# Patient Record
Sex: Female | Born: 1935
Health system: Southern US, Community
[De-identification: ages and names within clinical notes are randomized; demographics above are authoritative.]

## PROBLEM LIST (undated history)

## (undated) DIAGNOSIS — G43909 Migraine, unspecified, not intractable, without status migrainosus: Secondary | ICD-10-CM

## (undated) DIAGNOSIS — N281 Cyst of kidney, acquired: Secondary | ICD-10-CM

## (undated) DIAGNOSIS — K562 Volvulus: Secondary | ICD-10-CM

## (undated) DIAGNOSIS — F3289 Other specified depressive episodes: Secondary | ICD-10-CM

## (undated) DIAGNOSIS — F329 Major depressive disorder, single episode, unspecified: Secondary | ICD-10-CM

## (undated) DIAGNOSIS — I10 Essential (primary) hypertension: Secondary | ICD-10-CM

## (undated) DIAGNOSIS — I739 Peripheral vascular disease, unspecified: Secondary | ICD-10-CM

## (undated) DIAGNOSIS — J984 Other disorders of lung: Secondary | ICD-10-CM

## (undated) DIAGNOSIS — E785 Hyperlipidemia, unspecified: Secondary | ICD-10-CM

## (undated) DIAGNOSIS — K589 Irritable bowel syndrome without diarrhea: Secondary | ICD-10-CM

## (undated) DIAGNOSIS — I2510 Atherosclerotic heart disease of native coronary artery without angina pectoris: Secondary | ICD-10-CM

## (undated) DIAGNOSIS — Z86010 Personal history of colonic polyps: Secondary | ICD-10-CM

## (undated) DIAGNOSIS — N959 Unspecified menopausal and perimenopausal disorder: Secondary | ICD-10-CM

## (undated) DIAGNOSIS — R001 Bradycardia, unspecified: Secondary | ICD-10-CM

## (undated) DIAGNOSIS — K2960 Other gastritis without bleeding: Secondary | ICD-10-CM

## (undated) HISTORY — PX: PTCA: SHX146

## (undated) HISTORY — PX: VAGINAL HYSTERECTOMY: SHX2639

## (undated) HISTORY — DX: Essential (primary) hypertension: I10

## (undated) HISTORY — DX: Other gastritis without bleeding: K29.60

## (undated) HISTORY — PX: ABDOMINAL HYSTERECTOMY: SHX81

## (undated) HISTORY — DX: Personal history of colonic polyps: Z86.010

## (undated) HISTORY — PX: ESOPHAGOGASTRODUODENOSCOPY: SHX1529

## (undated) HISTORY — DX: Peripheral vascular disease, unspecified: I73.9

## (undated) HISTORY — DX: Volvulus: K56.2

## (undated) HISTORY — DX: Migraine, unspecified, not intractable, without status migrainosus: G43.909

## (undated) HISTORY — DX: Major depressive disorder, single episode, unspecified: F32.9

## (undated) HISTORY — DX: Hyperlipidemia, unspecified: E78.5

## (undated) HISTORY — DX: Other disorders of lung: J98.4

## (undated) HISTORY — PX: TONSILLECTOMY: SHX5217

## (undated) HISTORY — PX: TONSILLECTOMY: SUR1361

## (undated) HISTORY — DX: Cyst of kidney, acquired: N28.1

## (undated) HISTORY — DX: Irritable bowel syndrome, unspecified: K58.9

## (undated) HISTORY — DX: Other specified depressive episodes: F32.89

## (undated) HISTORY — DX: Unspecified menopausal and perimenopausal disorder: N95.9

## (undated) HISTORY — PX: COLONOSCOPY: SHX174

---

## 1969-10-12 HISTORY — PX: EXCISION MORTON'S NEUROMA: SHX5013

## 2004-10-12 DIAGNOSIS — Z860100 Personal history of colon polyps, unspecified: Secondary | ICD-10-CM

## 2004-10-12 DIAGNOSIS — K2960 Other gastritis without bleeding: Secondary | ICD-10-CM

## 2004-10-12 HISTORY — DX: Personal history of colon polyps, unspecified: Z86.0100

## 2004-10-12 HISTORY — DX: Other gastritis without bleeding: K29.60

## 2004-10-12 HISTORY — DX: Personal history of colonic polyps: Z86.010

## 2005-08-12 ENCOUNTER — Encounter: Payer: Self-pay | Admitting: Internal Medicine

## 2005-09-11 ENCOUNTER — Encounter: Payer: Self-pay | Admitting: Internal Medicine

## 2006-09-15 ENCOUNTER — Encounter: Payer: Self-pay | Admitting: Internal Medicine

## 2006-10-12 ENCOUNTER — Encounter: Payer: Self-pay | Admitting: Internal Medicine

## 2007-03-23 ENCOUNTER — Encounter: Payer: Self-pay | Admitting: Internal Medicine

## 2007-08-16 ENCOUNTER — Encounter: Payer: Self-pay | Admitting: Internal Medicine

## 2007-12-28 ENCOUNTER — Encounter: Payer: Self-pay | Admitting: Internal Medicine

## 2007-12-28 LAB — CONVERTED CEMR LAB
Eosinophils Relative: 2.3 %
Lymphocytes, automated: 22.2 %
Neutrophils Relative %: 66.6 %
Platelets: 175 10*3/uL
RBC: 4.14 M/uL
RDW: 13.2 %

## 2008-02-01 ENCOUNTER — Encounter: Payer: Self-pay | Admitting: Internal Medicine

## 2008-03-06 ENCOUNTER — Encounter: Payer: Self-pay | Admitting: Cardiovascular Disease

## 2008-03-14 ENCOUNTER — Encounter: Payer: Self-pay | Admitting: Internal Medicine

## 2008-03-30 ENCOUNTER — Encounter: Payer: Self-pay | Admitting: Internal Medicine

## 2008-04-30 ENCOUNTER — Encounter: Payer: Self-pay | Admitting: Internal Medicine

## 2008-04-30 LAB — CONVERTED CEMR LAB
AST: 38 units/L
Alkaline Phosphatase: 80 units/L
BUN: 21 mg/dL
Basophils Relative: 0.5 %
Calcium: 9.2 mg/dL
Creatinine, Ser: 1 mg/dL
Glucose, Bld: 90 mg/dL
Hemoglobin: 13.1 g/dL
Lymphocytes, automated: 26.1 %
Monocytes Relative: 8.4 %
Neutrophils Relative %: 61.9 %
Platelets: 217 10*3/uL
Potassium: 5.3 meq/L
Sodium: 141 meq/L
Total Protein: 7.3 g/dL
WBC: 4.8 10*3/uL

## 2008-05-01 ENCOUNTER — Encounter: Payer: Self-pay | Admitting: Internal Medicine

## 2008-05-15 ENCOUNTER — Encounter: Payer: Self-pay | Admitting: Internal Medicine

## 2008-06-19 ENCOUNTER — Encounter: Payer: Self-pay | Admitting: Internal Medicine

## 2008-08-15 ENCOUNTER — Encounter: Payer: Self-pay | Admitting: Internal Medicine

## 2008-08-15 LAB — CONVERTED CEMR LAB
ALT: 36 units/L
AST: 34 units/L
Alkaline Phosphatase: 60 units/L
BUN: 14 mg/dL
Chloride: 100 meq/L
Eosinophils Relative: 0.1 %
Glucose, Bld: 87 mg/dL
HDL: 62 mg/dL
Hemoglobin: 13.6 g/dL
Lymphocytes, automated: 19.9 %
Monocytes Relative: 8.4 %
Potassium: 4.4 meq/L
RDW: 12.5 %
Total Bilirubin: 0.6 mg/dL
Triglyceride fasting, serum: 181 mg/dL
WBC: 6.2 10*3/uL

## 2008-08-22 ENCOUNTER — Encounter: Payer: Self-pay | Admitting: Internal Medicine

## 2008-09-25 ENCOUNTER — Encounter: Payer: Self-pay | Admitting: Internal Medicine

## 2008-10-17 ENCOUNTER — Encounter: Payer: Self-pay | Admitting: Internal Medicine

## 2008-11-06 ENCOUNTER — Encounter: Payer: Self-pay | Admitting: Internal Medicine

## 2009-03-27 ENCOUNTER — Encounter: Payer: Self-pay | Admitting: Internal Medicine

## 2009-04-01 ENCOUNTER — Encounter: Payer: Self-pay | Admitting: Internal Medicine

## 2009-04-17 ENCOUNTER — Encounter: Payer: Self-pay | Admitting: Internal Medicine

## 2009-04-17 LAB — CONVERTED CEMR LAB
ALT: 37 units/L
AST: 35 units/L
Chloride: 100 meq/L
Cholesterol: 189 mg/dL
HDL: 92 mg/dL
LDL Cholesterol: 81 mg/dL
Lymphocytes, automated: 15.2 %
MCV: 86.5 fL
Monocytes Relative: 6.5 %
Neutrophils Relative %: 76.2 %
Platelets: 147 10*3/uL
Potassium: 4.3 meq/L
RDW: 12.4 %
Sodium: 140 meq/L

## 2009-07-24 ENCOUNTER — Encounter: Payer: Self-pay | Admitting: Cardiovascular Disease

## 2009-09-20 DIAGNOSIS — R9431 Abnormal electrocardiogram [ECG] [EKG]: Secondary | ICD-10-CM | POA: Insufficient documentation

## 2009-09-20 DIAGNOSIS — E785 Hyperlipidemia, unspecified: Secondary | ICD-10-CM | POA: Insufficient documentation

## 2009-09-20 DIAGNOSIS — I10 Essential (primary) hypertension: Secondary | ICD-10-CM | POA: Insufficient documentation

## 2009-09-23 ENCOUNTER — Ambulatory Visit: Payer: Self-pay | Admitting: Cardiovascular Disease

## 2009-09-23 DIAGNOSIS — R011 Cardiac murmur, unspecified: Secondary | ICD-10-CM | POA: Insufficient documentation

## 2009-09-23 DIAGNOSIS — M79609 Pain in unspecified limb: Secondary | ICD-10-CM | POA: Insufficient documentation

## 2009-10-09 ENCOUNTER — Encounter: Payer: Self-pay | Admitting: Cardiovascular Disease

## 2009-10-09 ENCOUNTER — Ambulatory Visit: Payer: Self-pay

## 2009-10-09 ENCOUNTER — Ambulatory Visit: Payer: Self-pay | Admitting: Cardiovascular Disease

## 2009-10-09 ENCOUNTER — Ambulatory Visit (HOSPITAL_COMMUNITY): Admission: RE | Admit: 2009-10-09 | Discharge: 2009-10-09 | Payer: Self-pay | Admitting: Cardiovascular Disease

## 2009-10-21 ENCOUNTER — Telehealth: Payer: Self-pay | Admitting: Cardiovascular Disease

## 2009-10-23 LAB — CONVERTED CEMR LAB
ALT: 48 units/L — ABNORMAL HIGH (ref 0–35)
AST: 39 units/L — ABNORMAL HIGH (ref 0–37)
Albumin: 3.9 g/dL (ref 3.5–5.2)
Alkaline Phosphatase: 44 units/L (ref 39–117)
CO2: 31 meq/L (ref 19–32)
Calcium: 9.2 mg/dL (ref 8.4–10.5)
Chloride: 102 meq/L (ref 96–112)
Glucose, Bld: 80 mg/dL (ref 70–99)
HDL: 84.6 mg/dL (ref >39.00)
Sodium: 140 meq/L (ref 135–145)
Total Protein: 6.9 g/dL (ref 6.0–8.3)

## 2009-11-12 ENCOUNTER — Ambulatory Visit: Payer: Self-pay | Admitting: Internal Medicine

## 2009-11-12 DIAGNOSIS — K5900 Constipation, unspecified: Secondary | ICD-10-CM | POA: Insufficient documentation

## 2009-11-12 DIAGNOSIS — L02619 Cutaneous abscess of unspecified foot: Secondary | ICD-10-CM | POA: Insufficient documentation

## 2009-11-12 DIAGNOSIS — G43909 Migraine, unspecified, not intractable, without status migrainosus: Secondary | ICD-10-CM | POA: Insufficient documentation

## 2009-11-12 DIAGNOSIS — Z9189 Other specified personal risk factors, not elsewhere classified: Secondary | ICD-10-CM | POA: Insufficient documentation

## 2009-11-12 DIAGNOSIS — N281 Cyst of kidney, acquired: Secondary | ICD-10-CM | POA: Insufficient documentation

## 2009-11-12 DIAGNOSIS — L03039 Cellulitis of unspecified toe: Secondary | ICD-10-CM

## 2009-11-12 LAB — CONVERTED CEMR LAB
Basophils Relative: 0.7 % (ref 0.0–3.0)
CRP, High Sensitivity: 0.3 (ref 0.00–5.00)
Eosinophils Absolute: 0.2 10*3/uL (ref 0.0–0.7)
Eosinophils Relative: 2.5 % (ref 0.0–5.0)
Hemoglobin: 12.7 g/dL (ref 12.0–15.0)
Lymphocytes Relative: 15.7 % (ref 12.0–46.0)
MCHC: 33.4 g/dL (ref 30.0–36.0)
Monocytes Relative: 7 % (ref 3.0–12.0)
Neutro Abs: 5.4 10*3/uL (ref 1.4–7.7)
Neutrophils Relative %: 74.1 % (ref 43.0–77.0)
RBC: 4.03 M/uL (ref 3.87–5.11)
WBC: 7.4 10*3/uL (ref 4.5–10.5)

## 2009-11-21 ENCOUNTER — Telehealth: Payer: Self-pay | Admitting: Internal Medicine

## 2009-11-27 ENCOUNTER — Encounter: Payer: Self-pay | Admitting: Internal Medicine

## 2009-11-27 ENCOUNTER — Encounter: Payer: Self-pay | Admitting: Cardiovascular Disease

## 2009-12-24 ENCOUNTER — Ambulatory Visit: Payer: Self-pay | Admitting: Cardiovascular Disease

## 2009-12-27 DIAGNOSIS — I739 Peripheral vascular disease, unspecified: Secondary | ICD-10-CM | POA: Insufficient documentation

## 2010-02-11 ENCOUNTER — Ambulatory Visit: Payer: Self-pay | Admitting: Internal Medicine

## 2010-02-11 DIAGNOSIS — N959 Unspecified menopausal and perimenopausal disorder: Secondary | ICD-10-CM | POA: Insufficient documentation

## 2010-03-26 ENCOUNTER — Ambulatory Visit: Payer: Self-pay | Admitting: Cardiovascular Disease

## 2010-03-26 DIAGNOSIS — I10 Essential (primary) hypertension: Secondary | ICD-10-CM | POA: Insufficient documentation

## 2010-03-26 DIAGNOSIS — I214 Non-ST elevation (NSTEMI) myocardial infarction: Secondary | ICD-10-CM | POA: Insufficient documentation

## 2010-03-31 ENCOUNTER — Encounter: Payer: Self-pay | Admitting: Cardiovascular Disease

## 2010-03-31 LAB — CONVERTED CEMR LAB
AST: 32 units/L (ref 0–37)
Alkaline Phosphatase: 47 units/L (ref 39–117)
Total Bilirubin: 0.7 mg/dL (ref 0.3–1.2)
Total CHOL/HDL Ratio: 2

## 2010-05-09 ENCOUNTER — Telehealth: Payer: Self-pay | Admitting: Internal Medicine

## 2010-05-22 ENCOUNTER — Telehealth: Payer: Self-pay | Admitting: Internal Medicine

## 2010-08-14 ENCOUNTER — Ambulatory Visit: Payer: Self-pay | Admitting: Internal Medicine

## 2010-08-14 DIAGNOSIS — J984 Other disorders of lung: Secondary | ICD-10-CM | POA: Insufficient documentation

## 2010-08-14 DIAGNOSIS — R490 Dysphonia: Secondary | ICD-10-CM | POA: Insufficient documentation

## 2010-08-14 DIAGNOSIS — R1084 Generalized abdominal pain: Secondary | ICD-10-CM | POA: Insufficient documentation

## 2010-08-15 ENCOUNTER — Encounter: Payer: Self-pay | Admitting: Internal Medicine

## 2010-08-15 ENCOUNTER — Ambulatory Visit: Payer: Self-pay | Admitting: Internal Medicine

## 2010-08-15 ENCOUNTER — Telehealth: Payer: Self-pay | Admitting: Internal Medicine

## 2010-08-15 LAB — CONVERTED CEMR LAB
CO2: 30 meq/L (ref 19–32)
Calcium: 9.3 mg/dL (ref 8.4–10.5)
Chloride: 101 meq/L (ref 96–112)
Glucose, Bld: 87 mg/dL (ref 70–99)
Sodium: 139 meq/L (ref 135–145)

## 2010-08-21 ENCOUNTER — Ambulatory Visit: Payer: Self-pay | Admitting: Cardiology

## 2010-09-11 ENCOUNTER — Encounter: Payer: Self-pay | Admitting: Internal Medicine

## 2010-09-29 ENCOUNTER — Encounter: Payer: Self-pay | Admitting: Internal Medicine

## 2010-10-08 ENCOUNTER — Encounter (INDEPENDENT_AMBULATORY_CARE_PROVIDER_SITE_OTHER): Payer: Self-pay | Admitting: *Deleted

## 2010-10-08 ENCOUNTER — Ambulatory Visit: Payer: Self-pay | Admitting: Internal Medicine

## 2010-10-08 DIAGNOSIS — R159 Full incontinence of feces: Secondary | ICD-10-CM | POA: Insufficient documentation

## 2010-10-08 DIAGNOSIS — Z860100 Personal history of colon polyps, unspecified: Secondary | ICD-10-CM | POA: Insufficient documentation

## 2010-10-08 DIAGNOSIS — Z8711 Personal history of peptic ulcer disease: Secondary | ICD-10-CM | POA: Insufficient documentation

## 2010-10-08 DIAGNOSIS — K649 Unspecified hemorrhoids: Secondary | ICD-10-CM | POA: Insufficient documentation

## 2010-10-08 DIAGNOSIS — K589 Irritable bowel syndrome without diarrhea: Secondary | ICD-10-CM | POA: Insufficient documentation

## 2010-10-23 ENCOUNTER — Telehealth: Payer: Self-pay | Admitting: Internal Medicine

## 2010-10-24 ENCOUNTER — Telehealth: Payer: Self-pay | Admitting: Internal Medicine

## 2010-11-05 ENCOUNTER — Encounter: Payer: Self-pay | Admitting: Internal Medicine

## 2010-11-11 NOTE — Procedures (Signed)
Summary: Colonoscopy/[facility]  Colonoscopy/[facility]   Imported By: Sherian Rein 11/14/2009 12:13:11  _____________________________________________________________________  External Attachment:    Type:   Image     Comment:   External Document

## 2010-11-11 NOTE — Assessment & Plan Note (Signed)
Summary: PER PT NOV APPT--[facility]   Vital Signs:  Patient profile:   75 year old female Height:      67 inches (170.18 cm) Weight:      148.12 pounds (67.33 kg) O2 Sat:      98 % on Room air Temp:     97.9 degrees F (36.61 degrees C) oral Pulse rate:   50 / minute BP sitting:   112 / 70  (right arm) Cuff size:   regular  Vitals Entered By: Orlan Leavens RMA (August 14, 2010 8:59 AM)  O2 Flow:  Room air CC: 4 month follow-up Is Patient Diabetic? No Pain Assessment Patient in pain? no      Comments Pt want to discuss Protonix she states not working   Primary Care Provider:  Newt Lukes MD  CC:  4 month follow-up.  History of Present Illness: here for f/u  HTN - reports compliance with ongoing medical treatment and no changes in medication dose or frequency. denies adverse side effects related to current therapy.   dyslipidemia - reports compliance with ongoing medical treatment and no changes in medication dose or frequency. denies adverse side effects related to current therapy. due for reck 03/2010 with cards for labs  postmenopausal syndrome- cont hot flashes, trying again "wean off" premarin - denies vaginal dryness or pain as primary issue - has been on premarin >23yrs for same - plans to not get refills to force herself to stop  c/o inc gas and need to f/u with GI (not done since in [place], prior GI notes reviewed from [place]) - occ "twist" sensation in RUQ - 1-2x/month - none now - no weight loss, no vomitting or change in bowels  c/o voice change - denies allg symptoms or vocal strain - symptoms worse in AM, normal voice by midday - +hx tobacco and would like ENT eval  also would like f/u lung spot - hx CT chest to look at "scar tissue" - would like repeat test - prior smoker   Preventive Screening-Counseling & Management  Alcohol-Tobacco     Alcohol drinks/day: <1     Alcohol Counseling: not indicated; use of alcohol is not excessive or problematic  Smoking Status: quit     Year Quit: 1990  Clinical Review Panels:  Lipid Management   Cholesterol:  179 (03/26/2010)   LDL (bad choesterol):  89 (03/26/2010)   HDL (good cholesterol):  77.80 (03/26/2010)   Triglycerides:  78 (04/17/2009)  CBC   WBC:  7.4 (11/12/2009)   RBC:  4.03 (11/12/2009)   Hgb:  12.7 (11/12/2009)   Hct:  38.1 (11/12/2009)   Platelets:  203.0 (11/12/2009)   MCV  94.3 (11/12/2009)   MCHC  33.4 (11/12/2009)   RDW  12.7 (11/12/2009)   PMN:  74.1 (11/12/2009)   Lymphs:  15.7 (11/12/2009)   Monos:  7.0 (11/12/2009)   Eosinophils:  2.5 (11/12/2009)   Basophil:  0.7 (11/12/2009)  Complete Metabolic Panel   Glucose:  80 (10/09/2009)   Sodium:  140 (10/09/2009)   Potassium:  4.0 (10/09/2009)   Chloride:  102 (10/09/2009)   CO2:  31 (10/09/2009)   BUN:  13 (10/09/2009)   Creatinine:  0.9 (10/09/2009)   Albumin:  3.9 (03/26/2010)   Total Protein:  6.6 (03/26/2010)   Calcium:  9.2 (10/09/2009)   Total Bili:  0.7 (03/26/2010)   Alk Phos:  47 (03/26/2010)   SGPT (ALT):  27 (03/26/2010)   SGOT (AST):  32 (03/26/2010)  Current Medications (verified): 1)  Simvastatin 40 Mg Tabs (Simvastatin) .... Take One Tablet By Mouth Daily At Bedtime 2)  Celexa 20 Mg Tabs (Citalopram Hydrobromide) .... Take 1 Tablet By Mouth Once A Day 3)  Protonix 40 Mg Tbec (Pantoprazole Sodium) .... Take 1 Tablet By Mouth Two Times A Day 4)  Aspirin 81 Mg Tbec (Aspirin) .... Take 1 Tablet By Mouth Two Times A Day 5)  Align  Caps (Probiotic Product) .... Take 1 Tablet By Mouth Once A Day 6)  Vitamin D3 1000 Unit Tabs (Cholecalciferol) .... Take 1 Tablet By Mouth Once A Day 7)  Folic Acid 800 Mcg Tabs (Folic Acid) .... Take 1 Tablet By Mouth Once A Day 8)  Zinc 75mg  .... Two Times A Day 9)  Fish Oil 1000 Mg Caps (Omega-3 Fatty Acids) .... Take 1 Capsule By Mouth Two Times A Day 10)  Caltrate 600 1500 Mg Tabs (Calcium Carbonate) .... Two Times A Day 11)  Vitamin C 1000 Mg Tabs (Ascorbic  Acid) .... Take 1 Tablet By Mouth Two Times A Day 12)  Losartan Potassium 50 Mg Tabs (Losartan Potassium) .... Take One Tablet By Mouth Daily 13)  Premarin 0.625 Mg Tabs (Estrogens Conjugated) .Marland Kitchen.. 1 By Mouth Once Daily  Allergies (verified): 1)  ! Codeine  Past History:  Past Medical History: hypertension dyslipidemia Colonic polyps, hx of migraines postmenopausal syndrome  MD roster: cards - Excell Seltzer podiatry - t[facility]man  Review of Systems  The patient denies anorexia, fever, weight loss, chest pain, syncope, and headaches.    Physical Exam  General:  alert, well-developed, well-nourished, and cooperative to examination.    Lungs:  normal respiratory effort, no intercostal retractions or use of accessory muscles; normal breath sounds bilaterally - no crackles and no wheezes.    Heart:  normal rate, regular rhythm, no murmur, and no rub. BLE without edema.  Abdomen:  soft, non-tender, normal bowel sounds, no distention; no masses and no appreciable hepatomegaly or splenomegaly.     Impression & Recommendations:  Problem # 1:  PULMONARY NODULE (ICD-518.89)  hx same since 2008 - reports 3 prior contrast scans w/o change = "scar tissue" no records available on this at this time - may need to recontact prior PCP - chest ct w/ cm ordered now at pt request given hx tobacco abuse-   Orders: Misc. Referral (Misc. Ref)  Problem # 2:  HOARSENESS (GUY-403.47)  tx for poss seasonal allg and refer to ENT at pt request due to smoking hx  Orders: ENT Referral (ENT)  Problem # 3:  ABDOMINAL PAIN, GENERALIZED (ICD-789.07)  prior GI eval in [place] reviewed in EMR records today -  refer for GI f/u now - ?IBS vs other no med changes rec  Orders: Gastroenterology Referral (GI)  Problem # 4:  ESSENTIAL HYPERTENSION, BENIGN (ICD-401.1)  Her updated medication list for this problem includes:    Losartan Potassium 50 Mg Tabs (Losartan potassium) .Marland Kitchen... Take one tablet by mouth  daily  BP today: 112/70 Prior BP: 110/70 (02/11/2010)  Labs Reviewed: K+: 4.0 (10/09/2009) Creat: : 0.9 (10/09/2009)   Chol: 179 (03/26/2010)   HDL: 77.80 (03/26/2010)   LDL: 89 (03/26/2010)   TG: 63.0 (03/26/2010)  Problem # 5:  POSTMENOPAUSAL SYNDROME (ICD-627.9)  Her updated medication list for this problem includes:    Premarin 0.625 Mg Tabs (Estrogens conjugated) .Marland Kitchen... 1/2-1 twice weekly (or as directed)   Discussed treatment options - cont wean premarin as ongoing -   Problem # 6:  HYPERLIPIDEMIA (ICD-272.4)  Her updated medication list for this problem includes:    Simvastatin 40 Mg Tabs (Simvastatin) .Marland Kitchen... Take one tablet by mouth daily at bedtime  s/p cards eval 03/2010 for same - excellent - cont same and f/u annually  Labs Reviewed: SGOT: 32 (03/26/2010)   SGPT: 27 (03/26/2010)   HDL:77.80 (03/26/2010), 84.60 (10/09/2009)  LDL:89 (03/26/2010), 68 (10/09/2009)  Chol:179 (03/26/2010), 166 (10/09/2009)  Trig:63.0 (03/26/2010), 67.0 (10/09/2009)  Complete Medication List: 1)  Simvastatin 40 Mg Tabs (Simvastatin) .... Take one tablet by mouth daily at bedtime 2)  Celexa 20 Mg Tabs (Citalopram hydrobromide) .... Take 1 tablet by mouth once a day 3)  Protonix 40 Mg Tbec (Pantoprazole sodium) .... Take 1 tablet by mouth two times a day 4)  Aspirin 81 Mg Tbec (Aspirin) .... Take 1 tablet by mouth two times a day 5)  Align Caps (Probiotic product) .... Take 1 tablet by mouth once a day 6)  Vitamin D3 1000 Unit Tabs (Cholecalciferol) .... Take 1 tablet by mouth once a day 7)  Folic Acid 800 Mcg Tabs (Folic acid) .... Take 1 tablet by mouth once a day 8)  Zinc 75mg   .... Two times a day 9)  Fish Oil 1000 Mg Caps (Omega-3 fatty acids) .... Take 1 capsule by mouth two times a day 10)  Caltrate 600 1500 Mg Tabs (Calcium carbonate) .... Two times a day 11)  Vitamin C 1000 Mg Tabs (Ascorbic acid) .... Take 1 tablet by mouth two times a day 12)  Losartan Potassium 50 Mg Tabs (Losartan  potassium) .... Take one tablet by mouth daily 13)  Premarin 0.625 Mg Tabs (Estrogens conjugated) .... 1/2-1 twice weekly (or as directed) 14)  Claritin 10 Mg Tabs (Loratadine) .Marland Kitchen.. 1 by mouth once daily x 2 weeks (then as needed for allergy and voice symptoms)  Other Orders: Radiology Referral (Radiology)  Patient Instructions: 1)  it was good to see you today. 2)  good luck weaning off premarin as discussed 3)  try claritin (or generic) for voice changes and will refer to ENT 4)  also we'll make referral to GI, for mammogram and for CT chest with contrast. Our office will contact you regarding these appointments once made.  5)  if medication refills needed before your next visit, call and we can prescribe as needed  6)  Please schedule a follow-up appointment in 4-6 months to monitor blood pressure and other issues, call sooner if problems.    Orders Added: 1)  Est. Patient Level IV [[address]109] 2)  Gastroenterology Referral [GI] 3)  ENT Referral [ENT] 4)  Misc. Referral [Misc. Ref] 5)  Radiology Referral [Radiology]   Immunization History:  Influenza Immunization History:    Influenza:  historical (07/12/2010)   Immunization History:  Influenza Immunization History:    Influenza:  Historical (07/12/2010)

## 2010-11-11 NOTE — Progress Notes (Signed)
Summary: Infection  Phone Note Call from Patient Call back at Home Phone [phone]   Caller: Patient Summary of Call: pt called stating that ABX for infected toe has helped but the infection is not entirely gone. Pt is requesting another ABX or a possible referral to Podiatry Initial call taken by: Margaret Pyle, CMA,  November 21, 2009 9:12 AM  Follow-up for Phone Call        ok to given another presciption refill on augmentin - will also make referral to podiatry - done Follow-up by: Newt Lukes MD,  November 21, 2009 9:48 AM  Additional Follow-up for Phone Call Additional follow up Details #1::        pt made aware of Rx and referral Additional Follow-up by: Margaret Pyle, CMA,  November 21, 2009 10:00 AM    Prescriptions: AUGMENTIN 875-125 MG TABS (AMOXICILLIN-POT CLAVULANATE) 1 by mouth two times a day x 10 days  #20 x 0   Entered by:   Margaret Pyle, CMA   Authorized by:   Newt Lukes MD   Signed by:   Margaret Pyle, CMA on 11/21/2009   Method used:   Electronically to        [facility] [address] [phone]* (retail)       [address]       [place], [place]  [address]       Ph: [phone] or [phone]       Fax: [phone]   RxID:   [id number]

## 2010-11-11 NOTE — Progress Notes (Signed)
Summary: needs labs for CT w/ contrast  ---- Converted from flag ---- ---- 08/15/2010 10:40 AM, Dagoberto Reef wrote: This pt need labs for CT.  Thanks ------------------------------  lucy - please place in IDX BMet (401.9) and notify pt of same - thanks Newt Lukes MD  August 15, 2010 11:24 AM       Additional Follow-up for Phone Call Additional follow up Details #2::    tried to contact pt left msg with work for her to RTC. Put order in IDX Follow-up by: Orlan Leavens RMA,  August 15, 2010 11:57 AM  Additional Follow-up for Phone Call Additional follow up Details #3:: Details for Additional Follow-up Action Taken: Pt return call back let her know need labs done prior to ct labs already in computer. Additional Follow-up by: Orlan Leavens RMA,  August 15, 2010 1:08 PM

## 2010-11-11 NOTE — Consult Note (Signed)
Summary: [facility]  [facility]   Imported By: Lester Ong 09/16/2010 09:10:31  _____________________________________________________________________  External Attachment:    Type:   Image     Comment:   External Document

## 2010-11-11 NOTE — Letter (Signed)
Summary: [facility]  [facility]   Imported By: Sherian Rein 11/14/2009 12:15:22  _____________________________________________________________________  External Attachment:    Type:   Image     Comment:   External Document

## 2010-11-11 NOTE — Letter (Signed)
Summary: Custom - Lipid  [facility], Main Office  [address]   [place], [place] [address]   Phone: [phone]  Fax: [phone]     March 31, 2010 MRN: [id number]   [facility] [address] [place], [place]  [address]   Dear Ms. [facility],  We have reviewed your cholesterol results.  They are as follows:     Total Cholesterol:    179 (Desirable: less than 200)       HDL  Cholesterol:     77.80  (Desirable: greater than 40 for men and 50 for women)       LDL Cholesterol:       89  (Desirable: less than 100 for low risk and less than 70 for moderate to high risk)       Triglycerides:       63.0  (Desirable: less than 150)  Our recommendations include: Cholesterol numbers and liver function are excellent.  You will need repeat labs in one year. Continue your current medications.   Call our office at the number listed above if you have any questions.  Lowering your LDL cholesterol is important, but it is only one of a large number of "risk factors" that may indicate that you are at risk for heart disease, stroke or other complications of hardening of the arteries.  Other risk factors include:   A.  Cigarette Smoking* B.  High Blood Pressure* C.  Obesity* D.   Low HDL Cholesterol (see yours above)* E.   Diabetes Mellitus (higher risk if your is uncontrolled) F.  Family history of premature heart disease G.  Previous history of stroke or cardiovascular disease    *These are risk factors YOU HAVE CONTROL OVER.  For more information, visit .  There is now evidence that lowering the TOTAL CHOLESTEROL AND LDL CHOLESTEROL can reduce the risk of heart disease.  The American Heart Association recommends the following guidelines for the treatment of elevated cholesterol:  1.  If there is now current heart disease and less than two risk factors, TOTAL CHOLESTEROL should be less than 200 and LDL CHOLESTEROL should be less than 100. 2.  If there is  current heart disease or two or more risk factors, TOTAL CHOLESTEROL should be less than 200 and LDL CHOLESTEROL should be less than 70.  A diet low in cholesterol, saturated fat, and calories is the cornerstone of treatment for elevated cholesterol.  Cessation of smoking and exercise are also important in the management of elevated cholesterol and preventing vascular disease.  Studies have shown that 30 to 60 minutes of physical activity most days can help lower blood pressure, lower cholesterol, and keep your weight at a healthy level.  Drug therapy is used when cholesterol levels do not respond to therapeutic lifestyle changes (smoking cessation, diet, and exercise) and remains unacceptably high.  If medication is started, it is important to have you levels checked periodically to evaluate the need for further treatment options.  Thank you,  Julieta Gutting,  RN-BSN [facility]

## 2010-11-11 NOTE — Progress Notes (Signed)
Summary: Results  Phone Note Call from Patient Call back at Home Phone [phone]   Caller: Patient Reason for Call: Talk to Nurse Summary of Call: returning call, would rather speak to Lauren, aware Lauren is out of ofc today Initial call taken by: Migdalia Dk,  October 21, 2009 2:58 PM  Follow-up for Phone Call        Left message to call back. Julieta Gutting, RN, BSN  October 23, 2009 11:13 AM  The pt called back and I gave her the results of Echo, LEA and bloodwork.  The pt requested a copy of her labs be mailed to her home.  The pt also wanted Dr Excell Seltzer to change her cholesterol medication due to cost.  The pt is currently taking Lipitor 20mg  and would like to change to a generic statin.  I will send this message to Dr Excell Seltzer for review. Follow-up by: Julieta Gutting, RN, BSN,  October 23, 2009 11:43 AM  Additional Follow-up for Phone Call Additional follow up Details #1::        please change to simvastatin 40 mg and f/u lipids/lft's 12 wks, thx Additional Follow-up by: Norva Karvonen, MD,  October 23, 2009 3:48 PM    Additional Follow-up for Phone Call Additional follow up Details #2::    I spoke with the pt and made her aware that Dr Excell Seltzer recommended having her change to Simvastatin 40mg .  Rx was sent to [facility].  I will schedule the pt for follow-up bloodwork at her next appt. Follow-up by: Julieta Gutting, RN, BSN,  October 24, 2009 11:26 AM  New/Updated Medications: SIMVASTATIN 40 MG TABS (SIMVASTATIN) Take one tablet by mouth daily at bedtime Prescriptions: SIMVASTATIN 40 MG TABS (SIMVASTATIN) Take one tablet by mouth daily at bedtime  #90 x 4   Entered by:   Julieta Gutting, RN, BSN   Authorized by:   Norva Karvonen, MD   Signed by:   Julieta Gutting, RN, BSN on 10/24/2009   Method used:   Electronically to        [facility]* (mail-order)             ,          Ph: [phone]       Fax: [phone]   RxID:   [id number]

## 2010-11-11 NOTE — Letter (Signed)
Summary: [facility]  [facility]   Imported By: Sherian Rein 11/14/2009 12:14:09  _____________________________________________________________________  External Attachment:    Type:   Image     Comment:   External Document

## 2010-11-11 NOTE — Procedures (Signed)
Summary: EGD/[facility]  EGD/[facility]   Imported By: Sherian Rein 11/14/2009 12:04:35  _____________________________________________________________________  External Attachment:    Type:   Image     Comment:   External Document

## 2010-11-11 NOTE — Procedures (Signed)
Summary: EGD/[facility]  EGD/[facility]   Imported By: Sherian Rein 11/14/2009 12:07:43  _____________________________________________________________________  External Attachment:    Type:   Image     Comment:   External Document

## 2010-11-11 NOTE — Assessment & Plan Note (Signed)
Summary: 3 month rov   Visit Type:  Follow-up Primary Provider:  Newt Lukes MD  CC:  Pt. is feeling better now.  History of Present Illness: This is a 75 year old woman, seen for follow-up of foot pain and purple toes. She complains of longstanding toe coolness and discoloration with cold exposure. She has had a pins and needles sensation in her feet as well. She is active with regular exercise and denies claudication symptoms. No edema or other complaints.  She has no history of cardiac problems. She denies chest pain, dyspnea, lightheadedness, palpations, orthopnea, or edema. She was started on Losartan at her last for visit for treatment of HTN and is tolerating this well.  Current Medications (verified): 1)  Simvastatin 40 Mg Tabs (Simvastatin) .... Take One Tablet By Mouth Daily At Bedtime 2)  Celexa 20 Mg Tabs (Citalopram Hydrobromide) .... Take 1 Tablet By Mouth Once A Day 3)  Protonix 40 Mg Tbec (Pantoprazole Sodium) .... Take 1 Tablet By Mouth Two Times A Day 4)  Aspirin 81 Mg Tbec (Aspirin) .... Take 1 Tablet By Mouth Two Times A Day 5)  Align  Caps (Probiotic Product) .... Take 1 Tablet By Mouth Once A Day 6)  Vitamin D3 1000 Unit Tabs (Cholecalciferol) .... Take 1 Tablet By Mouth Once A Day 7)  Folic Acid 800 Mcg Tabs (Folic Acid) .... Take 1 Tablet By Mouth Once A Day 8)  Zinc 75mg  .... Two Times A Day 9)  Fish Oil 1000 Mg Caps (Omega-3 Fatty Acids) .... Take 1 Capsule By Mouth Two Times A Day 10)  Caltrate 600 1500 Mg Tabs (Calcium Carbonate) .... Two Times A Day 11)  Vitamin C 1000 Mg Tabs (Ascorbic Acid) .... Take 1 Tablet By Mouth Two Times A Day 12)  Losartan Potassium 50 Mg Tabs (Losartan Potassium) .... Take One Tablet By Mouth Daily  Allergies: 1)  ! Codeine  Past History:  Past medical history reviewed for relevance to current acute and chronic problems.  Past Medical History: Reviewed history from 11/12/2009 and no changes  required. hypertension dyslipidemia Colonic polyps, hx of migraines  MD rooster: cards - Angeni Chaudhuri  Review of Systems       Negative except as per HPI   Vital Signs:  Patient profile:   75 year old female Height:      67 inches Weight:      145.50 pounds BMI:     22.87 Pulse rate:   60 / minute Pulse rhythm:   regular Resp:     18 per minute BP sitting:   110 / 68  (left arm) Cuff size:   large  Vitals Entered By: Vikki Ports (December 24, 2009 9:34 AM)  Serial Vital Signs/Assessments:  Time      Position  BP       Pulse  Resp  Temp     By           R Arm     110/70                         Vikki Ports   Physical Exam  General:  Pt is alert and oriented, very pleasant woman, in no acute distress. HEENT: normal Neck: normal carotid upstrokes without bruits, JVP normal Lungs: CTA CV: RRR without murmur or gallop Abd: soft, NT, positive BS, no bruit, no organomegaly Ext: peripheral pulses 2+ and equal, no edema, toes are cool, distal feet purplish discoloration Skin:  warm and dry without rash    ABI's  Procedure date:  10/09/2009  Findings:      ABI's and TBI's normal bilaterally  Impression & Recommendations:  Problem # 1:  PVD (ICD-443.9) ABI's and TBI's reviewed: ratio's and waveforms are normal. The pt has mild acrocyanosis and this has been longstanding. I suspect she has some degreee of small vessel vasospasm. We discussed the option of trying a vasodilating drug for her HTN, which may provide secondary benefit for her feet, but she does not want to switch because she is tolerating Losartan well and her BP is well-controlled. Will continue current Rx and see her back in 12 months.  Problem # 2:  HYPERLIPIDEMIA (ICD-272.4) Lipids excellent.  Due for repeat in June 2011.  Her updated medication list for this problem includes:    Simvastatin 40 Mg Tabs (Simvastatin) .Marland Kitchen... Take one tablet by mouth daily at bedtime  CHOL: 166 (10/09/2009)   LDL: 68  (10/09/2009)   HDL: 84.60 (10/09/2009)   TG: 67.0 (10/09/2009) CRP: 0.30 mg/L (11/12/2009)     Problem # 3:  HYPERTENSION, UNSPECIFIED (ICD-401.9) Well-controlled on losartan. BMET stable.  Her updated medication list for this problem includes:    Aspirin 81 Mg Tbec (Aspirin) .Marland Kitchen... Take 1 tablet by mouth two times a day    Losartan Potassium 50 Mg Tabs (Losartan potassium) .Marland Kitchen... Take one tablet by mouth daily  BP today: 110/68 Prior BP: 124/82 (11/12/2009)  Labs Reviewed: K+: 4.0 (10/09/2009) Creat: : 0.9 (10/09/2009)   Chol: 166 (10/09/2009)   HDL: 84.60 (10/09/2009)   LDL: 68 (10/09/2009)   TG: 67.0 (10/09/2009)  Patient Instructions: 1)  Your physician recommends that you return for a FASTING LIPID and LIVER in June 2011  (443.9, 401.1, 272.0) 2)  Your physician wants you to follow-up in:  1 YEAR.  You will receive a reminder letter in the mail two months in advance. If you don't receive a letter, please call our office to schedule the follow-up appointment. 3)  Your physician recommends that you continue on your current medications as directed. Please refer to the Current Medication list given to you today.

## 2010-11-11 NOTE — Procedures (Signed)
Summary: Colonoscopy/[facility]  Colonoscopy/[facility]   Imported By: Sherian Rein 11/14/2009 12:11:06  _____________________________________________________________________  External Attachment:    Type:   Image     Comment:   External Document

## 2010-11-11 NOTE — Progress Notes (Signed)
Summary: REFILL - [facility]  Phone Note Refill Request Message from:  Patient  Refills Requested: Medication #1:  PROTONIX 40 MG TBEC Take 1 tablet by mouth two times a day Ok to go to [facility] supply w/3rfs?  Patient is requesting a call back when complete.  Initial call taken by: Lamar Sprinkles, CMA,  May 09, 2010 11:53 AM  Follow-up for Phone Call        yes - ok to fill 90d as per request Newt Lukes MD  May 09, 2010 12:01 PM   Additional Follow-up for Phone Call Additional follow up Details #1::        lf mess for pt that rx was sent to pharm Additional Follow-up by: Lamar Sprinkles, CMA,  May 09, 2010 1:47 PM    Prescriptions: PROTONIX 40 MG TBEC (PANTOPRAZOLE SODIUM) Take 1 tablet by mouth two times a day  #180 x 3   Entered by:   Lamar Sprinkles, CMA   Authorized by:   Newt Lukes MD   Signed by:   Lamar Sprinkles, CMA on 05/09/2010   Method used:   Faxed to ...       [facility] (mail-order)             , [place]         Ph: [phone]       Fax: [phone]   RxID:   [id number]

## 2010-11-11 NOTE — Letter (Signed)
Summary: [facility]   [facility]   Imported By: Roderic Ovens 02/07/2010 11:32:23  _____________________________________________________________________  External Attachment:    Type:   Image     Comment:   External Document

## 2010-11-11 NOTE — Letter (Signed)
Summary: [facility]  [facility]   Imported By: Sherian Rein 11/14/2009 12:16:15  _____________________________________________________________________  External Attachment:    Type:   Image     Comment:   External Document

## 2010-11-11 NOTE — Progress Notes (Signed)
Summary: celexa  Phone Note Refill Request   Refills Requested: Medication #1:  CELEXA 20 MG TABS Take 1 tablet by mouth once a day REQ TO GO TO [facility]   Initial call taken by: Lamar Sprinkles, CMA,  May 22, 2010 10:25 AM  Follow-up for Phone Call        ok to fill Follow-up by: Newt Lukes MD,  May 22, 2010 10:43 AM    Prescriptions: CELEXA 20 MG TABS (CITALOPRAM HYDROBROMIDE) Take 1 tablet by mouth once a day  #90 x 3   Entered by:   Orlan Leavens RMA   Authorized by:   Newt Lukes MD   Signed by:   Orlan Leavens RMA on 05/22/2010   Method used:   Faxed to ...       [facility] (mail-order)             , [place]         Ph: [phone]       Fax: [phone]   RxID:   [id number]

## 2010-11-11 NOTE — Assessment & Plan Note (Signed)
Summary: NEW / MEDICARE Natale Milch FEE NOTICE MAILED/[facility]   Vital Signs:  Patient profile:   75 year old female Height:      67 inches (170.18 cm) Weight:      147.12 pounds (66.87 kg) O2 Sat:      97 % on Room air Temp:     97.6 degrees F (36.44 degrees C) oral Pulse rate:   57 / minute BP sitting:   124 / 82  (left arm) Cuff size:   regular  Vitals Entered By: Orlan Leavens (November 12, 2009 9:57 AM)  O2 Flow:  Room air CC: New patient/ Have sore on (L) toe want md to look at Is Patient Diabetic? No Pain Assessment Patient in pain? yes     Location: toe (L) foot Type: aching   Primary Care Provider:  Newt Lukes MD  CC:  New patient/ Have sore on (L) toe want md to look at.  History of Present Illness: new pt to me and our division - here to est care  c/o pain and swelling in left 4th toe onset >1 mo ago -  problem precipitated by wearing poor fitting snow boot during christmas day snow -  problem described as "rubbed hot spot" initially, now with red discoloration and swelling of entire toe problem a/w sig pain and throbbing - saw podiatrist (greggory mayer) apprx 2 weeks ago for same - took xray - told bone was ok and problem was microvascular - given ?NSAID pill helped control pain for a while but stopped taking - now painful again denies other joints or digits affected with these symptoms - no fever - no rash constantly cold feet but no numbness   Preventive Screening-Counseling & Management  Alcohol-Tobacco     Alcohol drinks/day: <1     Alcohol Counseling: not indicated; use of alcohol is not excessive or problematic     Smoking Status: quit     Year Quit: 1990  Caffeine-Diet-Exercise     Diet Counseling: not indicated; diet is assessed to be healthy     Does Patient Exercise: yes     Exercise Counseling: not indicated; exercise is adequate     Depression Counseling: not indicated; screening negative for depression  Safety-Violence-Falls     Seat  Belt Use: yes     Firearms in the Home: no firearms in the home     Firearm Counseling: not applicable     Smoke Detectors: yes     Violence in the Home: no risk noted     Violence Counseling: not applicable     Sexual Abuse: no     Sexual Abuse Counseling: n/a     Fall Risk Counseling: not indicated; no significant falls noted  Clinical Review Panels:  Prevention   Last Mammogram:  No specific mammographic evidence of malignancy.   (03/12/2009)   Last Pap Smear:  Interpretation/Result:Negative for intraepithelial Lesion or Malignancy.    (10/12/2006)   Last Colonoscopy:  Done @ Dr. Chrissie Noa brener in [facility] [facility] Results: Polyp, removed (10/12/2008)  Immunizations   Last Pneumovax:  Historical (10/13/2007)  Lipid Management   Cholesterol:  166 (10/09/2009)   LDL (bad choesterol):  68 (10/09/2009)   HDL (good cholesterol):  84.60 (10/09/2009)   Triglycerides:  78 (04/17/2009)  CBC   WBC:  8.0 (04/17/2009)   RBC:  4.42 (04/17/2009)   Hgb:  13.6 (04/17/2009)   Hct:  38.2 (04/17/2009)   Platelets:  147 (04/17/2009)   MCV  86.5 (  04/17/2009)   RDW  12.4 (04/17/2009)   PMN:  76.2 (04/17/2009)   Monos:  6.5 (04/17/2009)   Eosinophils:  1.8 (04/17/2009)   Basophil:  0.4 (04/17/2009)  Complete Metabolic Panel   Glucose:  80 (10/09/2009)   Sodium:  140 (10/09/2009)   Potassium:  4.0 (10/09/2009)   Chloride:  102 (10/09/2009)   CO2:  31 (10/09/2009)   BUN:  13 (10/09/2009)   Creatinine:  0.9 (10/09/2009)   Albumin:  3.9 (10/09/2009)   Total Protein:  6.9 (10/09/2009)   Calcium:  9.2 (10/09/2009)   Total Bili:  1.0 (10/09/2009)   Alk Phos:  44 (10/09/2009)   SGPT (ALT):  48 (10/09/2009)   SGOT (AST):  39 (10/09/2009)   -  Date:  04/17/2009    WBC: 8.0    HGB: 13.6    HCT: 38.2    RBC: 4.42    PLT: 147    MCV: 86.5    RDW: 12.4    Neutrophil: 76.2    Lymphs: 15.2    Monos: 6.5    Eos: 1.8    Basophil: 0.4    Triglycerides: 78  Date:  08/15/2008     WBC: 6.2    HGB: 13.6    HCT: 38.7    RBC: 4.38    MCV: 88.4    RDW: 12.5    Neutrophil: 69.6    Lymphs: 19.9    Monos: 8.4    Eos: 0.1    Basophil: 0.0    Triglycerides: 181    TSH: 1.656 6  Current Medications (verified): 1)  Simvastatin 40 Mg Tabs (Simvastatin) .... Take One Tablet By Mouth Daily At Bedtime 2)  Celexa 20 Mg Tabs (Citalopram Hydrobromide) .... Take 1 Tablet By Mouth Once A Day 3)  Protonix 40 Mg Tbec (Pantoprazole Sodium) .... Take 1 Tablet By Mouth Once A Day 4)  Aspirin 81 Mg Tbec (Aspirin) .... Take One Tablet By Mouth Daily 5)  Align  Caps (Probiotic Product) .... Take 1 Tablet By Mouth Once A Day 6)  Vitamin D3 1000 Unit Tabs (Cholecalciferol) .... Take 1 Tablet By Mouth Once A Day 7)  Folic Acid 800 Mcg Tabs (Folic Acid) .... Take 1 Tablet By Mouth Once A Day 8)  Zinc 75mg  .... Two Times A Day 9)  Fish Oil 1000 Mg Caps (Omega-3 Fatty Acids) .... Take 1 Capsule By Mouth Two Times A Day 10)  Caltrate 600 1500 Mg Tabs (Calcium Carbonate) .... Two Times A Day 11)  Vitamin C 1000 Mg Tabs (Ascorbic Acid) .... Take 1 Tablet By Mouth Two Times A Day 12)  Losartan Potassium 50 Mg Tabs (Losartan Potassium) .... Take One Tablet By Mouth Daily  Allergies (verified): 1)  ! Codeine  Past History:  Past Medical History: hypertension dyslipidemia Colonic polyps, hx of migraines  MD rooster: cards - [facility]  Social History: She quit smoking 20+ years ago.  She drinks occasional alcohol.  She is widowed fsince 2002.  She has 3 grown children and 5 grandchildren.  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 moved to [place] from [place] in 2010 to be close to family Does Patient Exercise:  yes Seat Belt Use:  yes  Review of Systems  The patient denies fever, weight loss, chest pain, syncope, headaches, and abdominal pain.    Physical Exam  General:  alert, well-developed, well-nourished, and cooperative to examination.    Lungs:  normal respiratory  effort, no intercostal retractions or use  of accessory muscles; normal breath sounds bilaterally - no crackles and no wheezes.    Heart:  normal rate, regular rhythm, no murmur, and no rub. BLE without edema.  Msk:  left foot, 4th toe diffusely swollen and mottled with purplish discoloration - very tender with any ROM or palpation - no open ulcerations or broken skin - mildly warm as compared to other digits - cool to touch but good cap refill - other toes normal Skin:  see MSkel re: L 4th toe - otherwise, no rashes, vesicles, ulcers, or erythema. No nodules or irregularity to palpation.  Psych:  Oriented X3, memory intact for recent and remote, normally interactive, good eye contact, not anxious appearing, not depressed appearing, and not agitated.      Impression & Recommendations:  Problem # 1:  CELLULITIS, TOE (ICD-681.10) concerning for skin infection following trauma -  pt reports normal xray done at podiatry < 2weeks ago- thus will not repeat at this time ok to cont NSAID for pain but add Augmentin for probable infection x 10d - check labs now to monitor -  conside eval by ortho/foot if not improving -- recent ABIs show good "macro" flow Her updated medication list for this problem includes:    Augmentin 875-125 Mg Tabs (Amoxicillin-pot clavulanate) .Marland Kitchen... 1 by mouth two times a day x 10 days  Orders: TLB-CBC Platelet - w/Differential (85025-CBCD) TLB-Sedimentation Rate (ESR) (85652-ESR) TLB-CRP-High Sensitivity (C-Reactive Protein) (86140-FCRP) Prescription Created Electronically [phone])  Complete Medication List: 1)  Simvastatin 40 Mg Tabs (Simvastatin) .... Take one tablet by mouth daily at bedtime 2)  Celexa 20 Mg Tabs (Citalopram hydrobromide) .... Take 1 tablet by mouth once a day 3)  Protonix 40 Mg Tbec (Pantoprazole sodium) .... Take 1 tablet by mouth two times a day 4)  Aspirin 81 Mg Tbec (Aspirin) .... Take one tablet by mouth daily 5)  Align Caps (Probiotic product) ....  Take 1 tablet by mouth once a day 6)  Vitamin D3 1000 Unit Tabs (Cholecalciferol) .... Take 1 tablet by mouth once a day 7)  Folic Acid 800 Mcg Tabs (Folic acid) .... Take 1 tablet by mouth once a day 8)  Zinc 75mg   .... Two times a day 9)  Fish Oil 1000 Mg Caps (Omega-3 fatty acids) .... Take 1 capsule by mouth two times a day 10)  Caltrate 600 1500 Mg Tabs (Calcium carbonate) .... Two times a day 11)  Vitamin C 1000 Mg Tabs (Ascorbic acid) .... Take 1 tablet by mouth two times a day 12)  Losartan Potassium 50 Mg Tabs (Losartan potassium) .... Take one tablet by mouth daily 13)  Augmentin 875-125 Mg Tabs (Amoxicillin-pot clavulanate) .Marland Kitchen.. 1 by mouth two times a day x 10 days  Patient Instructions: 1)  it was good to see you today.  2)  test(s) ordered today - your results will be posted on the phone tree for review in 48-72 hours from the time of test completion; call [phone] and enter your 9 digit MRN (listed above on this page, just below your name); if any changes need to be made or there are abnormal results, you will be contacted directly.  3)  antibiotics for your toe - Augmentin - your prescription has been electronically submitted to your pharmacy. Please take as directed. Contact our office if you believe you're having problems with the medication(s).  4)  if toe pain or swelling gets worse, rather than better, please let us know so we can make referral to  orthopedist for further evaluation and treatment 5)  otherwise, please schedule a follow-up appointment in 3 months, sooner if problems.  Prescriptions: AUGMENTIN 875-125 MG TABS (AMOXICILLIN-POT CLAVULANATE) 1 by mouth two times a day x 10 days  #20 x 0   Entered and Authorized by:   Newt Lukes MD   Signed by:   Newt Lukes MD on 11/12/2009   Method used:   Electronically to        [facility] [phone]* (retail)       [address]       [place], [place]  [address]       Ph:  [phone] or [phone]       Fax: [phone]   RxID:   [id number]    Colonoscopy  Procedure date:  10/12/2008  Findings:      Done @ Dr. Chrissie Noa brener in [place] [facility] Results: Polyp, removed   Bone Density  Procedure date:  07/12/2008  Findings:      done @ [facility], Dr. Roger Kill results: Osteopenia  Mammogram  Procedure date:  07/12/2008  Findings:      done @ [facility], [place] [facility] No specific mammographic evidence of malignancy.

## 2010-11-11 NOTE — Letter (Signed)
Summary: [facility]  [facility]   Imported By: Sherian Rein 11/14/2009 10:08:19  _____________________________________________________________________  External Attachment:    Type:   Image     Comment:   External Document

## 2010-11-11 NOTE — Letter (Signed)
Summary: [facility]   Imported By: Sherian Rein 12/09/2009 15:07:59  _____________________________________________________________________  External Attachment:    Type:   Image     Comment:   External Document

## 2010-11-11 NOTE — Letter (Signed)
Summary: New Patient letter  [facility]  [address] [place], [place] [address]   Phone: [phone]  Fax: [phone]       08/15/2010 MRN: [id number]  [facility] [address] [place], [place]  [address]  Dear Ms. [facility],  Welcome to the Gastroenterology Division at [facility].    You are scheduled to see Dr.  Leone Payor on 10-08-10 at [facility] on the 3rd floor at [facility], [address]  We ask that you try to arrive at our office 15 minutes prior to your appointment time to allow for check-in.  We would like you to complete the enclosed self-administered evaluation form prior to your visit and bring it with you on the day of your appointment.  We will review it with you.  Also, please bring a complete list of all your medications or, if you prefer, bring the medication bottles and we will list them.  Please bring your insurance card so that we may make a copy of it.  If your insurance requires a referral to see a specialist, please bring your referral form from your primary care physician.  Co-payments are due at the time of your visit and may be paid by cash, check or credit card.     Your office visit will consist of a consult with your physician (includes a physical exam), any laboratory testing he/she may order, scheduling of any necessary diagnostic testing (e.g. x-ray, ultrasound, CT-scan), and scheduling of a procedure (e.g. Endoscopy, Colonoscopy) if required.  Please allow enough time on your schedule to allow for any/all of these possibilities.    If you cannot keep your appointment, please call [phone] to cancel or reschedule prior to your appointment date.  This allows [place] the opportunity to schedule an appointment for another patient in need of care.  If you do not cancel or reschedule by 5 p.m. the business day prior to your appointment date, you will be charged a $50.00 late cancellation/no-show fee.    Thank you for  choosing [facility] for your medical needs.  We appreciate the opportunity to care for you.  Please visit [place] at our website  to learn more about our practice.                     Sincerely,                                                             The Gastroenterology Division

## 2010-11-11 NOTE — Assessment & Plan Note (Signed)
Summary: 3 MTH FU  STC   Vital Signs:  Patient profile:   75 year old female Height:      67 inches (170.18 cm) Weight:      147.12 pounds (66.87 kg) O2 Sat:      98 % on Room air Temp:     97.9 degrees F (36.61 degrees C) oral Pulse rate:   55 / minute BP sitting:   110 / 70  (left arm) Cuff size:   regular  Vitals Entered By: Orlan Leavens (Feb 11, 2010 9:00 AM)  O2 Flow:  Room air CC: 3 month follow-up Is Patient Diabetic? No Pain Assessment Patient in pain? no        Primary Care Provider:  Newt Lukes MD  CC:  3 month follow-up.  History of Present Illness: 3 mo f/u  f/u issues left 4th toe has seen both cards (PV) and podiatry for same since last OV - not vascular pain and swelling has resolved - remains discolored (min red) compared to other toes  HTN - reports compliance with ongoing medical treatment and no changes in medication dose or frequency. denies adverse side effects related to current therapy.   dyslipidemia - reports compliance with ongoing medical treatment and no changes in medication dose or frequency. denies adverse side effects related to current therapy. due for reck 03/2010 with cards for labs  postmenopausal syndrome- cont hot flashes, has been unable to "wean off" premarin - denies vaginal dryness or pain as primary issue - has been on premarin >71yrs for same and needs refills   Clinical Review Panels:  Lipid Management   Cholesterol:  166 (10/09/2009)   LDL (bad choesterol):  68 (10/09/2009)   HDL (good cholesterol):  84.60 (10/09/2009)   Triglycerides:  78 (04/17/2009)  CBC   WBC:  7.4 (11/12/2009)   RBC:  4.03 (11/12/2009)   Hgb:  12.7 (11/12/2009)   Hct:  38.1 (11/12/2009)   Platelets:  203.0 (11/12/2009)   MCV  94.3 (11/12/2009)   MCHC  33.4 (11/12/2009)   RDW  12.7 (11/12/2009)   PMN:  74.1 (11/12/2009)   Lymphs:  15.7 (11/12/2009)   Monos:  7.0 (11/12/2009)   Eosinophils:  2.5 (11/12/2009)   Basophil:  0.7  (11/12/2009)  Complete Metabolic Panel   Glucose:  80 (10/09/2009)   Sodium:  140 (10/09/2009)   Potassium:  4.0 (10/09/2009)   Chloride:  102 (10/09/2009)   CO2:  31 (10/09/2009)   BUN:  13 (10/09/2009)   Creatinine:  0.9 (10/09/2009)   Albumin:  3.9 (10/09/2009)   Total Protein:  6.9 (10/09/2009)   Calcium:  9.2 (10/09/2009)   Total Bili:  1.0 (10/09/2009)   Alk Phos:  44 (10/09/2009)   SGPT (ALT):  48 (10/09/2009)   SGOT (AST):  39 (10/09/2009)   Current Medications (verified): 1)  Simvastatin 40 Mg Tabs (Simvastatin) .... Take One Tablet By Mouth Daily At Bedtime 2)  Celexa 20 Mg Tabs (Citalopram Hydrobromide) .... Take 1 Tablet By Mouth Once A Day 3)  Protonix 40 Mg Tbec (Pantoprazole Sodium) .... Take 1 Tablet By Mouth Two Times A Day 4)  Aspirin 81 Mg Tbec (Aspirin) .... Take 1 Tablet By Mouth Two Times A Day 5)  Align  Caps (Probiotic Product) .... Take 1 Tablet By Mouth Once A Day 6)  Vitamin D3 1000 Unit Tabs (Cholecalciferol) .... Take 1 Tablet By Mouth Once A Day 7)  Folic Acid 800 Mcg Tabs (Folic Acid) .... Take  1 Tablet By Mouth Once A Day 8)  Zinc 75mg  .... Two Times A Day 9)  Fish Oil 1000 Mg Caps (Omega-3 Fatty Acids) .... Take 1 Capsule By Mouth Two Times A Day 10)  Caltrate 600 1500 Mg Tabs (Calcium Carbonate) .... Two Times A Day 11)  Vitamin C 1000 Mg Tabs (Ascorbic Acid) .... Take 1 Tablet By Mouth Two Times A Day 12)  Losartan Potassium 50 Mg Tabs (Losartan Potassium) .... Take One Tablet By Mouth Daily 13)  Premarin 0.625 Mg Tabs (Estrogens Conjugated) .... Take 1 Po Qd  Allergies (verified): 1)  ! Codeine  Past History:  Past Medical History: hypertension dyslipidemia Colonic polyps, hx of migraines psotmenopausal syndrome  MD rooster: cards - [facility] - [facility]  Review of Systems  The patient denies anorexia, weight loss, chest pain, syncope, abdominal pain, and muscle weakness.    Physical Exam  General:  alert, well-developed,  well-nourished, and cooperative to examination.    Lungs:  normal respiratory effort, no intercostal retractions or use of accessory muscles; normal breath sounds bilaterally - no crackles and no wheezes.    Heart:  normal rate, regular rhythm, no murmur, and no rub. BLE without edema.  Msk:  left foot, 4th toe without swelling, very mild erythema discoloration at distal tip - non tender, FROM - no open ulcerations or broken skin - all toes with good cap refill   Impression & Recommendations:  Problem # 1:  CELLULITIS, TOE (ICD-681.10) Assessment Improved podiatry notes and cards eval reviewed - issue resolved -  Problem # 2:  HYPERTENSION, UNSPECIFIED (ICD-401.9) Assessment: Unchanged  Her updated medication list for this problem includes:    Losartan Potassium 50 Mg Tabs (Losartan potassium) .Marland Kitchen... Take one tablet by mouth daily  BP today: 110/70 Prior BP: 110/68 (12/24/2009)  Labs Reviewed: K+: 4.0 (10/09/2009) Creat: : 0.9 (10/09/2009)   Chol: 166 (10/09/2009)   HDL: 84.60 (10/09/2009)   LDL: 68 (10/09/2009)   TG: 67.0 (10/09/2009)  Problem # 3:  HYPERLIPIDEMIA (ICD-272.4)  Her updated medication list for this problem includes:    Simvastatin 40 Mg Tabs (Simvastatin) .Marland Kitchen... Take one tablet by mouth daily at bedtime  Lipids excellent.  Due for repeat in June 2011 - scheduled with cards.  Her updated medication list for this problem includes:    Simvastatin 40 Mg Tabs (Simvastatin) .Marland Kitchen... Take one tablet by mouth daily at bedtime  CHOL: 166 (10/09/2009)   LDL: 68 (10/09/2009)   HDL: 84.60 (10/09/2009)   TG: 67.0 (10/09/2009) CRP: 0.30 mg/L (11/12/2009)     Problem # 4:  POSTMENOPAUSAL SYNDROME (ICD-627.9)  Her updated medication list for this problem includes:    Premarin 0.625 Mg Tabs (Estrogens conjugated) .Marland Kitchen... 1 by mouth once daily  Discussed treatment options - cont oral premarin as ongoing - new rx provided today.   Time spent with patient 25 minutes, more than 50%  of this time was spent counseling patient on evaluation for toe, effects of current medications and alternative options for tx of hot flashes  Complete Medication List: 1)  Simvastatin 40 Mg Tabs (Simvastatin) .... Take one tablet by mouth daily at bedtime 2)  Celexa 20 Mg Tabs (Citalopram hydrobromide) .... Take 1 tablet by mouth once a day 3)  Protonix 40 Mg Tbec (Pantoprazole sodium) .... Take 1 tablet by mouth two times a day 4)  Aspirin 81 Mg Tbec (Aspirin) .... Take 1 tablet by mouth two times a day 5)  Align Caps (Probiotic product) .Marland KitchenMarland KitchenMarland Kitchen  Take 1 tablet by mouth once a day 6)  Vitamin D3 1000 Unit Tabs (Cholecalciferol) .... Take 1 tablet by mouth once a day 7)  Folic Acid 800 Mcg Tabs (Folic acid) .... Take 1 tablet by mouth once a day 8)  Zinc 75mg   .... Two times a day 9)  Fish Oil 1000 Mg Caps (Omega-3 fatty acids) .... Take 1 capsule by mouth two times a day 10)  Caltrate 600 1500 Mg Tabs (Calcium carbonate) .... Two times a day 11)  Vitamin C 1000 Mg Tabs (Ascorbic acid) .... Take 1 tablet by mouth two times a day 12)  Losartan Potassium 50 Mg Tabs (Losartan potassium) .... Take one tablet by mouth daily 13)  Premarin 0.625 Mg Tabs (Estrogens conjugated) .Marland Kitchen.. 1 by mouth once daily  Patient Instructions: 1)  it was good to see you today. 2)  as there is no good generic for Premarin, will cont same as discussed - prescription faxed to [facility] - 3)  if other medication refills needed before your next visit, call and we can prescribe as needed  4)  Please schedule a follow-up appointment in 4-6 months to monitor blood pressure and other issues, call sooner if problems.  Prescriptions: PREMARIN 0.625 MG TABS (ESTROGENS CONJUGATED) 1 by mouth once daily  #90 x 3   Entered and Authorized by:   Newt Lukes MD   Signed by:   Newt Lukes MD on 02/11/2010   Method used:   Electronically to        [facility] (mail-order)             ,          Ph: [phone]        Fax: [phone]   RxID:   [id number]

## 2010-11-13 NOTE — Miscellaneous (Signed)
Summary: removal of flowsheet oberservations per dottie nelson  Clinical Lists Changes  Observations: Removed observation of COLONOSCOPY: Done @ Dr. Chrissie Noa brener in [place] [facility] Results: Polyp, removed  (10/12/2008 9:59) Removed observation of EGD: Erosive gastritis/ulcers - body and antrum  gastritis on biopsies and no H. pylori (07/02/2008 13:10)

## 2010-11-13 NOTE — Assessment & Plan Note (Signed)
Summary: GENERALIZED ABD PAIN/YF   History of Present Illness Visit Type: Initial Consult Primary GI MD: Stan Head MD [facility] Primary Luvinia Lucy: Newt Lukes MD Requesting Alda Gaultney: Newt Lukes MD Chief Complaint: Rectal bleeding History of Present Illness:   75yo white woman with intermittent lower abdominal discomfort "as if something is twisted" at times and sometimes a pressure under ribs when she bends. These lower abdominal symptoms are brief symptoms and without an abvious trigger. 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  Bloating or gas are chronic especially passing flatus. She has tried diet change and she stopped the Protonix since moving to [place]. She does not notice any problems. Gastritis/ulcers in 2009 were attributed to NSAID's/ASA in 2009 due to shingles.  Has been on Align since 2009 and no changes in gas with that.  She is having rectal bleeding that is painless with slight staining of underwear and toilet paper with bright red blood. She wears a pad. She has some relatively new mild leakage of feces that is soft and liquid. She has some stress urinary incontinence also with progressive worsening. Denies birth trauma. Ovaries remain. Formed bowel movements on MiraLax daily.   GI Review of Systems    Reports bloating.      Denies abdominal pain, acid reflux, belching, chest pain, dysphagia with liquids, dysphagia with solids, heartburn, loss of appetite, nausea, vomiting, vomiting blood, weight loss, and  weight gain.      Reports constipation, irritable bowel syndrome, and  rectal bleeding.     Denies anal fissure, black tarry stools, change in bowel habit, diarrhea, diverticulosis, fecal incontinence, heme positive stool, hemorrhoids, jaundice, light color stool, liver problems, and  rectal pain.    Clinical Reports Reviewed:  EGD:  11/06/2008:  Impression: 1. Normal esophagus 2. Moderately erythematous mucosa found in the antrum. Two  biopsy taken 3. Normal duodenal bulb, 2nd portion of the duodenum, and distal duodenum  07/02/2008:  Erosive gastritis/ulcers - body and antrum  gastritis on biopsies and no H. pylori  05/01/2008:  done in [facility] Impreesion: 1. Normal esophagus 2. there were several gastric antral and body ulcers and erosions with associated erythema, multiple biopsies taken  Colonoscopy:  10/12/2008:  Done @ Dr. Chrissie Noa brener in [facility]. Results: Polyp, removed  02/01/2008:  done in [facility] Results: Internal hemorrhoids  09/11/2005:  done in [facility] Impression: 1. One single colonic polyp 2. Mild melaosis found in the entire colon 3. Internal  hemorrhoids  EGD  Procedure date:  07/02/2008  Findings:      Erosive gastritis/ulcers - body and antrum  gastritis on biopsies and no H. pylori  Procedures Next Due Date:    Colonoscopy: 02/2013   Current Medications (verified): 1)  Simvastatin 40 Mg Tabs (Simvastatin) .... Take One Tablet By Mouth Daily At Bedtime 2)  Celexa 20 Mg Tabs (Citalopram Hydrobromide) .... Take 1 Tablet By Mouth Once A Day 3)  Aspirin 81 Mg Tbec (Aspirin) .... Take 1 Tablet By Mouth Two Times A Day 4)  Align  Caps (Probiotic Product) .... Take 1 Tablet By Mouth Once A Day 5)  Vitamin D3 1000 Unit Tabs (Cholecalciferol) .... Take 1 Tablet By Mouth Once A Day 6)  Folic Acid 800 Mcg Tabs (Folic Acid) .... Take 1 Tablet By Mouth Once A Day 7)  Zinc 75mg  .... Two Times A Day 8)  Fish Oil 1000 Mg Caps (Omega-3 Fatty Acids) .... Take 1 Capsule By Mouth Two Times A  Day 9)  Caltrate 600 1500 Mg Tabs (Calcium Carbonate) .... Two Times A Day 10)  Vitamin C 1000 Mg Tabs (Ascorbic Acid) .... Take 1 Tablet By Mouth Two Times A Day 11)  Losartan Potassium 50 Mg Tabs (Losartan Potassium) .... Take One Tablet By Mouth Daily 12)  Claritin 10 Mg Tabs (Loratadine) .Marland Kitchen.. 1 By Mouth Once Daily X 2 Weeks (Then As Needed For Allergy and Voice  Symptoms)  Allergies: 1)  ! Codeine  Past History:  Past Medical History: hypertension dyslipidemia Colonic polyps, hx of migraines postmenopausal syndrome IBS Erosive gastritis 2009   MD roster: cards - Excell S[facility] podiatry - tuchman  Past Surgical History: Reviewed history from 11/12/2009 and no changes required. Hysterectomy (1971) Tonsillectomy (child)  Family History: Father deceased at the age of 32 from MI. Mother deceased at age 3 from an MI.  She has a brother deceased at age 24 from MI as well. Father as well.  Family History Diabetes 1st degree relative (mother), paternal grandmother No FH of Colon Cancer:  Social History: She quit smoking 20+ years ago.  She drinks occasional alcohol. 1 per day She is widowed since 2002.  She has 3 grown children and 5 grandchildren.  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 moved to [place] from [place] in 2010 to be close to family Daily Caffeine Use 1 per day  Review of Systems       The patient complains of urine leakage and voice change.         All other ROS negative except as per HPI.   Vital Signs:  Patient profile:   75 year old female Height:      67 inches Weight:      149.2 pounds BMI:     23.45 Pulse rate:   60 / minute Pulse rhythm:   regular BP sitting:   112 / 60  (left arm) Cuff size:   regular  Vitals Entered By: Harlow Mares CMA Duncan Dull) (October 08, 2010 8:53 AM)  Physical Exam  General:  Well developed, well nourished, no acute distress. Eyes:  PERRLA, no icterus. Mouth:  No deformity or lesions. Neck:  Supple; no masses or thyromegaly. Lungs:  Clear throughout to auscultation. Heart:  Regular rate and rhythm; no murmurs, rubs,  or bruits. Abdomen:  Soft, nontender and nondistended. No masses, hepatosplenomegaly or hernias noted. Normal bowel sounds. Rectal:  female staff present: normal anoderm intact anal wink slight decrease in resting and voluntary squeeze no abnormal  descent small rectocele soft brown stool ANOSCOPY: swollen hemorrhoids in canal Extremities:  No clubbing, cyanosis, edema or deformities noted. Neurologic:  Alert and  oriented x4;  grossly normal neurologically. Cervical Nodes:  No significant cervical or supraclavicular adenopathy.  Psych:  Alert and cooperative. Normal mood and affect.   Impression & Recommendations:  Problem # 1:  HEMORRHOIDS, WITH BLEEDING (ICD-455.8) Assessment New History and exam support these as a cause of bleeding. have been seen on colonoscopies in 2006 and 2009. NOTE that she did not have a colonoscopy in 2010 as entered into flowsheet, last in 2009. Will treat for these and reassess. Do not think needs more aggressive evaluation at this time.  Problem # 2:  IRRITABLE BOWEL SYNDROME (ICD-564.1) Assessment: New Try gas diet  handout to see if that helps relieve some of this. She was advised it is normal to pass flatus 15-20 times a day. She will stop Align as has been on since 2009 and not  helping (apparently) Monitor abdominal complants - do not sound seious. contiue MiraLax for constipation - considered fiber for incontinence but concerned about aggravating gas complaints  breath testing negative for small bowel bacterial overgrowth in 2010  Problem # 3:  COLONIC POLYPS, ADENOMATOUS, HX OF (ICD-V12.72) colonoscopies in [place] then [place]. polyps in past 2000 or earlier diminutive adenoma removed 09/11/2005 nop polyps 02/01/08 DID NOT HAVE A COLONOSCOPY IN JAN 201 as entered into flowsheet - will see if it can be removed  recall colonoscopy 01/2013 is planned  Problem # 4:  FULL INCONTINENCE OF FECES (ICD-787.60) treat hemorrhoids to see if that helps (? mucoid ooze) Kegel Exercises see GYN also to assess genitalia re: could she need estrogen therapy (topical)  Problem # 5:  PERSONAL HISTORY OF PEPTIC ULCER DISEASE (ICD-V12.71) Assessment: New she had erosive gastritris in 2009 - while  using ASA/NSAID's for shingles pain follow-up EGD negative has been negative for H. pylori on bx twice and serology once   will see how she does off PPI at this time. Not absolutely indicated. Discussed possibility of bleeding. She is not using extra NSAID's, on 162 mg ASA daily.  Patient Instructions: 1)  Please pick up your medications at your pharmacy. 2)  Anusol HC is new treatment. 3)  Use the Kegel exercises and see a gynecologist as discussed. 4)  Read and follow gas handout. 5)  Read IBS handout. 6)  You may stop the Align. 7)  Please schedule a follow-up appointment in 6 to 8 weeks.  8)  Copy sent to : Dr Felicity Coyer 9)  The medication list was reviewed and reconciled.  All changed / newly prescribed medications were explained.  A complete medication list was provided to the patient / caregiver. Prescriptions: HEMORRHOIDAL-HC 25 MG SUPP (HYDROCORTISONE ACETATE) 1 per rectum nightly x 7 nights then as needed  #21 x 0   Entered and Authorized by:   Iva Boop MD, [facility]   Signed by:   Iva Boop MD, [facility] on 10/08/2010   Method used:   Electronically to        [facility] [address] [phone][facility] (retail)       [address]       [place], [place]  [address]       Ph: [phone] or [phone]       Fax: [phone]   RxID:   [id number]   Appended Document: GENERALIZED ABD PAIN/YF  ---- 10/08/2010 1:24 PM, Iva Boop MD, [facility] wrote: 1) Needs IDX colonoscopy recall for April 2014 2) see if EMR team can remove the 2010 colonoscopy entered into flowsheet - that is a mistake, also see if they can remove the duplicate 01/2008 EGD report  Clinical Lists Changes  Observations: Added new observation of COLONNXTDUE: 01/2013 (10/08/2010 13:32)  Recall entered into IDX for April 2014. Dottie Nelson-Smith CMA Duncan Dull)  October 08, 2010 1:35 PM

## 2010-11-13 NOTE — Progress Notes (Signed)
Summary: Referral  Phone Note Call from Patient Call back at Home Phone [phone]   Caller: Patient Summary of Call: Pt called requesting referral to Dr Meredeth Ide at [facility] for routine visit.  Initial call taken by: Margaret Pyle, CMA,  October 23, 2010 10:57 AM  Follow-up for Phone Call        done Follow-up by: Newt Lukes MD,  October 23, 2010 12:33 PM  Additional Follow-up for Phone Call Additional follow up Details #1::        Pt informed via home VM, told to expect call from [facility] with appt info Additional Follow-up by: Margaret Pyle, CMA,  October 23, 2010 1:35 PM

## 2010-11-13 NOTE — Progress Notes (Signed)
Summary: Gyn  Referral  Phone Note From Other Clinic   Summary of Call: Dr Felicity Coyer, Dr Tresa Res office is not taking new medicare patients they will need office note showing that pt has been  seen  for this problem before they will see her. Initial call taken by: Dagoberto Reef,  October 24, 2010 10:33 AM  Follow-up for Phone Call        i do not do gynecology care so pt has not been seen her for routine gyn visit - see phone note re: pt's request for this provider - if pt cannot be seen by her requested MD, we can arrange for another gyn to eval for routine - thanks Follow-up by: Newt Lukes MD,  October 24, 2010 1:06 PM  Additional Follow-up for Phone Call Additional follow up Details #1::        left message on machine for pt to return my call. Margaret Pyle, CMA  October 28, 2010 2:11 PM   Pt is okay with referral to any other female GYN (not OB/GYN) near her home. Additional Follow-up by: Margaret Pyle, CMA,  October 29, 2010 9:39 AM    Additional Follow-up for Phone Call Additional follow up Details #2::    order requested -Newt Lukes MD  October 29, 2010 12:05 PM

## 2010-11-19 NOTE — Consult Note (Signed)
Summary: [facility]  [facility]   Imported By: Sherian Rein 11/12/2010 14:45:58  _____________________________________________________________________  External Attachment:    Type:   Image     Comment:   External Document

## 2010-11-25 ENCOUNTER — Encounter: Payer: Self-pay | Admitting: Internal Medicine

## 2010-11-25 ENCOUNTER — Ambulatory Visit (INDEPENDENT_AMBULATORY_CARE_PROVIDER_SITE_OTHER): Payer: Medicare Other | Admitting: Internal Medicine

## 2010-11-25 DIAGNOSIS — K589 Irritable bowel syndrome without diarrhea: Secondary | ICD-10-CM

## 2010-11-25 DIAGNOSIS — R159 Full incontinence of feces: Secondary | ICD-10-CM

## 2010-11-25 DIAGNOSIS — K649 Unspecified hemorrhoids: Secondary | ICD-10-CM

## 2010-11-27 ENCOUNTER — Encounter: Payer: Self-pay | Admitting: Internal Medicine

## 2010-12-03 NOTE — Letter (Signed)
Summary: *Referral Letter  [facility]  [address] [place], [place] [address]   Phone: [phone]  Fax: [phone]    11/27/2010 Claud Kelp, MD [facility] [place]  Dear Mikey Bussing:  Thank you in advance for agreeing to see my patient:  Michele Burns [address] [place], [place]  [address]  Phone: [phone]  Reason for Referral: bleeding hemorrhoids  Procedures Requested: evaluate and manage (treat)  Current Medications: 1)  SIMVASTATIN 40 MG TABS (SIMVASTATIN) Take one tablet by mouth daily at bedtime 2)  CELEXA 20 MG TABS (CITALOPRAM HYDROBROMIDE) Take 1 tablet by mouth once a day 3)  ASPIRIN 81 MG TBEC (ASPIRIN) Take 1 tablet by mouth two times a day 4)  ALIGN  CAPS (PROBIOTIC PRODUCT) Take 1 tablet by mouth once a day 5)  VITAMIN D3 1000 UNIT TABS (CHOLECALCIFEROL) Take 1 tablet by mouth once a day 6)  * ZINC 75MG  two times a day 7)  FISH OIL 1000 MG CAPS (OMEGA-3 FATTY ACIDS) Take 1 capsule by mouth two times a day 8)  CALTRATE 600 1500 MG TABS (CALCIUM CARBONATE) two times a day 9)  VITAMIN C 1000 MG TABS (ASCORBIC ACID) Take 1 tablet by mouth two times a day 10)  LOSARTAN POTASSIUM 50 MG TABS (LOSARTAN POTASSIUM) take one tablet by mouth daily 11)  CLARITIN 10 MG TABS (LORATADINE) 1 by mouth once daily x 2 weeks (then as needed for allergy and voice symptoms) 12)  HEMORRHOIDAL-HC 25 MG SUPP (HYDROCORTISONE ACETATE) 1 per rectum nightly as needed  Past Medical History: 1)  hypertension 2)  dyslipidemia 3)  Colonic polyps, hx of 4)  migraines 5)  postmenopausal syndrome 6)  IBS 7)  Erosive gastritis 2009  8) bleeding hemorrhoids  Thank you again for agreeing to see our patient; please contact us if you have any further questions or need additional information.  Sincerely,  Ashley Murrain MD, [facility]

## 2010-12-03 NOTE — Assessment & Plan Note (Signed)
Summary: F/U [facility] -   History of Present Illness Visit Type: Follow-up Visit Primary GI MD: Stan Head MD [facility] Primary Provider: Dr Rene Paci Requesting Provider: Newt Lukes MD Chief Complaint: Patient here for f/u rectal bleeding, fecal leakage and increased gas. She states that she is still having all of these problems without any improvement. History of Present Illness:   75 yo ww first seen by me 10/08/10. Diagnosed with bleeding hemorrhouids and IBS based upon hx, anoscopy and prior colonoscopty records [facility]). She is using hydrocortisone suppositories everey second or third night and that controls bleeding. Mild anal seepage continues.  Say GYN and no problems found to sugggest atrophic vaginitis- note reviewed.  Does not want to continue taking supositories. she has chronic constipation but says no straining to stool or diffculties with that.     GI Review of Systems    Reports bloating.      Denies abdominal pain, acid reflux, belching, chest pain, dysphagia with liquids, dysphagia with solids, heartburn, loss of appetite, nausea, vomiting, vomiting blood, weight loss, and  weight gain.      Reports constipation, fecal incontinence, irritable bowel syndrome, and  rectal bleeding.     Denies anal fissure, black tarry stools, change in bowel habit, diarrhea, diverticulosis, heme positive stool, hemorrhoids, jaundice, light color stool, liver problems, and  rectal pain.    Current Medications (verified): 1)  Simvastatin 40 Mg Tabs (Simvastatin) .... Take One Tablet By Mouth Daily At Bedtime 2)  Celexa 20 Mg Tabs (Citalopram Hydrobromide) .... Take 1 Tablet By Mouth Once A Day 3)  Aspirin 81 Mg Tbec (Aspirin) .... Take 1 Tablet By Mouth Two Times A Day 4)  Align  Caps (Probiotic Product) .... Take 1 Tablet By Mouth Once A Day 5)  Vitamin D3 1000 Unit Tabs (Cholecalciferol) .... Take 1 Tablet By Mouth Once A Day 6)  Zinc 75mg  .... Two  Times A Day 7)  Fish Oil 1000 Mg Caps (Omega-3 Fatty Acids) .... Take 1 Capsule By Mouth Two Times A Day 8)  Caltrate 600 1500 Mg Tabs (Calcium Carbonate) .... Two Times A Day 9)  Vitamin C 1000 Mg Tabs (Ascorbic Acid) .... Take 1 Tablet By Mouth Two Times A Day 10)  Losartan Potassium 50 Mg Tabs (Losartan Potassium) .... Take One Tablet By Mouth Daily 11)  Claritin 10 Mg Tabs (Loratadine) .Marland Kitchen.. 1 By Mouth Once Daily X 2 Weeks (Then As Needed For Allergy and Voice Symptoms) 12)  Hemorrhoidal-Hc 25 Mg Supp (Hydrocortisone Acetate) .Marland Kitchen.. 1 Per Rectum Nightly As Needed  Allergies (verified): 1)  ! Codeine  Past History:  Past Medical History: Reviewed history from 10/08/2010 and no changes required. hypertension dyslipidemia Colonic polyps, hx of migraines postmenopausal syndrome IBS Erosive gastritis 2009   MD roster: cards - [facility] Seltzer podiatry - tuchman  Past Surgical History: Reviewed history from 11/12/2009 and no changes required. Hysterectomy (1971) Tonsillectomy (child)  Family History: Reviewed history from 10/08/2010 and no changes required. Father deceased at the age of 94 from MI. Mother deceased at age 13 from an MI.  She has a brother deceased at age 55 from MI as well. Father as well.  Family History Diabetes 1st degree relative (mother), paternal grandmother No FH of Colon Cancer:  Social History: She quit smoking 30+ years ago.  She drinks occasional alcohol. 1 per day She is widowed since 2002.  She has 3 grown children and 5 grandchildren.  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 moved to [place] from [place] in 2010 to be close to family Daily Caffeine Use 1-2 per day  Vital Signs:  Patient profile:   75 year old female Height:      67 inches Weight:      149 pounds BMI:     23.42 BSA:     1.79 Pulse rate:   52 / minute Pulse rhythm:   regular BP sitting:   116 / 64  (left arm)  Vitals Entered By: Lamona Curl CMA Duncan Dull) (November 25, 2010 10:04 AM)  Physical Exam  General:  Well developed, well nourished, no acute distress.   Impression & Recommendations:  Problem # 1:  HEMORRHOIDS, WITH BLEEDING (ICD-455.8) Assessment Improved We reviewed her situation. Based upon hx and prior exam its sopunds like the hemorrhoids prolapse occasionally and are reducible so suspect Grade III disease. Options for definitive tx reviewed: sclerosis, banding, surgery I have recommended a surgical consult. We discussed sigmoidoscopy to exclude other lesions including possible but unlikely polyps or cancer. with prior colonoscopies showing hemorrhoids, anoscopy findings last month, hx and respionse to [facility] suppositories hemorrhoid dx seems accurate. She is referred to Dr. Claud Kelp for evaluation and further management. 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 15+ minutes spent reviewing and discussing  Problem # 2:  FULL INCONTINENCE OF FECES (ICD-787.60) Assessment: Improved mild seepage continues and she says that is not a major issue  Problem # 3:  IRRITABLE BOWEL SYNDROME (ICD-564.1) Assessment: Unchanged she has continue align because she had purchased it. It does not seem to have helped her so she may stop. It does not sound like the IBS is disturbing quality of life too much. Follow-up as needed.  Patient Instructions: 1)  We have referred you to Dr. Derrell Lolling at [facility]   Appointment is  2)  March 12th at 9:40 am and you are to arrive at 9:20 am.   3)  Copy sent to : Dr Rene Paci,  Dr. Claud Kelp, MD 4)  Please schedule a follow-up appointment as needed.  5)  The medication list was reviewed and reconciled.  All changed / newly prescribed medications were explained.  A complete medication list was provided to the patient / caregiver.

## 2010-12-16 ENCOUNTER — Encounter: Payer: Self-pay | Admitting: Cardiovascular Disease

## 2010-12-25 ENCOUNTER — Ambulatory Visit (INDEPENDENT_AMBULATORY_CARE_PROVIDER_SITE_OTHER): Payer: Medicare Other | Admitting: Internal Medicine

## 2010-12-25 ENCOUNTER — Encounter: Payer: Self-pay | Admitting: Internal Medicine

## 2010-12-25 DIAGNOSIS — N959 Unspecified menopausal and perimenopausal disorder: Secondary | ICD-10-CM

## 2010-12-25 DIAGNOSIS — F32A Depression, unspecified: Secondary | ICD-10-CM | POA: Insufficient documentation

## 2010-12-25 DIAGNOSIS — F3289 Other specified depressive episodes: Secondary | ICD-10-CM

## 2010-12-25 DIAGNOSIS — F329 Major depressive disorder, single episode, unspecified: Secondary | ICD-10-CM

## 2010-12-29 ENCOUNTER — Encounter: Payer: Self-pay | Admitting: Cardiovascular Disease

## 2010-12-29 ENCOUNTER — Ambulatory Visit (INDEPENDENT_AMBULATORY_CARE_PROVIDER_SITE_OTHER): Payer: Medicare Other | Admitting: Cardiovascular Disease

## 2010-12-29 DIAGNOSIS — I10 Essential (primary) hypertension: Secondary | ICD-10-CM

## 2010-12-29 DIAGNOSIS — E7800 Pure hypercholesterolemia, unspecified: Secondary | ICD-10-CM

## 2010-12-30 ENCOUNTER — Ambulatory Visit: Payer: Self-pay | Admitting: Cardiovascular Disease

## 2010-12-30 NOTE — Assessment & Plan Note (Signed)
Summary: 4 MO ROV//#  CD   Vital Signs:  Patient profile:   75 year old female Weight:      147.4 pounds (67 kg) O2 Sat:      96 % on Room air Temp:     98.2 degrees F (36.78 degrees C) oral Pulse rate:   53 / minute BP sitting:   112 / 62  (left arm) Cuff size:   regular  Vitals Entered By: Orlan Leavens RMA (December 25, 2010 9:22 AM)  O2 Flow:  Room air CC: 4 month follow-up Is Patient Diabetic? No Pain Assessment Patient in pain? no        Primary Care Provider:  Dr Rene Paci  CC:  4 month follow-up.  History of Present Illness: here for f/u  HTN - reports compliance with ongoing medical treatment and no changes in medication dose or frequency. denies adverse side effects related to current therapy.   dyslipidemia - reports compliance with ongoing medical treatment and no changes in medication dose or frequency. denies adverse side effects related to current therapy. follows with cards for same  postmenopausal syndrome- cont hot flashes, trying again "wean off" premarin - denies vaginal dryness or pain as primary issue - prev on premarin >51yrs for same - off since 10/2011but cont sweating - worse with weather change and emotional stress (lost job)    Clinical Review Panels:  Lipid Management   Cholesterol:  179 (03/26/2010)   LDL (bad choesterol):  89 (03/26/2010)   HDL (good cholesterol):  77.80 (03/26/2010)   Triglycerides:  78 (04/17/2009)  CBC   WBC:  7.4 (11/12/2009)   RBC:  4.03 (11/12/2009)   Hgb:  12.7 (11/12/2009)   Hct:  38.1 (11/12/2009)   Platelets:  203.0 (11/12/2009)   MCV  94.3 (11/12/2009)   MCHC  33.4 (11/12/2009)   RDW  12.7 (11/12/2009)   PMN:  74.1 (11/12/2009)   Lymphs:  15.7 (11/12/2009)   Monos:  7.0 (11/12/2009)   Eosinophils:  2.5 (11/12/2009)   Basophil:  0.7 (11/12/2009)  Complete Metabolic Panel   Glucose:  87 (08/15/2010)   Sodium:  139 (08/15/2010)   Potassium:  4.6 (08/15/2010)   Chloride:  101 (08/15/2010)  CO2:  30 (08/15/2010)   BUN:  22 (08/15/2010)   Creatinine:  0.9 (08/15/2010)   Albumin:  3.9 (03/26/2010)   Total Protein:  6.6 (03/26/2010)   Calcium:  9.3 (08/15/2010)   Total Bili:  0.7 (03/26/2010)   Alk Phos:  47 (03/26/2010)   SGPT (ALT):  27 (03/26/2010)   SGOT (AST):  32 (03/26/2010)   Current Medications (verified): 1)  Simvastatin 40 Mg Tabs (Simvastatin) .... Take One Tablet By Mouth Daily At Bedtime 2)  Celexa 20 Mg Tabs (Citalopram Hydrobromide) .... Take 1 Tablet By Mouth Once A Day 3)  Aspirin 81 Mg Tbec (Aspirin) .... Take 1 Tablet By Mouth Two Times A Day 4)  Align  Caps (Probiotic Product) .... Take 1 Tablet By Mouth Once A Day 5)  Vitamin D3 1000 Unit Tabs (Cholecalciferol) .... Take 1 Tablet By Mouth Once A Day 6)  Zinc 75mg  .... Two Times A Day 7)  Fish Oil 1000 Mg Caps (Omega-3 Fatty Acids) .... Take 1 Capsule By Mouth Two Times A Day 8)  Caltrate 600 1500 Mg Tabs (Calcium Carbonate) .... Two Times A Day 9)  Vitamin C 1000 Mg Tabs (Ascorbic Acid) .... Take 1 Tablet By Mouth Two Times A Day 10)  Losartan Potassium 50 Mg Tabs (  Losartan Potassium) .... Take One Tablet By Mouth Daily 11)  Claritin 10 Mg Tabs (Loratadine) .Marland Kitchen.. 1 By Mouth Once Daily X 2 Weeks (Then As Needed For Allergy and Voice Symptoms) 12)  Hemorrhoidal-Hc 25 Mg Supp (Hydrocortisone Acetate) .Marland Kitchen.. 1 Per Rectum Nightly As Needed  Allergies (verified): 1)  ! Codeine  Past History:  Past Medical History: hypertension  dyslipidemia Colonic polyps, hx of migraines postmenopausal syndrome IBS Erosive gastritis 2009   MD roster: cards - [facility] podiatry - [facility] gyn - [facility] - [facility]  Social History: She quit smoking 30+ years ago.  She drinks occasional alcohol. 1 per day She is widowed since 2002.  She has 3 grown children and 5 grandchildren.  She prev worked at the [employer] a [employer] - laid off 10/2010 moved to [place] from [place] in 2010 to be close to family Daily  Caffeine Use 1-2 per day  Review of Systems  The patient denies fever, weight loss, weight gain, chest pain, and headaches.    Physical Exam  General:  alert, well-developed, well-nourished, and cooperative to examination.    Lungs:  normal respiratory effort, no intercostal retractions or use of accessory muscles; normal breath sounds bilaterally - no crackles and no wheezes.    Heart:  normal rate, regular rhythm, no murmur, and no rub. BLE without edema.  Psych:  Oriented X3, memory intact for recent and remote, normally interactive, good eye contact, not anxious appearing, not depressed appearing, and not agitated.      Impression & Recommendations:  Problem # 1:  DEPRESSION (ICD-311)  exac by stressors and hot flaash symptoms - off HRT since 07/2010 incr ssri after d/w pt re: possible change to snri or resuming low dose HRT - new erx done f/u 3 mo, sooner if problems Her updated medication list for this problem includes:    Citalopram Hydrobromide 40 Mg Tabs (Citalopram hydrobromide) .Marland Kitchen... 1 by mouth once daily  Orders: Prescription Created Electronically [id number])  Problem # 2:  POSTMENOPAUSAL SYNDROME (ICD-627.9)  Discussed treatment options - off premarin since 07/2010- will remain off due to inc CAD risk on tx and CAD hx  Time spent with patient 25 minutes, more than 50% of this time was spent counseling patient on depression, hot flashes and options for tx  Complete Medication List: 1)  Simvastatin 40 Mg Tabs (Simvastatin) .... Take one tablet by mouth daily at bedtime 2)  Citalopram Hydrobromide 40 Mg Tabs (Citalopram hydrobromide) .Marland Kitchen.. 1 by mouth once daily 3)  Aspirin 81 Mg Tbec (Aspirin) .... Take 1 tablet by mouth two times a day 4)  Align Caps (Probiotic product) .... Take 1 tablet by mouth once a day 5)  Vitamin D3 1000 Unit Tabs (Cholecalciferol) .... Take 1 tablet by mouth once a day 6)  Zinc 75mg   .... Two times a day 7)  Fish Oil 1000 Mg Caps (Omega-3 fatty  acids) .... Take 1 capsule by mouth two times a day 8)  Caltrate 600 1500 Mg Tabs (Calcium carbonate) .... Two times a day 9)  Vitamin C 1000 Mg Tabs (Ascorbic acid) .... Take 1 tablet by mouth two times a day 10)  Losartan Potassium 50 Mg Tabs (Losartan potassium) .... Take one tablet by mouth daily 11)  Claritin 10 Mg Tabs (Loratadine) .Marland Kitchen.. 1 by mouth once daily x 2 weeks (then as needed for allergy and voice symptoms) 12)  Hemorrhoidal-hc 25 Mg Supp (Hydrocortisone acetate) .Marland Kitchen.. 1 per rectum nightly as needed  Patient Instructions: 1)  it was good to see you today. 2)  increase celexa dose as discussed - your prescriptions have been electronically submitted to [facility]. Please take as directed. Contact our office if you believe you're having problems with the medication(s).  3)  Please schedule a follow-up appointment in 3-4 months to review the symptoms and medications, call sooner if problems.  Prescriptions: CITALOPRAM HYDROBROMIDE 40 MG TABS (CITALOPRAM HYDROBROMIDE) 1 by mouth once daily  #90 x 3   Entered and Authorized by:   Newt Lukes MD   Signed by:   Newt Lukes MD on 12/25/2010   Method used:   Faxed to ...       [facility] (mail-order)             , [place]         Ph: [phone]       Fax: [phone]   RxID:   [id number]    Orders Added: 1)  Est. Patient Level IV [72536] 2)  Prescription Created Electronically [id number]

## 2011-01-06 ENCOUNTER — Encounter: Payer: Self-pay | Admitting: Cardiovascular Disease

## 2011-01-08 NOTE — Assessment & Plan Note (Signed)
Summary: F1Y   Visit Type:  Follow-up Referring Provider:  N/A Primary Provider:  Dr Rene Paci  CC:  Followup peripheral vascular disease.  History of Present Illness: This is a 75 year old woman, seen for follow-up of foot pain and purple toes. She is also treated for hypertension and hyperlipidemia. The patient is doing well without cardiac symptoms at present. She remains active and exercises regularly. She denies chest pain cough dyspnea, edema, palpitations, orthopnea, or PND. She has had less trouble with her toes because of the mild winter and less cold exposure. She still describes some purplish discoloration at times but no pain.  Current Medications (verified): 1)  Simvastatin 40 Mg Tabs (Simvastatin) .... Take One Tablet By Mouth Daily At Bedtime 2)  Citalopram Hydrobromide 40 Mg Tabs (Citalopram Hydrobromide) .Marland Kitchen.. 1 By Mouth Once Daily 3)  Aspirin 81 Mg Tbec (Aspirin) .... Take 1 Tablet By Mouth Two Times A Day 4)  Align  Caps (Probiotic Product) .... Take 1 Tablet By Mouth Once A Day 5)  Vitamin D3 1000 Unit Tabs (Cholecalciferol) .... Take 1 Tablet By Mouth Once A Day 6)  Fish Oil 1000 Mg Caps (Omega-3 Fatty Acids) .... Take 1 Capsule By Mouth Two Times A Day 7)  Caltrate 600 1500 Mg Tabs (Calcium Carbonate) .... Two Times A Day 8)  Losartan Potassium 50 Mg Tabs (Losartan Potassium) .... Take One Tablet By Mouth Daily 9)  Claritin 10 Mg Tabs (Loratadine) .... As Needed 10)  Hemorrhoidal-Hc 25 Mg Supp (Hydrocortisone Acetate) .Marland Kitchen.. 1 Per Rectum Nightly As Needed  Allergies: 1)  ! Codeine  Past History:  Past Medical History: Last updated: 12/25/2010 hypertension  dyslipidemia Colonic polyps, hx of migraines postmenopausal syndrome IBS Erosive gastritis 2009   MD roster: cards - [facility] podiatry - t[facility] gyn - [facility] surg - [facility]  Vital Signs:  Patient profile:   75 year old female Height:      67 inches Weight:      192 pounds BMI:     30.18 Pulse  rate:   52 / minute BP sitting:   102 / 50  (left arm)  Vitals Entered By: Laurance Flatten CMA (December 29, 2010 9:37 AM)  Physical Exam  General:  Pt is alert and oriented, in no acute distress. HEENT: normal Neck: normal carotid upstrokes without bruits, JVP normal Lungs: CTA CV: RRR without murmur or gallop Abd: soft, NT, positive BS, no bruit, no organomegaly Ext: no edema. peripheral pulses 2+ and equal Skin: warm and dry without rash    EKG  Procedure date:  12/29/2010  Findings:      Sinus brady 52 bpm, cannot rule out age-indeterminate septal infarct.  Impression & Recommendations:  Problem # 1:  PVD (ICD-443.9) Pt is stable with respect to her mild acrocyanosis, cold-exposure related. No indication for further evaluation as she has no evidence of large vessel arterial disease and no signs of limb ischemia.  Problem # 2:  ESSENTIAL HYPERTENSION, BENIGN (ICD-401.1) Very well-controlled.  Her updated medication list for this problem includes:    Aspirin 81 Mg Tbec (Aspirin) .Marland Kitchen... Take 1 tablet by mouth two times a day    Losartan Potassium 50 Mg Tabs (Losartan potassium) .Marland Kitchen... Take one tablet by mouth daily  BP today: 102/50 Prior BP: 112/62 (12/25/2010)  Labs Reviewed: K+: 4.6 (08/15/2010) Creat: : 0.9 (08/15/2010)   Chol: 179 (03/26/2010)   HDL: 77.80 (03/26/2010)   LDL: 89 (03/26/2010)   TG: 63.0 (03/26/2010)  Problem # 3:  HYPERLIPIDEMIA (ICD-272.4) Lipids at goal on current Rx.  Her updated medication list for this problem includes:    Simvastatin 40 Mg Tabs (Simvastatin) .Marland Kitchen... Take one tablet by mouth daily at bedtime  Orders: EKG w/ Interpretation (93000)  CHOL: 179 (03/26/2010)   LDL: 89 (03/26/2010)   HDL: 77.80 (03/26/2010)   TG: 63.0 (03/26/2010) CRP: 0.30 mg/L (11/12/2009)     Patient Instructions: 1)  Your physician recommends that you continue on your current medications as directed. Please refer to the Current Medication list given to you  today. 2)  Your physician wants you to follow-up in:  1 YEAR.  You will receive a reminder letter in the mail two months in advance. If you don't receive a letter, please call our office to schedule the follow-up appointment.

## 2011-01-09 ENCOUNTER — Other Ambulatory Visit: Payer: Self-pay

## 2011-01-09 MED ORDER — SIMVASTATIN 40 MG PO TABS: 40.0000 mg | ORAL_TABLET | Freq: Every day | ORAL | Status: DC

## 2011-03-16 ENCOUNTER — Other Ambulatory Visit: Payer: Self-pay

## 2011-03-16 MED ORDER — LOSARTAN POTASSIUM 50 MG PO TABS: 50.0000 mg | ORAL_TABLET | Freq: Every day | ORAL | Status: DC

## 2011-04-03 ENCOUNTER — Other Ambulatory Visit: Payer: Self-pay

## 2011-04-03 MED ORDER — SIMVASTATIN 40 MG PO TABS: 40.0000 mg | ORAL_TABLET | Freq: Every day | ORAL | Status: DC

## 2011-05-15 ENCOUNTER — Encounter: Payer: Self-pay | Admitting: Internal Medicine

## 2011-05-20 ENCOUNTER — Other Ambulatory Visit (INDEPENDENT_AMBULATORY_CARE_PROVIDER_SITE_OTHER): Payer: Medicare Other

## 2011-05-20 ENCOUNTER — Other Ambulatory Visit: Payer: Self-pay | Admitting: Internal Medicine

## 2011-05-20 ENCOUNTER — Encounter: Payer: Self-pay | Admitting: Internal Medicine

## 2011-05-20 ENCOUNTER — Ambulatory Visit (INDEPENDENT_AMBULATORY_CARE_PROVIDER_SITE_OTHER): Payer: Medicare Other | Admitting: Internal Medicine

## 2011-05-20 DIAGNOSIS — R634 Abnormal weight loss: Secondary | ICD-10-CM

## 2011-05-20 DIAGNOSIS — R748 Abnormal levels of other serum enzymes: Secondary | ICD-10-CM | POA: Insufficient documentation

## 2011-05-20 DIAGNOSIS — E785 Hyperlipidemia, unspecified: Secondary | ICD-10-CM

## 2011-05-20 DIAGNOSIS — R5381 Other malaise: Secondary | ICD-10-CM

## 2011-05-20 DIAGNOSIS — R5383 Other fatigue: Secondary | ICD-10-CM

## 2011-05-20 DIAGNOSIS — I10 Essential (primary) hypertension: Secondary | ICD-10-CM

## 2011-05-20 LAB — BASIC METABOLIC PANEL
BUN: 21 mg/dL (ref 6–23)
CO2: 30 mEq/L (ref 19–32)
Chloride: 104 mEq/L (ref 96–112)
Glucose, Bld: 87 mg/dL (ref 70–99)
Potassium: 4.8 mEq/L (ref 3.5–5.1)
Sodium: 142 mEq/L (ref 135–145)

## 2011-05-20 LAB — CBC WITH DIFFERENTIAL/PLATELET
Basophils Absolute: 0 10*3/uL (ref 0.0–0.1)
HCT: 38 % (ref 36.0–46.0)
Hemoglobin: 12.7 g/dL (ref 12.0–15.0)
Lymphs Abs: 1 10*3/uL (ref 0.7–4.0)
MCV: 96.1 fl (ref 78.0–100.0)
Monocytes Absolute: 0.4 10*3/uL (ref 0.1–1.0)
Neutro Abs: 3.5 10*3/uL (ref 1.4–7.7)
Platelets: 183 10*3/uL (ref 150.0–400.0)
RDW: 13 % (ref 11.5–14.6)

## 2011-05-20 LAB — HEPATIC FUNCTION PANEL
Bilirubin, Direct: 0.1 mg/dL (ref 0.0–0.3)
Total Bilirubin: 0.8 mg/dL (ref 0.3–1.2)

## 2011-05-20 LAB — TSH: TSH: 1.7 u[IU]/mL (ref 0.35–5.50)

## 2011-05-20 NOTE — Assessment & Plan Note (Signed)
On ARB - The current medical regimen is effective;  continue present plan and medications.  BP Readings from Last 3 Encounters:  05/20/11 108/70  12/29/10 102/50  12/25/10 112/62

## 2011-05-20 NOTE — Assessment & Plan Note (Signed)
On statin - follows with cards as well annually The current medical regimen is effective;  continue present plan and medications.

## 2011-05-20 NOTE — Patient Instructions (Signed)
It was good to see you today. We have reviewed your prior records including labs and tests today Test(s) ordered today. Your results will be called to you after review (48-72hours after test completion). If any changes need to be made, you will be notified at that time. Medications reviewed, no changes at this time. Refill on medication(s) as discussed today. Please schedule followup in 6 months, call sooner if problems. Contact Dr. Marvell Fuller office as discussed - let us know if you have any problems

## 2011-05-20 NOTE — Progress Notes (Signed)
  Subjective:    Patient ID: Michele Burns, female    DOB: January 13, 1936, 75 y.o.   MRN: [id number]  HPI Here for follow up - reviewed chronic medical issues:  HTN - reports compliance with ongoing medical treatment and no changes in medication dose or frequency. denies adverse side effects related to current therapy.  No chest pain or headache or edema  dyslipidemia - on statin - reports compliance with ongoing medical treatment and no changes in medication dose or frequency. denies adverse side effects related to current therapy. follows with cards for same   postmenopausal syndrome- improved hot flashes, successful "wean off" premarin complete 07/2010 - denies vaginal dryness or pain - less sweating - worse with weather change and emotional stress    Depression - increase SSRI 07/2010 to help with PMS symptoms - fatigue but no irritability or tearfulness  Past Medical History  Diagnosis Date  . Dyslipidemia   . Hx of colonic polyp 2006    diminutive '06, no polyps on 4/09 colo - for repeat 4/14  . IBS (irritable bowel syndrome)   . Erosive gastritis 2006    NSAID precipitated, resolved on f/u EGD  . MIGRAINE HEADACHE   . PULMONARY NODULE   . CONSTIPATION   . PVD   . POSTMENOPAUSAL SYNDROME   . RENAL CYST   . Hypertension   . Hyperlipidemia   . DEPRESSION   . ACUT MI SUBENDOCARDIAL INFARCT SUBSQT EPIS CARE    Review of Systems  Constitutional: Positive for fatigue. Negative for fever.       Intentional weight loss with change in diet and increased cardio  Respiratory: Negative for cough.   Neurological: Negative for headaches.       Objective:   Physical Exam BP 108/70  Pulse 54  Temp(Src) 97.8 F (36.6 C) (Oral)  Ht 5\' 7"  (1.702 m)  Wt 140 lb (63.504 kg)  BMI 21.93 kg/m2  SpO2 98%  Constitutional: She is oriented to person, place, and time. She appears well-developed and well-nourished. No distress.  Eyes: Conjunctivae and EOM are normal. Pupils are equal,  round, and reactive to light. No scleral icterus.  Cardiovascular: Normal rate, regular rhythm and normal heart sounds.  No murmur heard. No BLE edema. Pulmonary/Chest: Effort normal and breath sounds normal. No respiratory distress. She has no wheezes. Psychiatric: She has a normal mood and affect. Her behavior is normal. Judgment and thought content normal.   Lab Results  Component Value Date   WBC 7.4 11/12/2009   HGB 12.7 11/12/2009   HCT 38.1 11/12/2009   PLT 203.0 11/12/2009   CHOL 179 03/26/2010   TRIG 63.0 03/26/2010   HDL 77.80 03/26/2010   ALT 27 03/26/2010   AST 32 03/26/2010   NA 139 08/15/2010   K 4.6 08/15/2010   CL 101 08/15/2010   CREATININE 0.9 08/15/2010   BUN 22 08/15/2010   CO2 30 08/15/2010   TSH 1.656 08/15/2008   Wt Readings from Last 3 Encounters:  05/20/11 140 lb (63.504 kg)  12/29/10 192 lb (87.091 kg)  12/25/10 147 lb 6.4 oz (66.86 kg)       Assessment & Plan:  See problem list. Medications and labs reviewed today.  Fatigue, poor sleep also associated with weight loss (?intentional) - check labs - cont SSRI for depression

## 2011-08-17 ENCOUNTER — Telehealth: Payer: Self-pay | Admitting: *Deleted

## 2011-08-17 DIAGNOSIS — R7401 Elevation of levels of liver transaminase levels: Secondary | ICD-10-CM

## 2011-08-17 NOTE — Telephone Encounter (Signed)
Need order in EPIC for hepatic...08/17/11@2 :38pm/LMB

## 2011-08-18 ENCOUNTER — Other Ambulatory Visit: Payer: Self-pay | Admitting: Internal Medicine

## 2011-08-18 DIAGNOSIS — R7401 Elevation of levels of liver transaminase levels: Secondary | ICD-10-CM

## 2011-08-20 ENCOUNTER — Other Ambulatory Visit: Payer: Medicare Other

## 2011-08-25 ENCOUNTER — Telehealth: Payer: Self-pay

## 2011-08-25 DIAGNOSIS — E785 Hyperlipidemia, unspecified: Secondary | ICD-10-CM

## 2011-08-25 NOTE — Telephone Encounter (Signed)
Pt advised via VM 

## 2011-08-25 NOTE — Telephone Encounter (Signed)
Yes okay to add under listed diagnosis code. Thanks

## 2011-08-25 NOTE — Telephone Encounter (Signed)
Pt called requesting to have lipids added to upcoming hepatic function panel. Okay to add Dx 272.4?

## 2011-08-27 ENCOUNTER — Other Ambulatory Visit (INDEPENDENT_AMBULATORY_CARE_PROVIDER_SITE_OTHER): Payer: Medicare Other

## 2011-08-27 DIAGNOSIS — E785 Hyperlipidemia, unspecified: Secondary | ICD-10-CM

## 2011-08-27 DIAGNOSIS — R7401 Elevation of levels of liver transaminase levels: Secondary | ICD-10-CM

## 2011-08-27 LAB — HEPATIC FUNCTION PANEL
Alkaline Phosphatase: 65 U/L (ref 39–117)
Bilirubin, Direct: 0.1 mg/dL (ref 0.0–0.3)

## 2011-08-27 LAB — LIPID PANEL
Cholesterol: 155 mg/dL (ref 0–200)
LDL Cholesterol: 65 mg/dL (ref 0–99)
VLDL: 9.6 mg/dL (ref 0.0–40.0)

## 2011-09-30 ENCOUNTER — Ambulatory Visit (INDEPENDENT_AMBULATORY_CARE_PROVIDER_SITE_OTHER): Payer: Medicare Other | Admitting: *Deleted

## 2011-09-30 DIAGNOSIS — Z23 Encounter for immunization: Secondary | ICD-10-CM

## 2011-10-27 ENCOUNTER — Encounter: Payer: Self-pay | Admitting: Internal Medicine

## 2011-10-30 ENCOUNTER — Encounter: Payer: Self-pay | Admitting: Internal Medicine

## 2011-12-08 ENCOUNTER — Encounter: Payer: Self-pay | Admitting: Internal Medicine

## 2011-12-08 ENCOUNTER — Ambulatory Visit (INDEPENDENT_AMBULATORY_CARE_PROVIDER_SITE_OTHER): Payer: Medicare Other | Admitting: Internal Medicine

## 2011-12-08 VITALS — BP 120/82 | HR 54 | Temp 97.9°F | Ht 67.0 in | Wt 138.4 lb

## 2011-12-08 DIAGNOSIS — F329 Major depressive disorder, single episode, unspecified: Secondary | ICD-10-CM

## 2011-12-08 DIAGNOSIS — I10 Essential (primary) hypertension: Secondary | ICD-10-CM

## 2011-12-08 DIAGNOSIS — F3289 Other specified depressive episodes: Secondary | ICD-10-CM

## 2011-12-08 DIAGNOSIS — E785 Hyperlipidemia, unspecified: Secondary | ICD-10-CM

## 2011-12-08 DIAGNOSIS — M79675 Pain in left toe(s): Secondary | ICD-10-CM

## 2011-12-08 DIAGNOSIS — M79609 Pain in unspecified limb: Secondary | ICD-10-CM

## 2011-12-08 NOTE — Progress Notes (Signed)
Subjective:    Patient ID: Michele Burns, female    DOB: 01-May-1936, 76 y.o.   MRN: [id number]  HPI  Here for follow up - reviewed chronic medical issues:  HTN - reports compliance with ongoing medical treatment and no changes in medication dose or frequency. denies adverse side effects related to current therapy.  No chest pain or headache or edema  dyslipidemia - on statin - reports compliance with ongoing medical treatment and no changes in medication dose or frequency. denies adverse side effects related to current therapy. follows with cards for same   postmenopausal syndrome- improved hot flashes, successful "wean off" premarin complete 07/2010 - denies vaginal dryness or pain - less sweating - worse with weather change and emotional stress    Depression - increase SSRI 07/2010 to help with PMS symptoms - fatigue but no irritability or tearfulness - no SI/HI, the patient reports compliance with medication(s) as prescribed. Denies adverse side effects.   Past Medical History  Diagnosis Date  . Dyslipidemia   . Hx of colonic polyp 2006    diminutive '06, no polyps on 4/09 colo - for repeat 4/14  . IBS (irritable bowel syndrome)   . Erosive gastritis 2006    NSAID precipitated, resolved on f/u EGD  . MIGRAINE HEADACHE   . PULMONARY NODULE   . PVD     normal ABI/TBI 09/2009, chronic L toe pain and vasospasm (Raynauds)  . POSTMENOPAUSAL SYNDROME   . RENAL CYST   . Hypertension   . Hyperlipidemia   . DEPRESSION    Review of Systems  Constitutional: Positive for fatigue. Negative for fever.       Intentional weight loss summer 2012 with change in diet and increased cardio  Respiratory: Negative for cough and shortness of breath.   Musculoskeletal: Negative for back pain and gait problem.  Neurological: Negative for headaches.       Objective:   Physical Exam  BP 120/82  Pulse 54  Temp(Src) 97.9 F (36.6 C) (Oral)  Ht 5\' 7"  (1.702 m)  Wt 138 lb 6.4 oz (62.778 kg)   BMI 21.68 kg/m2  SpO2 97%  Wt Readings from Last 3 Encounters:  12/08/11 138 lb 6.4 oz (62.778 kg)  05/20/11 140 lb (63.504 kg)  12/29/10 192 lb (87.091 kg)   Constitutional: She appears well-developed and well-nourished. No distress.  Eyes: Conjunctivae and EOM are normal. Pupils are equal, round, and reactive to light. No scleral icterus.  Cardiovascular: Normal rate, regular rhythm and normal heart sounds.  No murmur heard. No BLE edema. Pulmonary/Chest: Effort normal and breath sounds normal. No respiratory distress. She has no wheezes. MSkel - raynaud's changes all L toes - 4th toe cool but with deformity or edema, brown discoloration at tip of medial toepad . Psychiatric: She has a normal mood and affect. Her behavior is normal. Judgment and thought content normal.   Lab Results  Component Value Date   WBC 5.1 05/20/2011   HGB 12.7 05/20/2011   HCT 38.0 05/20/2011   PLT 183.0 05/20/2011   CHOL 155 08/27/2011   TRIG 48.0 08/27/2011   HDL 80.90 08/27/2011   ALT 45* 08/27/2011   AST 47* 08/27/2011   NA 142 05/20/2011   K 4.8 05/20/2011   CL 104 05/20/2011   CREATININE 0.8 05/20/2011   BUN 21 05/20/2011   CO2 30 05/20/2011   TSH 1.70 05/20/2011       Assessment & Plan:  See problem list. Medications and labs reviewed  today.  L 4th toe problems, chronic > 3 years - hx Morton's neuroma surgery for same in 1970s in [place] - has been eval by cards - not vascular problems with normal ABI/TBI 09/2009- ongoing care at podiatry for recurrent neuroma and vasospasm- exhaustion of conservative care without resolution of pain - will refer to ortho at pt request for another opinion on cause of pain and mgmt -

## 2011-12-08 NOTE — Assessment & Plan Note (Signed)
On celexa, symptoms stable The current medical regimen is effective;  continue present plan and medications.

## 2011-12-08 NOTE — Assessment & Plan Note (Signed)
On ARB - The current medical regimen is effective;  continue present plan and medications.  BP Readings from Last 3 Encounters:  12/08/11 120/82  05/20/11 108/70  12/29/10 102/50

## 2011-12-08 NOTE — Patient Instructions (Signed)
It was good to see you today. We have reviewed your prior records including labs and tests today Medications reviewed, no changes at this time. we'll make referral to foot specialist at [facility]. Our office will contact you regarding appointment(s) once made. Please schedule followup in 6 months, call sooner if problems.

## 2011-12-08 NOTE — Assessment & Plan Note (Signed)
On statin - follows with cards as well annually The current medical regimen is effective;  continue present plan and medications.  

## 2012-01-13 ENCOUNTER — Ambulatory Visit: Payer: Medicare Other | Admitting: Cardiovascular Disease

## 2012-01-19 ENCOUNTER — Other Ambulatory Visit: Payer: Self-pay | Admitting: *Deleted

## 2012-01-19 MED ORDER — SIMVASTATIN 40 MG PO TABS: 40.0000 mg | ORAL_TABLET | Freq: Every day | ORAL | Status: DC

## 2012-01-19 MED ORDER — CITALOPRAM HYDROBROMIDE 20 MG PO TABS: 20.0000 mg | ORAL_TABLET | Freq: Every day | ORAL | Status: DC

## 2012-03-01 ENCOUNTER — Encounter: Payer: Self-pay | Admitting: Cardiovascular Disease

## 2012-03-01 ENCOUNTER — Ambulatory Visit (INDEPENDENT_AMBULATORY_CARE_PROVIDER_SITE_OTHER): Payer: Medicare Other | Admitting: Cardiovascular Disease

## 2012-03-01 VITALS — BP 130/68 | HR 51 | Ht 66.0 in | Wt 135.8 lb

## 2012-03-01 DIAGNOSIS — E785 Hyperlipidemia, unspecified: Secondary | ICD-10-CM

## 2012-03-01 DIAGNOSIS — I10 Essential (primary) hypertension: Secondary | ICD-10-CM

## 2012-03-01 NOTE — Assessment & Plan Note (Signed)
Lipids are at goal on simvastatin. Continue the same. Last HDL was greater than 80 and last LDL is less than 70.

## 2012-03-01 NOTE — Progress Notes (Signed)
   HPI:  76 year old woman presenting for followup evaluation. She is followed for hypertension hyperlipidemia. She was initially seen for foot pain and acrocyanosis of the toes. This is improved with conservative measures. Her arterial studies were normal. Her hypertension and hyperlipidemia have been well-controlled and she is followed regularly by Dr. Felicity Coyer.  The patient remains physically active. She exercises regularly. She works full-time at [employer]. She denies chest pain or dyspnea.  Outpatient Encounter Prescriptions as of 03/01/2012  Medication Sig Dispense Refill  . aspirin 81 MG tablet Take 162 mg by mouth daily.       . Calcium Carbonate (CALTRATE 600) 1500 MG TABS Take 1 tablet by mouth 2 (two) times daily.        . Cholecalciferol (VITAMIN D) 1000 UNITS capsule Take 1,000 Units by mouth daily.        . citalopram (CELEXA) 40 MG tablet Take 40 mg by mouth daily.      Marland Kitchen co-enzyme Q-10 50 MG capsule Take 50 mg by mouth daily.      Marland Kitchen losartan (COZAAR) 50 MG tablet Take 1 tablet (50 mg total) by mouth daily.  90 tablet  3  . Omega-3 Fatty Acids (FISH OIL) 1000 MG CPDR Take 1 capsule by mouth 2 (two) times daily.        . Probiotic Product (PROBIOTIC ACIDOPHILUS) CAPS Take 1 tablet by mouth daily.        . simvastatin (ZOCOR) 40 MG tablet Take 1 tablet (40 mg total) by mouth at bedtime.  90 tablet  2  . zinc gluconate 50 MG tablet Take 50 mg by mouth daily.        Marland Kitchen DISCONTD: citalopram (CELEXA) 20 MG tablet Take 1 tablet (20 mg total) by mouth daily.  90 tablet  2    Allergies  Allergen Reactions  . Codeine     REACTION: severe nausea    Past Medical History  Diagnosis Date  . Dyslipidemia   . Hx of colonic polyp 2006    diminutive '06, no polyps on 4/09 colo - for repeat 4/14  . IBS (irritable bowel syndrome)   . Erosive gastritis 2006    NSAID precipitated, resolved on f/u EGD  . MIGRAINE HEADACHE   . PULMONARY NODULE   . PVD     normal ABI/TBI 09/2009, chronic L toe  pain and vasospasm (Raynauds)  . POSTMENOPAUSAL SYNDROME   . RENAL CYST   . Hypertension   . Hyperlipidemia   . DEPRESSION     ROS: Negative except as per HPI  BP 130/68  Pulse 51  Ht 5\' 6"  (1.676 m)  Wt 61.598 kg (135 lb 12.8 oz)  BMI 21.92 kg/m2  PHYSICAL EXAM: Pt is alert and oriented, NAD HEENT: normal Neck: JVP - normal, carotids 2+= without bruits Lungs: CTA bilaterally CV: Bradycardic and regular without murmur or gallop Abd: soft, NT, Positive BS, no hepatomegaly Ext: no C/C/E, distal pulses intact and equal Skin: warm/dry no rash  EKG:  Sinus bradycardia 51 beats per minute, age-indeterminate septal infarct here  ASSESSMENT AND PLAN:

## 2012-03-01 NOTE — Assessment & Plan Note (Signed)
Well-controlled on current medication with losartan. We'll followup in 12 months.

## 2012-03-01 NOTE — Patient Instructions (Signed)
Your physician wants you to follow-up in: 1 YEAR.  You will receive a reminder letter in the mail two months in advance. If you don't receive a letter, please call our office to schedule the follow-up appointment.  Your physician recommends that you continue on your current medications as directed. Please refer to the Current Medication list given to you today.  

## 2012-04-20 ENCOUNTER — Other Ambulatory Visit: Payer: Self-pay

## 2012-04-20 MED ORDER — LOSARTAN POTASSIUM 50 MG PO TABS: 50.0000 mg | ORAL_TABLET | Freq: Every day | ORAL | Status: DC

## 2012-05-24 ENCOUNTER — Other Ambulatory Visit (INDEPENDENT_AMBULATORY_CARE_PROVIDER_SITE_OTHER): Payer: Medicare Other

## 2012-05-24 ENCOUNTER — Ambulatory Visit (INDEPENDENT_AMBULATORY_CARE_PROVIDER_SITE_OTHER): Payer: Medicare Other | Admitting: Internal Medicine

## 2012-05-24 ENCOUNTER — Ambulatory Visit (INDEPENDENT_AMBULATORY_CARE_PROVIDER_SITE_OTHER)
Admission: RE | Admit: 2012-05-24 | Discharge: 2012-05-24 | Disposition: A | Discharge status: Home / Self Care | Payer: Medicare Other | Source: Ambulatory Visit

## 2012-05-24 ENCOUNTER — Encounter: Payer: Self-pay | Admitting: Internal Medicine

## 2012-05-24 VITALS — BP 120/70 | HR 53 | Temp 97.8°F | Ht 67.0 in | Wt 135.1 lb

## 2012-05-24 DIAGNOSIS — E785 Hyperlipidemia, unspecified: Secondary | ICD-10-CM

## 2012-05-24 DIAGNOSIS — R5381 Other malaise: Secondary | ICD-10-CM

## 2012-05-24 DIAGNOSIS — R5383 Other fatigue: Secondary | ICD-10-CM

## 2012-05-24 DIAGNOSIS — N959 Unspecified menopausal and perimenopausal disorder: Secondary | ICD-10-CM

## 2012-05-24 DIAGNOSIS — Z0000 Encounter for general adult medical examination without abnormal findings: Secondary | ICD-10-CM

## 2012-05-24 DIAGNOSIS — I10 Essential (primary) hypertension: Secondary | ICD-10-CM

## 2012-05-24 LAB — CBC WITH DIFFERENTIAL/PLATELET
Basophils Absolute: 0 10*3/uL (ref 0.0–0.1)
Basophils Relative: 0.7 % (ref 0.0–3.0)
Eosinophils Absolute: 0.1 10*3/uL (ref 0.0–0.7)
Hemoglobin: 13.3 g/dL (ref 12.0–15.0)
Lymphocytes Relative: 24.4 % (ref 12.0–46.0)
MCHC: 33.4 g/dL (ref 30.0–36.0)
Monocytes Relative: 10.6 % (ref 3.0–12.0)
Neutrophils Relative %: 61.8 % (ref 43.0–77.0)
RBC: 4.17 Mil/uL (ref 3.87–5.11)

## 2012-05-24 LAB — BASIC METABOLIC PANEL
CO2: 26 mEq/L (ref 19–32)
Calcium: 9.6 mg/dL (ref 8.4–10.5)
Creatinine, Ser: 0.8 mg/dL (ref 0.4–1.2)
GFR: 71.4 mL/min (ref >60.00)
Sodium: 137 mEq/L (ref 135–145)

## 2012-05-24 LAB — LIPID PANEL
Cholesterol: 186 mg/dL (ref 0–200)
LDL Cholesterol: 79 mg/dL (ref 0–99)
Triglycerides: 45 mg/dL (ref 0.0–149.0)
VLDL: 9 mg/dL (ref 0.0–40.0)

## 2012-05-24 LAB — HEPATIC FUNCTION PANEL
ALT: 33 U/L (ref 0–35)
Total Protein: 7.2 g/dL (ref 6.0–8.3)

## 2012-05-24 MED ORDER — VENLAFAXINE HCL ER 37.5 MG PO CP24: 37.5000 mg | ORAL_CAPSULE | Freq: Every day | ORAL | Status: DC

## 2012-05-24 MED ORDER — CITALOPRAM HYDROBROMIDE 40 MG PO TABS: 20.0000 mg | ORAL_TABLET | Freq: Every day | ORAL | Status: DC

## 2012-05-24 NOTE — Assessment & Plan Note (Signed)
On ARB - The current medical regimen is effective;  continue present plan and medications.  BP Readings from Last 3 Encounters:  05/24/12 120/70  03/01/12 130/68  12/08/11 120/82

## 2012-05-24 NOTE — Patient Instructions (Addendum)
It was good to see you today. We have reviewed your interval history including labs and tests today Health Maintenance reviewed - all recommended immunizations and age-appropriate screenings are up-to-date.  Test(s) ordered today. Your results will be called to you after review (48-72hours after test completion). If any changes need to be made, you will be notified at that time. we'll make referral for bone density. Our office will contact you regarding appointment(s) once made.  Wean off celexa by taking 1/2 tab daily for 2 weeks then stop -  In 2 weeks, start low dose generic Effexor - Your prescription(s) have been submitted to your pharmacy. Please take as directed and contact our office if you believe you are having problem(s) with the medication(s). Other medications reviewed, no changes at this time.  Please schedule followup in 2-3 months to recheck mood/medication, call sooner if problems. Health Maintenance, Females A healthy lifestyle and preventative care can promote health and wellness.  Maintain regular health, dental, and eye exams.   Eat a healthy diet. Foods like vegetables, fruits, whole grains, low-fat dairy products, and lean protein foods contain the nutrients you need without too many calories. Decrease your intake of foods high in solid fats, added sugars, and salt. Get information about a proper diet from your caregiver, if necessary.   Regular physical exercise is one of the most important things you can do for your health. Most adults should get at least 150 minutes of moderate-intensity exercise (any activity that increases your heart rate and causes you to sweat) each week. In addition, most adults need muscle-strengthening exercises on 2 or more days a week.     Maintain a healthy weight. The body mass index (BMI) is a screening tool to identify possible weight problems. It provides an estimate of body fat based on height and weight. Your caregiver can help determine  your BMI, and can help you achieve or maintain a healthy weight. For adults 20 years and older:   A BMI below 18.5 is considered underweight.   A BMI of 18.5 to 24.9 is normal.   A BMI of 25 to 29.9 is considered overweight.   A BMI of 30 and above is considered obese.   Maintain normal blood lipids and cholesterol by exercising and minimizing your intake of saturated fat. Eat a balanced diet with plenty of fruits and vegetables. Blood tests for lipids and cholesterol should begin at age 2 and be repeated every 5 years. If your lipid or cholesterol levels are high, you are over 50, or you are a high risk for heart disease, you may need your cholesterol levels checked more frequently. Ongoing high lipid and cholesterol levels should be treated with medicines if diet and exercise are not effective.   If you smoke, find out from your caregiver how to quit. If you do not use tobacco, do not start.   If you are pregnant, do not drink alcohol. If you are breastfeeding, be very cautious about drinking alcohol. If you are not pregnant and choose to drink alcohol, do not exceed 1 drink per day. One drink is considered to be 12 ounces (355 mL) of beer, 5 ounces (148 mL) of wine, or 1.5 ounces (44 mL) of liquor.   Avoid use of street drugs. Do not share needles with anyone. Ask for help if you need support or instructions about stopping the use of drugs.   High blood pressure causes heart disease and increases the risk of stroke. Blood pressure should  be checked at least every 1 to 2 years. Ongoing high blood pressure should be treated with medicines, if weight loss and exercise are not effective.   If you are 19 to 74 years old, ask your caregiver if you should take aspirin to prevent strokes.   Diabetes screening involves taking a blood sample to check your fasting blood sugar level. This should be done once every 3 years, after age 75, if you are within normal weight and without risk factors for  diabetes. Testing should be considered at a younger age or be carried out more frequently if you are overweight and have at least 1 risk factor for diabetes.   Breast cancer screening is essential preventative care for women. You should practice "breast self-awareness." This means understanding the normal appearance and feel of your breasts and may include breast self-examination. Any changes detected, no matter how small, should be reported to a caregiver. Women in their 81s and 30s should have a clinical breast exam (CBE) by a caregiver as part of a regular health exam every 1 to 3 years. After age 41, women should have a CBE every year. Starting at age 51, women should consider having a mammogram (breast X-ray) every year. Women who have a family history of breast cancer should talk to their caregiver about genetic screening. Women at a high risk of breast cancer should talk to their caregiver about having an MRI and a mammogram every year.   The Pap test is a screening test for cervical cancer. Women should have a Pap test starting at age 50. Between ages 68 and 58, Pap tests should be repeated every 2 years. Beginning at age 81, you should have a Pap test every 3 years as long as the past 3 Pap tests have been normal. If you had a hysterectomy for a problem that was not cancer or a condition that could lead to cancer, then you no longer need Pap tests. If you are between ages 80 and 46, and you have had normal Pap tests going back 10 years, you no longer need Pap tests. If you have had past treatment for cervical cancer or a condition that could lead to cancer, you need Pap tests and screening for cancer for at least 20 years after your treatment. If Pap tests have been discontinued, risk factors (such as a new sexual partner) need to be reassessed to determine if screening should be resumed. Some women have medical problems that increase the chance of getting cervical cancer. In these cases, your caregiver  may recommend more frequent screening and Pap tests.   The human papillomavirus (HPV) test is an additional test that may be used for cervical cancer screening. The HPV test looks for the virus that can cause the cell changes on the cervix. The cells collected during the Pap test can be tested for HPV. The HPV test could be used to screen women aged 75 years and older, and should be used in women of any age who have unclear Pap test results. After the age of 16, women should have HPV testing at the same frequency as a Pap test.   Colorectal cancer can be detected and often prevented. Most routine colorectal cancer screening begins at the age of 75 and continues through age 31. However, your caregiver may recommend screening at an earlier age if you have risk factors for colon cancer. On a yearly basis, your caregiver may provide home test kits to check for hidden blood  in the stool. Use of a small camera at the end of a tube, to directly examine the colon (sigmoidoscopy or colonoscopy), can detect the earliest forms of colorectal cancer. Talk to your caregiver about this at age 62, when routine screening begins. Direct examination of the colon should be repeated every 5 to 10 years through age 64, unless early forms of pre-cancerous polyps or small growths are found.   Hepatitis C blood testing is recommended for all people born from [birth year] through 1965 and any individual with known risks for hepatitis C.   Practice safe sex. Use condoms and avoid high-risk sexual practices to reduce the spread of sexually transmitted infections (STIs). Sexually active women aged 74 and younger should be checked for Chlamydia, which is a common sexually transmitted infection. Older women with new or multiple partners should also be tested for Chlamydia. Testing for other STIs is recommended if you are sexually active and at increased risk.   Osteoporosis is a disease in which the bones lose minerals and strength with aging.  This can result in serious bone fractures. The risk of osteoporosis can be identified using a bone density scan. Women ages 62 and over and women at risk for fractures or osteoporosis should discuss screening with their caregivers. Ask your caregiver whether you should be taking a calcium supplement or vitamin D to reduce the rate of osteoporosis.   Menopause can be associated with physical symptoms and risks. Hormone replacement therapy is available to decrease symptoms and risks. You should talk to your caregiver about whether hormone replacement therapy is right for you.   Use sunscreen with a sun protection factor (SPF) of 30 or greater. Apply sunscreen liberally and repeatedly throughout the day. You should seek shade when your shadow is shorter than you. Protect yourself by wearing long sleeves, pants, a wide-brimmed hat, and sunglasses year round, whenever you are outdoors.   Notify your caregiver of new moles or changes in moles, especially if there is a change in shape or color. Also notify your caregiver if a mole is larger than the size of a pencil eraser.   Stay current with your immunizations.  Document Released: 04/13/2011 Document Revised: 09/17/2011 Document Reviewed: 04/13/2011 [facility] Patient Information 2012 [place], [place].

## 2012-05-24 NOTE — Assessment & Plan Note (Signed)
On celexa sice 07/2010 for "PMS" irritability - feels not as well controlled now as before Wean off celexa and start SNRI - Effexor generic we reviewed potential risk/benefit and possible side effects - pt understands and agrees to same  follow up 2-3 mo to review symptoms and medications  Also schedule DEXA - last done > 2years ago

## 2012-05-24 NOTE — Assessment & Plan Note (Signed)
On statin - follows with cards as well annually The current medical regimen is effective;  continue present plan and medications.  

## 2012-05-24 NOTE — Progress Notes (Signed)
Subjective:    Patient ID: Michele Burns, female    DOB: [birth year], 76 y.o.   MRN: [id number]  HPI  Here for medicare wellness  Diet: heart healthy Physical activity: very active - aerobic, cardio workouts at gym Depression/mood screen: negative Hearing: intact to whispered voice Visual acuity: grossly normal, performs annual eye exam  ADLs: capable Fall risk: none Home safety: good Cognitive evaluation: intact to orientation, naming, recall and repetition EOL planning: adv directives, full code/ I agree  I have personally reviewed and have noted 1. The patient's medical and social history 2. Their use of alcohol, tobacco or illicit drugs 3. Their current medications and supplements 4. The patient's functional ability including ADL's, fall risks, home safety risks and hearing or visual impairment. 5. Diet and physical activities 6. Evidence for depression or mood disorders  Also reviewed chronic medical issues:  hypertension - reports compliance with ongoing medical treatment and no changes in medication dose or frequency. denies adverse side effects related to current therapy.  No chest pain or headache or edema  dyslipidemia - on statin - reports compliance with ongoing medical treatment and no changes in medication dose or frequency. denies adverse side effects related to current therapy. follows with cards for same   postmenopausal syndrome- improved hot flashes, successful "wean off" premarin complete 07/2010 - denies vaginal dryness or pain - less sweating - worse with weather change and emotional stress    Depression - increase SSRI 07/2010 to help with PMS symptoms - ongoing mild fatigue and increased irritability - no tearfulness - no SI/HI, the patient reports compliance with medication(s) as prescribed. Denies adverse side effects.   Past Medical History  Diagnosis Date  . Dyslipidemia   . Hx of colonic polyp 2006    diminutive '06, no polyps on 4/09 colo -  for repeat 4/14  . IBS (irritable bowel syndrome)   . Erosive gastritis 2006    NSAID precipitated, resolved on f/u EGD  . MIGRAINE HEADACHE   . PULMONARY NODULE   . PVD     normal ABI/TBI 09/2009, chronic L toe pain and vasospasm (Raynauds)  . POSTMENOPAUSAL SYNDROME   . RENAL CYST   . Hypertension   . Hyperlipidemia   . DEPRESSION    Family History  Problem Relation Age of Onset  . Heart attack Father 57    deceased  . Heart attack Mother 80    deceased  . Diabetes type I Mother   . Diabetes Mother   . Heart attack Brother 46    deceased  . Diabetes Paternal Grandmother    History  Substance Use Topics  . Smoking status: Former Smoker -- 30 years    Types: Cigarettes    Quit date: 10/12/1980  . Smokeless tobacco: Not on file   Comment: She is widowed since 26-she has 3 grown children and 5 g-kids. Moved to [place] from [place] in 2010 to be close to family  . Alcohol Use: 0.0 oz/week     1 per day    Review of Systems  Constitutional: Positive for fatigue. Negative for fever.       Intentional weight loss summer 2012 with change in diet and increased cardio  Respiratory: Negative for cough and shortness of breath.   Musculoskeletal: Negative for back pain and gait problem.  Neurological: Negative for headaches.  No other specific complaints in a complete review of systems (except as listed in HPI above).      Objective:  Physical Exam  BP 120/70  Pulse 53  Temp 97.8 F (36.6 C) (Oral)  Ht 5\' 7"  (1.702 m)  Wt 135 lb 1.9 oz (61.29 kg)  BMI 21.16 kg/m2  SpO2 97%  Wt Readings from Last 3 Encounters:  05/24/12 135 lb 1.9 oz (61.29 kg)  03/01/12 135 lb 12.8 oz (61.598 kg)  12/08/11 138 lb 6.4 oz (62.778 kg)   Constitutional: She appears well-developed and well-nourished. No distress.  Neck: supple, FROM, no thyroid enlargement or nodule - no JVD or LAD Eyes: Wears glasses. Conjunctivae and EOM are normal. Pupils are equal, round, and reactive to light.  No scleral icterus.  Cardiovascular: Normal rate, regular rhythm and normal heart sounds.  No murmur heard. No BLE edema. Pulmonary/Chest: Effort normal and breath sounds normal. No respiratory distress. She has no wheezes. MSkel - raynaud's changes all toes - no gross deformity Psychiatric: She has a normal mood and affect. Her behavior is normal. Judgment and thought content normal.   Lab Results  Component Value Date   WBC 5.1 05/20/2011   HGB 12.7 05/20/2011   HCT 38.0 05/20/2011   PLT 183.0 05/20/2011   CHOL 155 08/27/2011   TRIG 48.0 08/27/2011   HDL 80.90 08/27/2011   ALT 45* 08/27/2011   AST 47* 08/27/2011   NA 142 05/20/2011   K 4.8 05/20/2011   CL 104 05/20/2011   CREATININE 0.8 05/20/2011   BUN 21 05/20/2011   CO2 30 05/20/2011   TSH 1.70 05/20/2011       Assessment & Plan:  AWV/v70.0 - Today patient counseled on age appropriate routine health concerns for screening and prevention, each reviewed and up to date or declined. Immunizations reviewed and up to date or declined. Labs reviewed. Risk factors for depression reviewed and negative. Hearing function and visual acuity are intact. ADLs screened and addressed as needed. Functional ability and level of safety reviewed and appropriate. Education, counseling and referrals performed based on assessed risks today. Patient provided with a copy of personalized plan for preventive services.  Fatigue, mild - non specific exam - check labs and change SSRI (see PMP syndrome below) -  Also see problem list. Medications and labs reviewed today.

## 2012-07-25 ENCOUNTER — Other Ambulatory Visit: Payer: Self-pay

## 2012-07-25 MED ORDER — VENLAFAXINE HCL ER 37.5 MG PO CP24: 37.5000 mg | ORAL_CAPSULE | Freq: Every day | ORAL | Status: DC

## 2012-08-16 ENCOUNTER — Encounter: Payer: Self-pay | Admitting: Internal Medicine

## 2012-08-16 ENCOUNTER — Ambulatory Visit (INDEPENDENT_AMBULATORY_CARE_PROVIDER_SITE_OTHER): Payer: Medicare Other | Admitting: Internal Medicine

## 2012-08-16 VITALS — BP 110/74 | HR 58 | Temp 97.4°F | Ht 66.0 in | Wt 136.8 lb

## 2012-08-16 DIAGNOSIS — Z23 Encounter for immunization: Secondary | ICD-10-CM

## 2012-08-16 DIAGNOSIS — F3289 Other specified depressive episodes: Secondary | ICD-10-CM

## 2012-08-16 DIAGNOSIS — F329 Major depressive disorder, single episode, unspecified: Secondary | ICD-10-CM

## 2012-08-16 MED ORDER — VENLAFAXINE HCL ER 37.5 MG PO CP24: 37.5000 mg | ORAL_CAPSULE | Freq: Every day | ORAL | Status: DC

## 2012-08-16 NOTE — Progress Notes (Signed)
  Subjective:    Patient ID: Michele Burns, female    DOB: August 07, 1936, 76 y.o.   MRN: [id number]  HPI  Here for follow up - reviewed chronic medical issues:  hypertension - reports compliance with ongoing medical treatment and no changes in medication dose or frequency. denies adverse side effects related to current therapy.  No chest pain or headache or edema  dyslipidemia - on statin - reports compliance with ongoing medical treatment and no changes in medication dose or frequency. denies adverse side effects related to current therapy. follows with cards for same   postmenopausal syndrome- improved hot flashes, successful "wean off" premarin complete 07/2010 - denies vaginal dryness or pain - less sweating - worse with weather change and emotional stress    Depression - changed celexa to generic effexor 05/2012 - previously increase SSRI 07/2010 to help with PMS symptoms - symptoms manifest as mild fatigue and increased irritability which has improved - no tearfulness, trouble staying asleep - no SI/HI, the patient reports compliance with medication(s) as prescribed. Denies adverse side effects.   Past Medical History  Diagnosis Date  . Dyslipidemia   . Hx of colonic polyp 2006    diminutive '06, no polyps on 4/09 colo - for repeat 4/14  . IBS (irritable bowel syndrome)   . Erosive gastritis 2006    NSAID precipitated, resolved on f/u EGD  . MIGRAINE HEADACHE   . PULMONARY NODULE   . PVD     normal ABI/TBI 09/2009, chronic L toe pain and vasospasm (Raynauds)  . POSTMENOPAUSAL SYNDROME   . RENAL CYST   . Hypertension   . Hyperlipidemia   . DEPRESSION     Review of Systems  Constitutional: Positive for fatigue. Negative for fever.       Intentional weight loss summer 2012 with change in diet and increased cardio  Respiratory: Negative for cough and shortness of breath.   Musculoskeletal: Negative for back pain and gait problem.  Neurological: Negative for headaches.         Objective:   Physical Exam  BP 110/74  Pulse 58  Temp 97.4 F (36.3 C) (Oral)  Ht 5\' 6"  (1.676 m)  Wt 136 lb 12.8 oz (62.052 kg)  BMI 22.08 kg/m2  SpO2 99%  Wt Readings from Last 3 Encounters:  08/16/12 136 lb 12.8 oz (62.052 kg)  05/24/12 135 lb 1.9 oz (61.29 kg)  03/01/12 135 lb 12.8 oz (61.598 kg)   Constitutional: She appears well-developed and well-nourished. No distress. Cardiovascular: Normal rate, regular rhythm and normal heart sounds.  No murmur heard. No BLE edema. Pulmonary/Chest: Effort normal and breath sounds normal. No respiratory distress. She has no wheezes. MSkel - raynaud's changes all toes - no gross deformity Psychiatric: She has a normal mood and affect. Her behavior is normal. Judgment and thought content normal.   Lab Results  Component Value Date   WBC 5.1 05/24/2012   HGB 13.3 05/24/2012   HCT 39.9 05/24/2012   PLT 209.0 05/24/2012   CHOL 186 05/24/2012   TRIG 45.0 05/24/2012   HDL 98.50 05/24/2012   ALT 33 05/24/2012   AST 41* 05/24/2012   NA 137 05/24/2012   K 4.2 05/24/2012   CL 101 05/24/2012   CREATININE 0.8 05/24/2012   BUN 20 05/24/2012   CO2 26 05/24/2012   TSH 2.86 05/24/2012       Assessment & Plan:   see problem list. Medications and labs reviewed today.

## 2012-08-16 NOTE — Patient Instructions (Addendum)
It was good to see you today. We have reviewed your prior records including labs and tests today Medications reviewed, no changes at this time. Please schedule followup in 4-6 months, call sooner if problems.  

## 2012-08-16 NOTE — Assessment & Plan Note (Signed)
changed celexa to effexor 05/2012 - doing well Discussed OTC meds and sleep hygiene for insomnia symptoms - declines rx meds at this time The current medical regimen is effective;  continue present plan and medications.

## 2012-10-18 ENCOUNTER — Other Ambulatory Visit: Payer: Self-pay | Admitting: *Deleted

## 2012-10-18 MED ORDER — SIMVASTATIN 40 MG PO TABS: 40.0000 mg | ORAL_TABLET | Freq: Every day | ORAL | Status: DC

## 2012-11-01 ENCOUNTER — Encounter: Payer: Self-pay | Admitting: *Deleted

## 2012-11-01 ENCOUNTER — Ambulatory Visit (INDEPENDENT_AMBULATORY_CARE_PROVIDER_SITE_OTHER): Payer: Medicare Other | Admitting: Internal Medicine

## 2012-11-01 ENCOUNTER — Encounter: Payer: Self-pay | Admitting: Internal Medicine

## 2012-11-01 VITALS — BP 100/68 | HR 62 | Temp 98.0°F | Wt 133.0 lb

## 2012-11-01 DIAGNOSIS — I10 Essential (primary) hypertension: Secondary | ICD-10-CM

## 2012-11-01 DIAGNOSIS — R42 Dizziness and giddiness: Secondary | ICD-10-CM

## 2012-11-01 DIAGNOSIS — H8309 Labyrinthitis, unspecified ear: Secondary | ICD-10-CM

## 2012-11-01 DIAGNOSIS — F3289 Other specified depressive episodes: Secondary | ICD-10-CM

## 2012-11-01 DIAGNOSIS — F329 Major depressive disorder, single episode, unspecified: Secondary | ICD-10-CM

## 2012-11-01 MED ORDER — PROMETHAZINE HCL 25 MG PO TABS: 12.5000 mg | ORAL_TABLET | Freq: Four times a day (QID) | ORAL | Status: DC | PRN

## 2012-11-01 NOTE — Patient Instructions (Signed)
It was good to see you today. Promethazine as discussed for dizzy symptoms - Your prescription(s) have been submitted to your pharmacy. Please take as directed and contact our office if you believe you are having problem(s) with the medication(s). Work note for next 2 days as discussed Call if symptoms worse or unimproved in next 48h for other labs tests as discussed Please schedule appointment to discuss possible depression symptoms as discussed in next few weeks Labyrinthitis (Inner Ear Inflammation) Your exam shows you have an inner ear disturbance or labyrinthitis. The cause of this condition is not known. But it may be due to a virus infection. The symptoms of labyrinthitis include vertigo or dizziness made worse by motion, nausea and vomiting. The onset of labyrinthitis may be very sudden. It usually lasts for a few days and then clears up over 1-2 weeks. The treatment of an inner ear disturbance includes bed rest and medications to reduce dizziness, nausea, and vomiting. You should stay away from alcohol, tranquilizers, caffeine, nicotine, or any medicine your doctor thinks may make your symptoms worse. Further testing may be needed to evaluate your hearing and balance system. Please see your doctor or go to the emergency room right away if you have:  Increasing vertigo, earache, loss of hearing, or ear drainage.   Headache, blurred vision, trouble walking, fainting, or fever.   Persistent vomiting, dehydration, or extreme weakness.  Document Released: 09/28/2005 Document Revised: 12/21/2011 Document Reviewed: 03/16/2007 [facility] Patient Information 2013 [place], [place].

## 2012-11-01 NOTE — Assessment & Plan Note (Signed)
Increasing tearfulness symptoms associated with chronic insomnia Verified no SI/HI today May be exacerbated by acute illness with viral syndrome, please see above. Advice patient close followup in next several days to address in further detail and consider medication changes as needed. Change to Celexa to Effexor August 2013 -no other recent medication changes

## 2012-11-01 NOTE — Progress Notes (Signed)
Subjective:    Patient ID: Michele Burns, female    DOB: 11/22/1935, 77 y.o.   MRN: [id number]  HPI Complains of dizziness Onset 3 days ago Symptoms range from mild to moderate, intermittently present initially, now constant in past 24 hours. Associated with nausea and bilious vomiting; also severe exhaustion and myalgias. Denies headache, ear pain, fever, or head trauma Not associated with weakness, falls, difficulty speaking or change in hearing/vision Denies history of same No medication changes  Past Medical History  Diagnosis Date  . Dyslipidemia   . Hx of colonic polyp 2006    diminutive '06, no polyps on 4/09 colo - for repeat 4/14  . IBS (irritable bowel syndrome)   . Erosive gastritis 2006    NSAID precipitated, resolved on f/u EGD  . MIGRAINE HEADACHE   . PULMONARY NODULE   . PVD     normal ABI/TBI 09/2009, chronic L toe pain and vasospasm (Raynauds)  . POSTMENOPAUSAL SYNDROME   . RENAL CYST   . Hypertension   . Hyperlipidemia   . DEPRESSION     Review of Systems  Constitutional: Positive for fatigue. Negative for fever, diaphoresis and unexpected weight change.  HENT: Negative for facial swelling and neck pain.   Respiratory: Negative for cough and shortness of breath.   Cardiovascular: Negative for chest pain and palpitations.  Gastrointestinal: Positive for nausea. Negative for abdominal pain, diarrhea, constipation and abdominal distention.  Skin: Negative for color change and rash.  Neurological: Negative for tremors, seizures, syncope, facial asymmetry, speech difficulty, weakness, numbness and headaches.  Psychiatric/Behavioral: Negative for suicidal ideas, self-injury, dysphoric mood and decreased concentration. The patient is not nervous/anxious.        Objective:   Physical Exam BP 100/68  Pulse 62  Temp 98 F (36.7 C) (Oral)  Wt 133 lb (60.328 kg)  SpO2 95% Wt Readings from Last 3 Encounters:  11/01/12 133 lb (60.328 kg)  08/16/12 136 lb  12.8 oz (62.052 kg)  05/24/12 135 lb 1.9 oz (61.29 kg)   Constitutional: She appears well-developed and well-nourished. No distress.  HENT: Head: Normocephalic and atraumatic. Ears: B TMs ok, no erythema or effusion; Nose: Nose normal. Mouth/Throat: Oropharynx is clear and moist. No oropharyngeal exudate.  Eyes: Conjunctivae and EOM are normal. Pupils are equal, round, and reactive to light. No scleral icterus.  no nystagmus Neck: Normal range of motion. Neck supple. No JVD or LAD present. No thyromegaly present.  Cardiovascular: Normal rate, regular rhythm and normal heart sounds.  No murmur heard. No BLE edema. Pulmonary/Chest: Effort normal and breath sounds normal. No respiratory distress. She has no wheezes.  Neurological: She is alert and oriented to person, place, and time. No cranial nerve deficit. Coordination, speech, balance and gait are normal.  Psychiatric: She has a normal mood and affect. Became tearful at the end of exam, but her behavior is normal. Judgment and thought content normal.   Lab Results  Component Value Date   WBC 5.1 05/24/2012   HGB 13.3 05/24/2012   HCT 39.9 05/24/2012   PLT 209.0 05/24/2012   GLUCOSE 87 05/24/2012   CHOL 186 05/24/2012   TRIG 45.0 05/24/2012   HDL 98.50 05/24/2012   LDLCALC 79 05/24/2012   ALT 33 05/24/2012   AST 41* 05/24/2012   NA 137 05/24/2012   K 4.2 05/24/2012   CL 101 05/24/2012   CREATININE 0.8 05/24/2012   BUN 20 05/24/2012   CO2 26 05/24/2012   TSH 2.86 05/24/2012  Assessment & Plan:  Dizzy symptoms x 72h - Neuro exam and ENT, CV benign Suspect symptomatic vestibular labyrinthitis -likely viral syndrome  Will treat symptoms with promethazine 3 times a day to 4 times a day for next 48 hours, then when necessary Hydration and rest recommended, work excuse provided for next 48 hours Reassurance provided, patient to call if symptoms worse or unimproved consider further testing as needed   Tearfulness. Possible depression symptoms?  Patient reports increased tearfulness without cause. Denies suicidal or homicidal ideation. No prior history of known depression but would like to discuss same in further detail. I have recommended patient make a followup appointment in near future to discuss in depth as needed. Support offered, no medications recommended at this time

## 2012-11-01 NOTE — Assessment & Plan Note (Signed)
On ARB - The current medical regimen is effective;  continue present plan and medications.  BP Readings from Last 3 Encounters:  11/01/12 100/68  08/16/12 110/74  05/24/12 120/70

## 2012-11-09 ENCOUNTER — Encounter: Payer: Self-pay | Admitting: Physician Assistant

## 2012-11-09 ENCOUNTER — Ambulatory Visit (INDEPENDENT_AMBULATORY_CARE_PROVIDER_SITE_OTHER): Payer: Medicare Other | Admitting: Physician Assistant

## 2012-11-09 VITALS — BP 130/68 | HR 54 | Ht 66.0 in | Wt 138.0 lb

## 2012-11-09 DIAGNOSIS — E785 Hyperlipidemia, unspecified: Secondary | ICD-10-CM

## 2012-11-09 DIAGNOSIS — R079 Chest pain, unspecified: Secondary | ICD-10-CM

## 2012-11-09 DIAGNOSIS — I10 Essential (primary) hypertension: Secondary | ICD-10-CM

## 2012-11-09 MED ORDER — NITROGLYCERIN 0.4 MG SL SUBL: 0.4000 mg | SUBLINGUAL_TABLET | SUBLINGUAL | Status: DC | PRN

## 2012-11-09 NOTE — Progress Notes (Signed)
[address], [address] [place], [place]  [address] Phone: [phone], Fax:  [phone]  Date:  11/09/2012   ID:  SORINA DERRIG, DOB 07-12-1936, MRN [id number]  PCP:  Rene Paci, MD  Primary Cardiologist:  Dr. Tonny Bollman     History of Present Illness: Michele Burns is a 77 y.o. female who presents as an add on for chest pain.  She has a hx of HTN, HL and strong FHx of CAD.  Echo 12/10: EF 55-60%, Gr 1 diast dysfxn, mild MR, mild LAE.  ABIs 09/2009: normal.  Chest CT in 08/2010 demonstrated coronary calcification.  Saw PCP recently for vertiginous symptoms.  She has had 2 episodes of chest pain recently.  This is unusual for her.  She notes right sided chest pain that awoke her from sleep 6-8 weeks ago.  It lasted seconds to minutes.  No associated symptoms.  She felt fine afterward and went back to sleep.  This past weekend she had another episode at work.  She was not doing anything exertional.  She had some jaw pain.  It lasted maybe 2 mins.  No assoc dyspnea, nausea, diaphoresis.  No hx of syncope.  No recent orthopnea, PND, edema.  No hx of exertional chest pain or DOE.  No recent exercise intolerance.   Labs (8/13):  K 4.2, creatinine 0.8, ALT 33, LDL 79, Hgb 13.3, TSH 2.86  Wt Readings from Last 3 Encounters:  11/09/12 138 lb (62.596 kg)  11/01/12 133 lb (60.328 kg)  08/16/12 136 lb 12.8 oz (62.052 kg)     Past Medical History  Diagnosis Date  . Dyslipidemia   . Hx of colonic polyp 2006    diminutive '06, no polyps on 4/09 colo - for repeat 4/14  . IBS (irritable bowel syndrome)   . Erosive gastritis 2006    NSAID precipitated, resolved on f/u EGD  . MIGRAINE HEADACHE   . PULMONARY NODULE   . PVD     normal ABI/TBI 09/2009, chronic L toe pain and vasospasm (Raynauds)  . POSTMENOPAUSAL SYNDROME   . RENAL CYST   . Hypertension   . Hyperlipidemia   . DEPRESSION     Current Outpatient Prescriptions  Medication Sig Dispense Refill    . aspirin 81 MG tablet Take 162 mg by mouth daily.       . Calcium Carbonate (CALTRATE 600) 1500 MG TABS Take 1 tablet by mouth 2 (two) times daily.        . Cholecalciferol (VITAMIN D) 1000 UNITS capsule Take 1,000 Units by mouth daily.        Marland Kitchen co-enzyme Q-10 50 MG capsule Take 50 mg by mouth daily.      Marland Kitchen losartan (COZAAR) 50 MG tablet Take 1 tablet (50 mg total) by mouth daily.  90 tablet  3  . Magnesium 250 MG TABS Take by mouth 2 (two) times daily.      . Omega-3 Fatty Acids (FISH OIL) 1000 MG CPDR Take 1 capsule by mouth 2 (two) times daily.        . Probiotic Product (PROBIOTIC ACIDOPHILUS) CAPS Take 1 tablet by mouth daily.        . promethazine (PHENERGAN) 25 MG tablet Take 0.5-1 tablets (12.5-25 mg total) by mouth every 6 (six) hours as needed for nausea.  30 tablet  1  . simvastatin (ZOCOR) 40 MG tablet Take 1 tablet (40 mg total) by mouth at bedtime.  90 tablet  2  .  venlafaxine XR (EFFEXOR XR) 37.5 MG 24 hr capsule Take 1 capsule (37.5 mg total) by mouth daily.  90 capsule  3  . zinc gluconate 50 MG tablet Take 50 mg by mouth daily.          Allergies:    Allergies  Allergen Reactions  . Codeine     REACTION: severe nausea    Social History:  The patient  reports that she quit smoking about 32 years ago. Her smoking use included Cigarettes. She quit after 30 years of use. She does not have any smokeless tobacco history on file. She reports that she drinks alcohol. She reports that she does not use illicit drugs.   Family History:  The patient's family history includes Diabetes in her mother and paternal grandmother; Diabetes type I in her mother; Heart attack (age of onset:46) in her brother; Heart attack (age of onset:58) in her father; and Heart attack (age of onset:70) in her mother.   ROS:  Please see the history of present illness.   All other systems reviewed and negative.   PHYSICAL EXAM: VS:  BP 130/68  Pulse 54  Ht 5\' 6"  (1.676 m)  Wt 138 lb (62.596 kg)  BMI  22.27 kg/m2 Well nourished, well developed, in no acute distress HEENT: normal Neck: no JVD Cardiac:  normal S1, S2; RRR; no murmur Lungs:  clear to auscultation bilaterally, no wheezing, rhonchi or rales Abd: soft, nontender, no hepatomegaly Ext: no edema Skin: warm and dry Neuro:  CNs 2-12 intact, no focal abnormalities noted  EKG:  Sinus brady, HR 54, LAD, NSSTTW changes, septal Q waves, no change from prior tracing   ASSESSMENT AND PLAN:  1. Chest Pain:  She has atypical symptoms.  But she has several risk factors including coronary calcification on prior chest CT, strong FHx, HTN, HL, prior smoking hx.  I will arrange an ETT-Myoview.  Continue ASA, statin.  Will give Rx for NTG to use prn. 2. Hypertension:  Controlled.  Continue current therapy.  3. Hyperlipidemia:  Continue statin. 4. Disposition:  Follow up with Dr. Tonny Bollman in 1 month.  Luna Glasgow, PA-C  12:49 PM 11/09/2012

## 2012-11-09 NOTE — Patient Instructions (Addendum)
Your physician has requested that you have en exercise stress myoview. For further information please visit [email or web address] Please follow instruction sheet, as given.  A PRESCRIPTION FOR NITROGLYCERIN HAS BEEN SENT IN TODAY FOR YOU AND YOU HAVE BEEN ADVISED AS TO HOW AND WHEN TO USE NTG.  PLEASE FOLLOW WITH DR. Excell Seltzer IN 3-4 WEEKS PER SCOTT WEAVER, PAC

## 2012-11-14 ENCOUNTER — Ambulatory Visit: Payer: Medicare Other | Admitting: Physician Assistant

## 2012-11-17 ENCOUNTER — Encounter: Payer: Self-pay | Admitting: Internal Medicine

## 2012-11-17 ENCOUNTER — Observation Stay (HOSPITAL_COMMUNITY)
Admission: RE | Admit: 2012-11-17 | Discharge: 2012-11-18 | Disposition: A | Discharge status: Home / Self Care | Payer: Medicare Other | Source: Ambulatory Visit | Attending: Cardiology | Admitting: Cardiology

## 2012-11-17 ENCOUNTER — Ambulatory Visit (INDEPENDENT_AMBULATORY_CARE_PROVIDER_SITE_OTHER): Payer: Medicare Other | Admitting: Internal Medicine

## 2012-11-17 ENCOUNTER — Encounter (HOSPITAL_COMMUNITY)
Admission: RE | Disposition: A | Discharge status: Home / Self Care | Payer: Self-pay | Source: Ambulatory Visit | Attending: Cardiology

## 2012-11-17 ENCOUNTER — Ambulatory Visit (HOSPITAL_BASED_OUTPATIENT_CLINIC_OR_DEPARTMENT_OTHER): Payer: Medicare Other | Admitting: Radiology

## 2012-11-17 VITALS — BP 129/93 | Ht 66.0 in | Wt 131.0 lb

## 2012-11-17 VITALS — BP 129/93 | HR 48 | Ht 66.0 in | Wt 131.0 lb

## 2012-11-17 DIAGNOSIS — Z7982 Long term (current) use of aspirin: Secondary | ICD-10-CM | POA: Insufficient documentation

## 2012-11-17 DIAGNOSIS — I25810 Atherosclerosis of coronary artery bypass graft(s) without angina pectoris: Secondary | ICD-10-CM

## 2012-11-17 DIAGNOSIS — F329 Major depressive disorder, single episode, unspecified: Secondary | ICD-10-CM | POA: Insufficient documentation

## 2012-11-17 DIAGNOSIS — E7800 Pure hypercholesterolemia, unspecified: Secondary | ICD-10-CM | POA: Insufficient documentation

## 2012-11-17 DIAGNOSIS — I498 Other specified cardiac arrhythmias: Secondary | ICD-10-CM | POA: Insufficient documentation

## 2012-11-17 DIAGNOSIS — I2510 Atherosclerotic heart disease of native coronary artery without angina pectoris: Secondary | ICD-10-CM

## 2012-11-17 DIAGNOSIS — Z79899 Other long term (current) drug therapy: Secondary | ICD-10-CM | POA: Insufficient documentation

## 2012-11-17 DIAGNOSIS — Z7902 Long term (current) use of antithrombotics/antiplatelets: Secondary | ICD-10-CM | POA: Insufficient documentation

## 2012-11-17 DIAGNOSIS — R9439 Abnormal result of other cardiovascular function study: Secondary | ICD-10-CM

## 2012-11-17 DIAGNOSIS — I208 Other forms of angina pectoris: Secondary | ICD-10-CM

## 2012-11-17 DIAGNOSIS — E785 Hyperlipidemia, unspecified: Secondary | ICD-10-CM

## 2012-11-17 DIAGNOSIS — F3289 Other specified depressive episodes: Secondary | ICD-10-CM | POA: Insufficient documentation

## 2012-11-17 DIAGNOSIS — I10 Essential (primary) hypertension: Secondary | ICD-10-CM

## 2012-11-17 DIAGNOSIS — R079 Chest pain, unspecified: Secondary | ICD-10-CM

## 2012-11-17 DIAGNOSIS — I2089 Other forms of angina pectoris: Secondary | ICD-10-CM

## 2012-11-17 HISTORY — DX: Bradycardia, unspecified: R00.1

## 2012-11-17 HISTORY — PX: LEFT HEART CATHETERIZATION WITH CORONARY ANGIOGRAM: SHX5451

## 2012-11-17 HISTORY — DX: Atherosclerotic heart disease of native coronary artery without angina pectoris: I25.10

## 2012-11-17 LAB — CBC
Hemoglobin: 13.2 g/dL (ref 12.0–15.0)
MCH: 30.5 pg (ref 26.0–34.0)
MCH: 30.2 pg (ref 26.0–34.0)
MCHC: 34.4 g/dL (ref 30.0–36.0)
MCV: 87.3 fL (ref 78.0–100.0)
MCV: 87.9 fL (ref 78.0–100.0)
Platelets: 203 10*3/uL (ref 150–400)
RBC: 4.33 MIL/uL (ref 3.87–5.11)
RBC: 4.3 MIL/uL (ref 3.87–5.11)

## 2012-11-17 LAB — BASIC METABOLIC PANEL
BUN: 18 mg/dL (ref 6–23)
CO2: 27 mEq/L (ref 19–32)
Calcium: 9.6 mg/dL (ref 8.4–10.5)
Chloride: 104 mEq/L (ref 96–112)
Creatinine, Ser: 0.76 mg/dL (ref 0.50–1.10)

## 2012-11-17 SURGERY — LEFT HEART CATHETERIZATION WITH CORONARY ANGIOGRAM
Anesthesia: LOCAL

## 2012-11-17 MED ORDER — ASPIRIN 81 MG PO CHEW
81.0000 mg | CHEWABLE_TABLET | Freq: Every day | ORAL | Status: DC
  Filled 2012-11-17: qty 1

## 2012-11-17 MED ORDER — ONDANSETRON HCL 4 MG/2ML IJ SOLN: 4.0000 mg | Freq: Four times a day (QID) | INTRAMUSCULAR | Status: DC | PRN

## 2012-11-17 MED ORDER — SODIUM CHLORIDE 0.9 % IV SOLN: INTRAVENOUS | Status: DC

## 2012-11-17 MED ORDER — ENOXAPARIN SODIUM 30 MG/0.3ML ~~LOC~~ SOLN
40.0000 mg | SUBCUTANEOUS | Status: DC
  Filled 2012-11-17: qty 0.4

## 2012-11-17 MED ORDER — ASPIRIN 81 MG PO CHEW
324.0000 mg | CHEWABLE_TABLET | ORAL | Status: AC
  Administered 2012-11-17: 324 mg via ORAL
  Filled 2012-11-17: qty 4

## 2012-11-17 MED ORDER — PROMETHAZINE HCL 12.5 MG PO TABS
12.5000 mg | ORAL_TABLET | Freq: Four times a day (QID) | ORAL | Status: DC | PRN
Start: 2012-11-17 — End: 2012-11-18
  Filled 2012-11-17: qty 2

## 2012-11-17 MED ORDER — ZOLPIDEM TARTRATE 5 MG PO TABS: 5.0000 mg | ORAL_TABLET | Freq: Every evening | ORAL | Status: DC | PRN

## 2012-11-17 MED ORDER — BACID PO TABS
2.0000 | ORAL_TABLET | Freq: Every day | ORAL | Status: DC
  Filled 2012-11-17 (×2): qty 2

## 2012-11-17 MED ORDER — NITROGLYCERIN 1 MG/10 ML FOR IR/CATH LAB
INTRA_ARTERIAL | Status: AC
  Filled 2012-11-17: qty 10

## 2012-11-17 MED ORDER — SODIUM CHLORIDE 0.9 % IJ SOLN: 3.0000 mL | INTRAMUSCULAR | Status: DC | PRN

## 2012-11-17 MED ORDER — CALCIUM CARBONATE 1250 (500 CA) MG PO TABS
3.0000 | ORAL_TABLET | Freq: Two times a day (BID) | ORAL | Status: DC
  Administered 2012-11-17: 1500 mg via ORAL
  Filled 2012-11-17 (×4): qty 3

## 2012-11-17 MED ORDER — MIDAZOLAM HCL 2 MG/2ML IJ SOLN
INTRAMUSCULAR | Status: AC
  Filled 2012-11-17: qty 2

## 2012-11-17 MED ORDER — MAGNESIUM OXIDE 400 (241.3 MG) MG PO TABS
200.0000 mg | ORAL_TABLET | Freq: Two times a day (BID) | ORAL | Status: DC
  Administered 2012-11-17: 200 mg via ORAL
  Filled 2012-11-17 (×3): qty 0.5

## 2012-11-17 MED ORDER — OMEGA-3-ACID ETHYL ESTERS 1 G PO CAPS
1.0000 g | ORAL_CAPSULE | Freq: Two times a day (BID) | ORAL | Status: DC
  Administered 2012-11-17: 1 g via ORAL
  Filled 2012-11-17 (×3): qty 1

## 2012-11-17 MED ORDER — SODIUM CHLORIDE 0.9 % IJ SOLN: 3.0000 mL | Freq: Two times a day (BID) | INTRAMUSCULAR | Status: DC

## 2012-11-17 MED ORDER — TECHNETIUM TC 99M SESTAMIBI GENERIC - CARDIOLITE
33.0000 | Freq: Once | INTRAVENOUS | Status: AC | PRN
  Administered 2012-11-17: 33 via INTRAVENOUS

## 2012-11-17 MED ORDER — FENTANYL CITRATE 0.05 MG/ML IJ SOLN
INTRAMUSCULAR | Status: AC
  Filled 2012-11-17: qty 2

## 2012-11-17 MED ORDER — HEPARIN (PORCINE) IN NACL 2-0.9 UNIT/ML-% IJ SOLN
INTRAMUSCULAR | Status: AC
  Filled 2012-11-17: qty 1000

## 2012-11-17 MED ORDER — HEPARIN SODIUM (PORCINE) 1000 UNIT/ML IJ SOLN
INTRAMUSCULAR | Status: AC
  Filled 2012-11-17: qty 1

## 2012-11-17 MED ORDER — ACETAMINOPHEN 325 MG PO TABS
650.0000 mg | ORAL_TABLET | ORAL | Status: DC | PRN
  Administered 2012-11-17: 20:00:00 650 mg via ORAL
  Filled 2012-11-17: qty 2

## 2012-11-17 MED ORDER — TECHNETIUM TC 99M SESTAMIBI GENERIC - CARDIOLITE
11.0000 | Freq: Once | INTRAVENOUS | Status: AC | PRN
  Administered 2012-11-17: 11 via INTRAVENOUS

## 2012-11-17 MED ORDER — ASPIRIN 81 MG PO CHEW: 162.0000 mg | CHEWABLE_TABLET | Freq: Every day | ORAL | Status: DC

## 2012-11-17 MED ORDER — VENLAFAXINE HCL ER 37.5 MG PO CP24
37.5000 mg | ORAL_CAPSULE | Freq: Every day | ORAL | Status: DC
  Filled 2012-11-17 (×2): qty 1

## 2012-11-17 MED ORDER — SODIUM CHLORIDE 0.9 % IV SOLN: 250.0000 mL | INTRAVENOUS | Status: DC | PRN

## 2012-11-17 MED ORDER — PROBIOTIC ACIDOPHILUS BIOBEADS PO CAPS: 1.0000 | ORAL_CAPSULE | Freq: Every day | ORAL | Status: DC

## 2012-11-17 MED ORDER — VERAPAMIL HCL 2.5 MG/ML IV SOLN
INTRAVENOUS | Status: AC
  Filled 2012-11-17: qty 2

## 2012-11-17 MED ORDER — LIDOCAINE HCL (PF) 1 % IJ SOLN
INTRAMUSCULAR | Status: AC
  Filled 2012-11-17: qty 30

## 2012-11-17 MED ORDER — SIMVASTATIN 40 MG PO TABS
40.0000 mg | ORAL_TABLET | Freq: Every day | ORAL | Status: DC
  Filled 2012-11-17 (×2): qty 1

## 2012-11-17 MED ORDER — SODIUM CHLORIDE 0.9 % IV SOLN
INTRAVENOUS | Status: DC
  Administered 2012-11-17: 16:00:00 via INTRAVENOUS

## 2012-11-17 MED ORDER — VITAMIN D3 25 MCG (1000 UNIT) PO TABS
1000.0000 [IU] | ORAL_TABLET | Freq: Every day | ORAL | Status: DC
  Filled 2012-11-17 (×2): qty 1

## 2012-11-17 MED ORDER — SODIUM CHLORIDE 0.9 % IV SOLN
INTRAVENOUS | Status: AC
  Administered 2012-11-17: 19:00:00 via INTRAVENOUS

## 2012-11-17 MED ORDER — MAGNESIUM 250 MG PO TABS: 250.0000 mg | ORAL_TABLET | Freq: Two times a day (BID) | ORAL | Status: DC

## 2012-11-17 NOTE — Interval H&P Note (Signed)
History and Physical Interval Note:  11/17/2012 4:31 PM  Michele Burns  has presented today for surgery, with the diagnosis of Chest pain and abnormal stress test. The various methods of treatment have been discussed with the patient and family. After consideration of risks, benefits and other options for treatment, the patient has consented to  Procedure(s) (LRB) with comments: LEFT HEART CATHETERIZATION WITH CORONARY ANGIOGRAM (N/A) as a surgical intervention .  The patient's history has been reviewed, patient examined, no change in status, stable for surgery.  I have reviewed the patient's chart and labs.  Questions were answered to the patient's satisfaction.     Theron Arista [facility] 11/17/2012 4:32 PM

## 2012-11-17 NOTE — Progress Notes (Signed)
TR BAND REMOVAL  LOCATION:    right radial  DEFLATED PER PROTOCOL:    yes  TIME BAND OFF / DRESSING APPLIED:    23:15   SITE UPON ARRIVAL:    Level 0  SITE AFTER BAND REMOVAL:    Level 0  REVERSE ALLEN'S TEST:     positive  CIRCULATION SENSATION AND MOVEMENT:    Within Normal Limits   yes  COMMENTS:    

## 2012-11-17 NOTE — Progress Notes (Signed)
[facility] SITE 3 NUCLEAR MED [address] [place], [place] [address] [phone]    Cardiology Nuclear Med Study  Michele Burns is a 77 y.o. female     MRN : [id number]     DOB: 08-12-1936  Procedure Date: 11/17/2012  Nuclear Med Background Indication for Stress Test:  Evaluation for Ischemia History:  2010 EF 55-60% Cardiac Risk Factors: Family History - CAD, History of Smoking, Hypertension and Lipids  Symptoms:  Chest Pain   Nuclear Pre-Procedure Caffeine/Decaff Intake:  None NPO After: 7:00pm   Lungs:  clear O2 Sat: 97% on room air. IV 0.9% NS with Angio Cath:  22g  IV Site: R Antecubital  IV Started by:  Bonnita Levan, RN  Chest Size (in):  36 Cup Size: A  Height: 5\' 6"  (1.676 m)  Weight:  131 lb (59.421 kg)  BMI:  Body mass index is 21.14 kg/(m^2). Tech Comments:  N/A    Nuclear Med Study 1 or 2 day study: 1 day  Stress Test Type:  Stress  Reading MD: Cassell Clement, MD  Order Authorizing Provider:  Tonny Bollman, MD  Resting Radionuclide: Technetium 104m Sestamibi  Resting Radionuclide Dose: 11.0 mCi   Stress Radionuclide:  Technetium 87m Sestamibi  Stress Radionuclide Dose: 33.0 mCi           Stress Protocol Rest HR: 48 Stress HR: 134  Rest BP: 129/93 Stress BP: 172/80  Exercise Time (min): 10:41 METS: 12.8   Predicted Max HR: 146 bpm % Max HR: 91.78 bpm Rate Pressure Product: 21308    Dose of Adenosine (mg):  n/a Dose of Lexiscan: n/a mg  Dose of Atropine (mg): n/a Dose of Dobutamine: n/a mcg/kg/min (at max HR)  Stress Test Technologist: Bonnita Levan, RN  Nuclear Technologist:  Domenic Polite, CNMT     Rest Procedure:  Myocardial perfusion imaging was performed at rest 45 minutes following the intravenous administration of Technetium 14m Sestamibi. Rest ECG: NSR - Normal EKG  Stress Procedure:  The patient exercised on the treadmill utilizing the Bruce Protocol for 10:41 minutes. The patient stopped due to fatigue and dyspnea She c/o  chest tightness (2/10) at peak exercise. Technetium 30m Sestamibi was injected at peak exercise and myocardial perfusion imaging was performed after a brief delay. Stress ECG: Significant ST abnormalities consistent with ischemia.  QPS Raw Data Images:  Normal; no motion artifact; normal heart/lung ratio. Stress Images:  There is decreased uptake in the inferior wall. Rest Images:  Normal homogeneous uptake in all areas of the myocardium. Subtraction (SDS):  These findings are consistent with ischemia. Transient Ischemic Dilatation (Normal <1.22):  1.07 Lung/Heart Ratio (Normal <0.45):  0.29  Quantitative Gated Spect Images QGS EDV:  97 ml QGS ESV:  38 ml  Impression Exercise Capacity:  Good exercise capacity. BP Response:  Normal blood pressure response. Clinical Symptoms:  Mild chest pain/dyspnea. ECG Impression:  Significant ST abnormalities consistent with ischemia. Comparison with Prior Nuclear Study: No images to compare  Overall Impression:  Intermediate stress nuclear study. There is a medium size area of moderate reversible ischemia involving the basal inferior, mid-inferior, and apical inferior and basal inferoseptal segments. EKG reveals significant ischemic ST depression in inferolateral leads.  LV Ejection Fraction: 60%.  LV Wall Motion:  NL LV Function; NL Wall Motion  [employer]

## 2012-11-17 NOTE — Progress Notes (Signed)
ELECTROPHYSIOLOGY CONSULT NOTE  Patient ID: Michele Burns, MRN: [id number], DOB/AGE: [birth year] 77 y.o. Admit date: (Not on file) Date of Consult: 11/17/2012  Primary Physician: Rene Paci, MD Primary Cardiologist: [facility] Chief Complaint:  Abnormal myoview   HPI Michele Burns is a 77 y.o. female seen following an abnormal stress test. She has a strong family history for cardiac disease and has noted exertional chest discomfort with her treadmills. She was admitted for a Myoview that demonstrated prior infarct and moderate peri-infarct ischemia. She ST segment depression assistant and exercise and had accompanying chest discomfort.  Ejection fraction appeared to be normal. I could not visualize LV dilatation.  She also has a history of a couple of episodes of a sharp pain associated with nausea and diaphoresis it lasted hours.  We'll echocardiogram is normal for prior anteroseptal MI although this is not evident on her Myoview scan.    Past Medical History  Diagnosis Date  . Dyslipidemia   . Hx of colonic polyp 2006    diminutive '06, no polyps on 4/09 colo - for repeat 4/14  . IBS (irritable bowel syndrome)   . Erosive gastritis 2006    NSAID precipitated, resolved on f/u EGD  . MIGRAINE HEADACHE   . PULMONARY NODULE   . PVD     normal ABI/TBI 09/2009, chronic L toe pain and vasospasm (Raynauds)  . POSTMENOPAUSAL SYNDROME   . RENAL CYST   . Hypertension   . Hyperlipidemia   . DEPRESSION       Surgical History:  Past Surgical History  Procedure Date  . Tonsillectomy     As a child  . Vaginal hysterectomy   . Excision morton's neuroma 1971    L 3/4 toe space     Home Meds: Prior to Admission medications   Medication Sig Start Date End Date Taking? Authorizing Provider  aspirin 81 MG tablet Take 162 mg by mouth daily.    Yes Historical Provider, MD  Calcium Carbonate (CALTRATE 600) 1500 MG TABS Take 1 tablet by mouth 2 (two) times daily.     Yes Historical  Provider, MD  Cholecalciferol (VITAMIN D) 1000 UNITS capsule Take 1,000 Units by mouth daily.     Yes Historical Provider, MD  co-enzyme Q-10 50 MG capsule Take 50 mg by mouth daily.   Yes Historical Provider, MD  losartan (COZAAR) 50 MG tablet Take 1 tablet (50 mg total) by mouth daily. 04/20/12  Yes Newt Lukes, MD  Magnesium 250 MG TABS Take by mouth 2 (two) times daily.   Yes Historical Provider, MD  nitroGLYCERIN (NITROSTAT) 0.4 MG SL tablet Place 1 tablet (0.4 mg total) under the tongue every 5 (five) minutes as needed for chest pain. 11/09/12  Yes Beatrice Lecher, PA  Omega-3 Fatty Acids (FISH OIL) 1000 MG CPDR Take 1 capsule by mouth 2 (two) times daily.     Yes Historical Provider, MD  Probiotic Product (PROBIOTIC ACIDOPHILUS) CAPS Take 1 tablet by mouth daily.     Yes Historical Provider, MD  promethazine (PHENERGAN) 25 MG tablet Take 0.5-1 tablets (12.5-25 mg total) by mouth every 6 (six) hours as needed for nausea. 11/01/12  Yes Newt Lukes, MD  simvastatin (ZOCOR) 40 MG tablet Take 1 tablet (40 mg total) by mouth at bedtime. 10/18/12  Yes Newt Lukes, MD  venlafaxine XR (EFFEXOR XR) 37.5 MG 24 hr capsule Take 1 capsule (37.5 mg total) by mouth daily. 08/16/12 08/16/13 Yes Newt Lukes, MD  zinc gluconate 50 MG tablet Take 50 mg by mouth daily.     Yes Historical Provider, MD     Allergies:  Allergies  Allergen Reactions  . Codeine     REACTION: severe nausea    History   Social History  . Marital Status: Widowed    Spouse Name: N/A    Number of Children: N/A  . Years of Education: N/A   Occupational History  . desk [employer]    Social History Main Topics  . Smoking status: Former Smoker -- 30 years    Types: Cigarettes    Quit date: 10/12/1980  . Smokeless tobacco: Not on file     Comment: She is widowed since 6-she has 3 grown children and 5 g-kids. Moved to [place] from [place] in 2010 to be close to family  . Alcohol Use: 0.0 oz/week       Comment: 1 per day  . Drug Use: No  . Sexually Active: Not on file   Other Topics Concern  . Not on file   Social History Narrative  . No narrative on file     Family History  Problem Relation Age of Onset  . Heart attack Father 16    deceased  . Heart attack Mother 40    deceased  . Diabetes type I Mother   . Diabetes Mother   . Heart attack Brother 46    deceased  . Diabetes Paternal Grandmother      ROS:  Please see the history of present illness.     All other systems reviewed and negative.    Physical Exam:  Blood pressure 129/93, pulse 48, height 5\' 6"  (1.676 m), weight 131 lb (59.421 kg). General: Well developed, well nourished female in no acute distress. Head: Normocephalic, atraumatic, sclera non-icteric, no xanthomas, nares are without discharge. Lymph Nodes:  none Back: without scoliosis/kyphosis , no CVA tendersness Neck: Negative for carotid bruits. JVD not elevated. Lungs: Clear bilaterally to auscultation without wheezes, rales, or rhonchi. Breathing is unlabored. Heart: RRR with S1 S2. No murmur , rubs, or gallops appreciated. Abdomen: Soft, non-tender, non-distended with normoactive bowel sounds. No hepatomegaly. No rebound/guarding. No obvious abdominal masses. Msk:  Strength and tone appear normal for age. Extremities: No clubbing or cyanosis. No  edema.  Distal pedal pulses are 2+ and equal bilaterally. Skin: Warm and Dry Neuro: Alert and oriented X 3. CN III-XII intact Grossly normal sensory and motor function . Psych:  Responds to questions appropriately with a normal affect.      Labs: Cardiac Enzymes No results found for this basename: CKTOTAL:4,CKMB:4,TROPONINI:4 in the last 72 hours CBC Lab Results  Component Value Date   WBC 5.1 05/24/2012   HGB 13.3 05/24/2012   HCT 39.9 05/24/2012   MCV 95.8 05/24/2012   PLT 209.0 05/24/2012   PROTIME: No results found for this basename: LABPROT:3,INR:3 in the last 72 hours Chemistry No results  found for this basename: NA,K,CL,CO2,BUN,CREATININE,CALCIUM,LABALBU,PROT,BILITOT,ALKPHOS,ALT,AST,GLUCOSE in the last 168 hours Lipids Lab Results  Component Value Date   CHOL 186 05/24/2012   HDL 98.50 05/24/2012   LDLCALC 79 05/24/2012   TRIG 45.0 05/24/2012   BNP No results found for this basename: probnp   Miscellaneous No results found for this basename: DDIMER    Radiology/Studies:  No results found.  EKG: Normal sinus rhythm with evidence of anteroseptal MI with Q waves V1-V3.   Assessment and Plan:   Abnormal Myoview in the context of chest pain.  I reviewed with the patient Dr. [place] and Dr. Everlene Other. results of the stress test. We discussed the options of catheterization hour later the patient elected to pursue it at this juncture. We'll send her to the hospital. She is currently on aspirin. She is on beta blockers. She does take statin therapy. She is currently having no discomfort.    Sherryl Manges

## 2012-11-17 NOTE — H&P (View-Only) (Signed)
[address] , [place]  [address] Phone: [phone], Fax:  [phone]  Date:  11/09/2012   ID:  Michele Burns, DOB 06/01/1936, MRN [id number]  PCP:  Valerie Leschber, MD  Primary Cardiologist:  Dr. Michael Cooper     History of Present Illness: Michele Burns is a 76 y.o. female who presents as an add on for chest pain.  She has a hx of HTN, HL and strong FHx of CAD.  Echo 12/10: EF 55-60%, Gr 1 diast dysfxn, mild MR, mild LAE.  ABIs 09/2009: normal.  Chest CT in 08/2010 demonstrated coronary calcification.  Saw PCP recently for vertiginous symptoms.  She has had 2 episodes of chest pain recently.  This is unusual for her.  She notes right sided chest pain that awoke her from sleep 6-8 weeks ago.  It lasted seconds to minutes.  No associated symptoms.  She felt fine afterward and went back to sleep.  This past weekend she had another episode at work.  She was not doing anything exertional.  She had some jaw pain.  It lasted maybe 2 mins.  No assoc dyspnea, nausea, diaphoresis.  No hx of syncope.  No recent orthopnea, PND, edema.  No hx of exertional chest pain or DOE.  No recent exercise intolerance.   Labs (8/13):  K 4.2, creatinine 0.8, ALT 33, LDL 79, Hgb 13.3, TSH 2.86  Wt Readings from Last 3 Encounters:  11/09/12 138 lb (62.596 kg)  11/01/12 133 lb (60.328 kg)  08/16/12 136 lb 12.8 oz (62.052 kg)     Past Medical History  Diagnosis Date  . Dyslipidemia   . Hx of colonic polyp 2006    diminutive '06, no polyps on 4/09 colo - for repeat 4/14  . IBS (irritable bowel syndrome)   . Erosive gastritis 2006    NSAID precipitated, resolved on f/u EGD  . MIGRAINE HEADACHE   . PULMONARY NODULE   . PVD     normal ABI/TBI 09/2009, chronic L toe pain and vasospasm (Raynauds)  . POSTMENOPAUSAL SYNDROME   . RENAL CYST   . Hypertension   . Hyperlipidemia   . DEPRESSION     Current Outpatient Prescriptions  Medication Sig Dispense Refill    . aspirin 81 MG tablet Take 162 mg by mouth daily.       . Calcium Carbonate (CALTRATE 600) 1500 MG TABS Take 1 tablet by mouth 2 (two) times daily.        . Cholecalciferol (VITAMIN D) 1000 UNITS capsule Take 1,000 Units by mouth daily.        . co-enzyme Q-10 50 MG capsule Take 50 mg by mouth daily.      . losartan (COZAAR) 50 MG tablet Take 1 tablet (50 mg total) by mouth daily.  90 tablet  3  . Magnesium 250 MG TABS Take by mouth 2 (two) times daily.      . Omega-3 Fatty Acids (FISH OIL) 1000 MG CPDR Take 1 capsule by mouth 2 (two) times daily.        . Probiotic Product (PROBIOTIC ACIDOPHILUS) CAPS Take 1 tablet by mouth daily.        . promethazine (PHENERGAN) 25 MG tablet Take 0.5-1 tablets (12.5-25 mg total) by mouth every 6 (six) hours as needed for nausea.  30 tablet  1  . simvastatin (ZOCOR) 40 MG tablet Take 1 tablet (40 mg total) by mouth at bedtime.  90 tablet  2  .   venlafaxine XR (EFFEXOR XR) 37.5 MG 24 hr capsule Take 1 capsule (37.5 mg total) by mouth daily.  90 capsule  3  . zinc gluconate 50 MG tablet Take 50 mg by mouth daily.          Allergies:    Allergies  Allergen Reactions  . Codeine     REACTION: severe nausea    Social History:  The patient  reports that she quit smoking about 32 years ago. Her smoking use included Cigarettes. She quit after 30 years of use. She does not have any smokeless tobacco history on file. She reports that she drinks alcohol. She reports that she does not use illicit drugs.   Family History:  The patient's family history includes Diabetes in her mother and paternal grandmother; Diabetes type I in her mother; Heart attack (age of onset:46) in her brother; Heart attack (age of onset:58) in her father; and Heart attack (age of onset:70) in her mother.   ROS:  Please see the history of present illness.   All other systems reviewed and negative.   PHYSICAL EXAM: VS:  BP 130/68  Pulse 54  Ht 5' 6" (1.676 m)  Wt 138 lb (62.596 kg)  BMI  22.27 kg/m2 Well nourished, well developed, in no acute distress HEENT: normal Neck: no JVD Cardiac:  normal S1, S2; RRR; no murmur Lungs:  clear to auscultation bilaterally, no wheezing, rhonchi or rales Abd: soft, nontender, no hepatomegaly Ext: no edema Skin: warm and dry Neuro:  CNs 2-12 intact, no focal abnormalities noted  EKG:  Sinus brady, HR 54, LAD, NSSTTW changes, septal Q waves, no change from prior tracing   ASSESSMENT AND PLAN:  1. Chest Pain:  She has atypical symptoms.  But she has several risk factors including coronary calcification on prior chest CT, strong FHx, HTN, HL, prior smoking hx.  I will arrange an ETT-Myoview.  Continue ASA, statin.  Will give Rx for NTG to use prn. 2. Hypertension:  Controlled.  Continue current therapy.  3. Hyperlipidemia:  Continue statin. 4. Disposition:  Follow up with Dr. Michael Cooper in 1 month.  Signed, Derreon Consalvo, PA-C  12:49 PM 11/09/2012    

## 2012-11-17 NOTE — CV Procedure (Signed)
   Cardiac Catheterization Procedure Note  Name: Michele Burns MRN: [id number] DOB: 03-30-1936  Procedure: Left Heart Cath, Selective Coronary Angiography, LV angiography  Indication: 77 year old white female with history of hypercholesterolemia and hypertension. She has had 2 episodes recently of chest pain at rest. He denies any exertional symptoms. Stress Myoview study today was abnormal showing evidence of inferior wall infarct and ischemia.   Procedural Details: The right wrist was prepped, draped, and anesthetized with 1% lidocaine. Using the modified Seldinger technique, a 5 French sheath was introduced into the right radial artery. 3 mg of verapamil was administered through the sheath, weight-based unfractionated heparin was administered intravenously. There was significant tortuosity in the brachial region. There was also significant spasm of the radial and brachial vessels. This did improve with intra-arterial nitroglycerin. Standard Judkins catheters were used for selective coronary angiography and left ventriculography. The right coronary was very difficult to engage with a catheter. Multiple catheters were used. We were finally able to engage the right coronary with a  MP 1 guide catheter. The right coronary has a downward takeoff that is also out of plane. Catheter exchanges were performed over an exchange length guidewire. There were no immediate procedural complications. A TR band was used for radial hemostasis at the completion of the procedure.  The patient was transferred to the post catheterization recovery area for further monitoring.  Procedural Findings: Hemodynamics: AO 163/95 with a mean of 171 mmHg LV 163/15 mmHg  Coronary angiography: Coronary dominance: right  Left mainstem: The left main coronary is mildly calcified in the distal vessel. It has no significant obstructive disease.  Left anterior descending (LAD): The LAD is heavily calcified in the proximal and  mid vessel. There is 30% disease at the ostium. In the mid vessel at the takeoff of the second diagonal there is 40% disease in the LAD. The first diagonal is without significant disease. The second diagonal has 40-50% disease at its origin.  Left circumflex (LCx): The left circumflex is a large vessel that gives rise to 2 marginal branches in the mid vessel. There is 30-40% disease at the bifurcation of the marginals.  Right coronary artery (RCA): The right coronary has a downward takeoff that is somewhat out of plane. After a great deal of difficulty we were able to engage it with a multipurpose 1 guide catheter. The right coronary is severely calcified in the proximal, mid, and distal vessel. There is a long segmental 80% stenosis in the mid vessel followed by a segmental 80% stenosis at the crux.  Left ventriculography: Left ventricular systolic function is normal, LVEF is estimated at 55-60%, there is mild anterior wall hypokinesis, there is no significant mitral regurgitation   Final Conclusions:   1. Single vessel obstructive coronary disease. The right coronary has severe calcification and moderately severe disease in the mid vessel and crux. 2. Good left ventricular function.  Recommendations: All reviewed treatment options with the patient including medical therapy versus percutaneous intervention. Percutaneous intervention would be difficult and rotational atherectomy would be necessary to achieve optimal result.  Theron Arista [facility] 11/17/2012, 7:04 PM

## 2012-11-18 ENCOUNTER — Encounter (HOSPITAL_COMMUNITY): Payer: Self-pay | Admitting: Cardiology

## 2012-11-18 ENCOUNTER — Encounter: Payer: Self-pay | Admitting: Cardiovascular Disease

## 2012-11-18 ENCOUNTER — Other Ambulatory Visit: Payer: Self-pay | Admitting: Cardiology

## 2012-11-18 ENCOUNTER — Encounter: Payer: Self-pay | Admitting: *Deleted

## 2012-11-18 DIAGNOSIS — I2089 Other forms of angina pectoris: Secondary | ICD-10-CM | POA: Diagnosis present

## 2012-11-18 DIAGNOSIS — I208 Other forms of angina pectoris: Secondary | ICD-10-CM | POA: Diagnosis present

## 2012-11-18 DIAGNOSIS — I10 Essential (primary) hypertension: Secondary | ICD-10-CM

## 2012-11-18 DIAGNOSIS — I2510 Atherosclerotic heart disease of native coronary artery without angina pectoris: Secondary | ICD-10-CM | POA: Diagnosis present

## 2012-11-18 DIAGNOSIS — I25810 Atherosclerosis of coronary artery bypass graft(s) without angina pectoris: Secondary | ICD-10-CM

## 2012-11-18 DIAGNOSIS — I209 Angina pectoris, unspecified: Secondary | ICD-10-CM

## 2012-11-18 DIAGNOSIS — R9439 Abnormal result of other cardiovascular function study: Secondary | ICD-10-CM

## 2012-11-18 DIAGNOSIS — E785 Hyperlipidemia, unspecified: Secondary | ICD-10-CM

## 2012-11-18 MED ORDER — LOSARTAN POTASSIUM 50 MG PO TABS
50.0000 mg | ORAL_TABLET | Freq: Every day | ORAL | Status: DC
  Filled 2012-11-18: qty 1

## 2012-11-18 MED ORDER — CLOPIDOGREL BISULFATE 75 MG PO TABS: 75.0000 mg | ORAL_TABLET | Freq: Every day | ORAL | Status: DC

## 2012-11-18 MED ORDER — ISOSORBIDE MONONITRATE ER 30 MG PO TB24: 30.0000 mg | ORAL_TABLET | Freq: Every day | ORAL | Status: DC

## 2012-11-18 MED ORDER — CLOPIDOGREL BISULFATE 75 MG PO TABS
300.0000 mg | ORAL_TABLET | Freq: Once | ORAL | Status: AC
  Administered 2012-11-18: 300 mg via ORAL
  Filled 2012-11-18: qty 4

## 2012-11-18 MED ORDER — CLOPIDOGREL BISULFATE 75 MG PO TABS
75.0000 mg | ORAL_TABLET | Freq: Every day | ORAL | Status: DC
  Filled 2012-11-18: qty 1

## 2012-11-18 MED ORDER — ASPIRIN EC 81 MG PO TBEC: 81.0000 mg | DELAYED_RELEASE_TABLET | Freq: Every day | ORAL | Status: DC

## 2012-11-18 MED ORDER — ISOSORBIDE MONONITRATE ER 30 MG PO TB24
30.0000 mg | ORAL_TABLET | Freq: Every day | ORAL | Status: DC
  Filled 2012-11-18: qty 1

## 2012-11-18 NOTE — Telephone Encounter (Signed)
This encounter was created in error - please disregard.

## 2012-11-18 NOTE — Progress Notes (Signed)
   TELEMETRY: Reviewed telemetry pt in NSR, sinus bradycardia: Filed Vitals:   11/17/12 1930 11/17/12 2000 11/17/12 2100 11/18/12 0300  BP: 125/55 126/79 130/65 152/79  Pulse: 62 58 53 56  Temp:   97.7 F (36.5 C) 97.4 F (36.3 C)  TempSrc:    Oral  Resp: 16 13 11 16   SpO2: 100% 100% 99% 100%    Intake/Output Summary (Last 24 hours) at 11/18/12 0747 Last data filed at 11/17/12 2300  Gross per 24 hour  Intake    200 ml  Output      0 ml  Net    200 ml    SUBJECTIVE Patient states she had some heaviness in chest last night. Feels well this am. No problems with cath site.  LABS: Basic Metabolic Panel:  Basename 11/17/12 1905 11/17/12 1500  NA -- 141  K -- 4.0  CL -- 104  CO2 -- 27  GLUCOSE -- 83  BUN -- 18  CREATININE 0.73 0.76  CALCIUM -- 9.6  MG -- --  PHOS -- --   CBC:  Basename 11/17/12 1905 11/17/12 1500  WBC 5.2 4.5  NEUTROABS -- --  HGB 13.0 13.2  HCT 37.8 37.8  MCV 87.9 87.3  PLT 203 221    Radiology/Studies:  No results found.  PHYSICAL EXAM General: Well developed, well nourished, in no acute distress. Head: Normocephalic, atraumatic, sclera non-icteric, no xanthomas, nares are without discharge. Neck: Negative for carotid bruits. JVD not elevated. Lungs: Clear bilaterally to auscultation without wheezes, rales, or rhonchi. Breathing is unlabored. Heart: RRR S1 S2 without murmurs, rubs, or gallops.  Abdomen: Soft, non-tender, non-distended with normoactive bowel sounds. No hepatomegaly. No rebound/guarding. No obvious abdominal masses. Msk:  Strength and tone appears normal for age. Extremities: No clubbing, cyanosis or edema.  Distal pedal pulses are 2+ and equal bilaterally. No radial hematoma. Neuro: Alert and oriented X 3. Moves all extremities spontaneously. Psych:  Responds to questions appropriately with a normal affect.  ASSESSMENT AND PLAN: 1. CAD with severe RCA disease- heavily calcified. Abnormal Myoview with inferior wall  ischemia. Intermittent symptoms of rest angina. Cath results reviewed in detail with patient and family. Discussed options for treatment including medical therapy and PCI. Medical therapy limited by bradycardia (can't take beta blockers). PCI complicated by heavy calcification and difficult take off of RCA. Would need rotational atherectomy to achieve optimal results. I reviewed in detail risks of PCI to include bleeding, MI, CVA, no reflow, perforation, spasm, dye reaction, nephrotoxicity, and death. Compared to medical therapy there is probably no difference in mortality risk or risk of MI but PCI may offer better control of symptoms. After discussion patient voices a strong desire to proceed with PCI especially given her very active lifestyle. Will load with Plavix today. Continue ASA and Plavix. Add low dose isosorbide and have sl Ntg as needed. Will schedule for PCI next Tuesday.  2. Htn continue losartan  3. Hyperlipidemia continue simvastatin.  Principal Problem:  *Angina at rest Active Problems:  HYPERLIPIDEMIA  HYPERTENSION, UNSPECIFIED  Abnormal cardiovascular stress test    Signed, Einar Nolasco [place] MD,FACC 11/18/2012 7:47 AM

## 2012-11-18 NOTE — Discharge Summary (Signed)
Patient seen and examined and history reviewed. Agree with above findings and plan. See earlier rounding note.  Theron Arista JordanMD 11/18/2012 10:20 AM

## 2012-11-18 NOTE — Telephone Encounter (Signed)
Left message to call back  

## 2012-11-18 NOTE — Telephone Encounter (Signed)
Pt calling re question re an insurance issue the hospital told to to talk to lauren

## 2012-11-18 NOTE — Discharge Summary (Signed)
Discharge Summary   Patient ID: Michele Burns MRN: [id number], DOB/AGE: [birth year] 77 y.o.  Primary MD: Rene Paci, MD Primary Cardiologist: Dr. Tonny Bollman  Admit date: 11/17/2012 D/C date:     11/18/2012      Primary Discharge Diagnoses:  1. Coronary Artery Disease  -  Severe calcification and moderately severe disease in the mid RCA, otherwise nonobstructive disease and normal LVEF at 55-60%. w/ plans for PCI 11/22/12  - Loaded with Plavix and dc'd on ASA, Plavix, Imdur, Statin, SL NTG  Secondary Discharge Diagnoses:  . Hx of colonic polyp 2006    diminutive '06, no polyps on 4/09 colo - for repeat 4/14  . IBS (irritable bowel syndrome)   . Erosive gastritis 2006    NSAID precipitated, resolved on f/u EGD  . MIGRAINE HEADACHE   . PULMONARY NODULE   . PVD     normal ABI/TBI 09/2009, chronic L toe pain and vasospasm (Raynauds)  . POSTMENOPAUSAL SYNDROME   . RENAL CYST   . Hypertension   . Hyperlipidemia   . DEPRESSION   . Sinus bradycardia     Allergies Allergies  Allergen Reactions  . Codeine     REACTION: severe nausea    Diagnostic Studies/Procedures:   11/17/12 - Cardiac Cath Hemodynamics:  AO 163/95 with a mean of 171 mmHg  LV 163/15 mmHg  Coronary angiography:  Coronary dominance: right  Left mainstem: The left main coronary is mildly calcified in the distal vessel. It has no significant obstructive disease.  Left anterior descending (LAD): The LAD is heavily calcified in the proximal and mid vessel. There is 30% disease at the ostium. In the mid vessel at the takeoff of the second diagonal there is 40% disease in the LAD. The first diagonal is without significant disease. The second diagonal has 40-50% disease at its origin.  Left circumflex (LCx): The left circumflex is a large vessel that gives rise to 2 marginal branches in the mid vessel. There is 30-40% disease at the bifurcation of the marginals.  Right coronary artery (RCA): The right  coronary has a downward takeoff that is somewhat out of plane. After a great deal of difficulty we were able to engage it with a multipurpose 1 guide catheter. The right coronary is severely calcified in the proximal, mid, and distal vessel. There is a long segmental 80% stenosis in the mid vessel followed by a segmental 80% stenosis at the crux.  Left ventriculography: Left ventricular systolic function is normal, LVEF is estimated at 55-60%, there is mild anterior wall hypokinesis, there is no significant mitral regurgitation  Final Conclusions:  1. Single vessel obstructive coronary disease. The right coronary has severe calcification and moderately severe disease in the mid vessel and crux.  2. Good left ventricular function.  Recommendations: All reviewed treatment options with the patient including medical therapy versus percutaneous intervention. Percutaneous intervention would be difficult and rotational atherectomy would be necessary to achieve optimal result.   History of Present Illness: 77 y.o. female with chest pain and an abnormal myoview who presented to [facility] on 11/17/12 for cardiac catheterization.  Myoview 11/17/12 - There is a medium size area of moderate reversible ischemia involving the basal inferior, mid-inferior, and apical inferior and basal inferoseptal segments. EKG reveals significant ischemic ST depression in inferolateral leads. LV Ejection Fraction: 60%  Hospital Course: Michele Burns underwent cardiac catheterization 11/17/12 which revealed severe calcification and moderately severe disease in the mid RCA, otherwise nonobstructive disease and normal  LVEF at 55-60%. Medical therapy vs PTCA/PCI were discussed with Michele Burns. Medical therapy would be limited by bradycardia and PCI would be complicated by heavy calcification and difficult take off of the RCA. It was felt rotational atherectomy would be necessary to achieve optimal results. After discussion the  patient voiced a strong desire to proceed with PCI especially given her very active lifestyle. She was loaded with Plavix and discharged on ASA, Plavix, Imdur, Statin, SL NTG, and Losartan with plans for PCI on 11/22/12.  She was seen and evaluated by Dr. [place] who felt she was stable for discharge home with plans to return on 11/22/12 for PCI then follow up with Dr. Excell Seltzer as scheduled below.  Discharge Vitals: Blood pressure 152/79, pulse 56, temperature 97.4 F (36.3 C), temperature source Oral, resp. rate 16, SpO2 100.00%.  Labs: Lab Results  Component Value Date   WBC 5.2 11/17/2012   HGB 13.0 11/17/2012   HCT 37.8 11/17/2012   MCV 87.9 11/17/2012   PLT 203 11/17/2012    Lab 11/17/12 1500  NA 141  K 4.0  CL 104  CO2 27  BUN 18  CREATININE 0.76  CALCIUM 9.6  GLUCOSE 83    Discharge Medications     Medication List     As of 11/18/2012  9:40 AM    TAKE these medications         aspirin EC 81 MG tablet   Take 1 tablet (81 mg total) by mouth daily.      CALTRATE 600 1500 MG Tabs   Generic drug: Calcium Carbonate   Take 1 tablet by mouth 2 (two) times daily.      clopidogrel 75 MG tablet   Commonly known as: PLAVIX   Take 1 tablet (75 mg total) by mouth daily with breakfast.      co-enzyme Q-10 50 MG capsule   Take 50 mg by mouth daily.      Fish Oil 1000 MG Cpdr   Take 1 capsule by mouth 2 (two) times daily.      isosorbide mononitrate 30 MG 24 hr tablet   Commonly known as: IMDUR   Take 1 tablet (30 mg total) by mouth daily.      losartan 50 MG tablet   Commonly known as: COZAAR   Take 1 tablet (50 mg total) by mouth daily.      Magnesium 250 MG Tabs   Take 250 mg by mouth 2 (two) times daily.      nitroGLYCERIN 0.4 MG SL tablet   Commonly known as: NITROSTAT   Place 1 tablet (0.4 mg total) under the tongue every 5 (five) minutes as needed for chest pain.      PROBIOTIC ACIDOPHILUS Caps   Take 1 tablet by mouth daily.      promethazine 25 MG tablet    Commonly known as: PHENERGAN   Take 0.5-1 tablets (12.5-25 mg total) by mouth every 6 (six) hours as needed for nausea.      simvastatin 40 MG tablet   Commonly known as: ZOCOR   Take 1 tablet (40 mg total) by mouth at bedtime.      venlafaxine XR 37.5 MG 24 hr capsule   Commonly known as: EFFEXOR-XR   Take 1 capsule (37.5 mg total) by mouth daily.      Vitamin D 1000 UNITS capsule   Take 1,000 Units by mouth daily.      zinc gluconate 50 MG tablet   Take 50 mg  by mouth daily.          Disposition   Discharge Orders    Future Appointments: Provider: Department: Dept Phone: Center:   11/30/2012 1:15 PM Tonny Bollman, MD [facility] Main Office [place]) [phone] [id number]     Future Orders Please Complete By Expires   Diet - low sodium heart healthy      Increase activity slowly      Discharge instructions      Comments:   * Please take all medications as prescribed and bring them with you to your office visit.  * KEEP WRIST CATHETERIZATION SITE CLEAN AND DRY. Call the office for any signs of bleeding, pus, swelling, increased pain, or any other concerns. * NO HEAVY LIFTING (>10lbs) OR SEXUAL ACTIVITY X 7 DAYS. * NO DRIVING X 2-3 DAYS. * NO SOAKING YOUR WRIST IN BATHS, HOT TUBS, POOLS, ETC., X 7 DAYS.     Follow-up Information    Follow up with [facility]. On 11/22/2012. (Please arrive at 7:00am for your 9:00 procedure)    Contact information:   Short Stay, 2nd floor [address] [place] [address] [phone]      Follow up with Tonny Bollman, MD. On 11/30/2012. (1:15)    Contact information:   [facility] [address] [place] [address] [phone]           Outstanding Labs/Studies:  1. Interventional cardiac cath 11/22/12 w/ BMET and CBC  Duration of Discharge Encounter: Greater than 30 minutes including physician and PA time.  Signed,  Antolin PA-C 11/18/2012, 9:40 AM

## 2012-11-22 ENCOUNTER — Encounter (HOSPITAL_COMMUNITY)
Admission: RE | Disposition: A | Discharge status: Home / Self Care | Payer: Self-pay | Source: Ambulatory Visit | Attending: Cardiology

## 2012-11-22 ENCOUNTER — Encounter (HOSPITAL_COMMUNITY): Payer: Self-pay | Admitting: General Practice

## 2012-11-22 ENCOUNTER — Ambulatory Visit (HOSPITAL_COMMUNITY)
Admission: RE | Admit: 2012-11-22 | Discharge: 2012-11-23 | Disposition: A | Discharge status: Home / Self Care | Payer: Medicare Other | Source: Ambulatory Visit | Attending: Cardiology | Admitting: Cardiology

## 2012-11-22 DIAGNOSIS — I25810 Atherosclerosis of coronary artery bypass graft(s) without angina pectoris: Secondary | ICD-10-CM

## 2012-11-22 DIAGNOSIS — I209 Angina pectoris, unspecified: Secondary | ICD-10-CM | POA: Insufficient documentation

## 2012-11-22 DIAGNOSIS — I2584 Coronary atherosclerosis due to calcified coronary lesion: Secondary | ICD-10-CM | POA: Insufficient documentation

## 2012-11-22 DIAGNOSIS — I2510 Atherosclerotic heart disease of native coronary artery without angina pectoris: Secondary | ICD-10-CM | POA: Diagnosis present

## 2012-11-22 DIAGNOSIS — I2089 Other forms of angina pectoris: Secondary | ICD-10-CM

## 2012-11-22 DIAGNOSIS — I208 Other forms of angina pectoris: Secondary | ICD-10-CM

## 2012-11-22 DIAGNOSIS — I2119 ST elevation (STEMI) myocardial infarction involving other coronary artery of inferior wall: Secondary | ICD-10-CM

## 2012-11-22 DIAGNOSIS — I10 Essential (primary) hypertension: Secondary | ICD-10-CM | POA: Diagnosis present

## 2012-11-22 DIAGNOSIS — R9439 Abnormal result of other cardiovascular function study: Secondary | ICD-10-CM

## 2012-11-22 DIAGNOSIS — E785 Hyperlipidemia, unspecified: Secondary | ICD-10-CM | POA: Diagnosis present

## 2012-11-22 HISTORY — PX: PERCUTANEOUS CORONARY ROTOBLATOR INTERVENTION (PCI-R): SHX5484

## 2012-11-22 HISTORY — PX: OTHER SURGICAL HISTORY: SHX169

## 2012-11-22 HISTORY — PX: PERCUTANEOUS CORONARY STENT INTERVENTION (PCI-S): SHX5485

## 2012-11-22 LAB — BASIC METABOLIC PANEL
BUN: 23 mg/dL (ref 6–23)
CO2: 26 mEq/L (ref 19–32)
Chloride: 103 mEq/L (ref 96–112)
Creatinine, Ser: 0.89 mg/dL (ref 0.50–1.10)
GFR calc Af Amer: 72 mL/min — ABNORMAL LOW (ref >90)
Potassium: 3.8 mEq/L (ref 3.5–5.1)

## 2012-11-22 LAB — CBC
HCT: 36 % (ref 36.0–46.0)
Hemoglobin: 12.1 g/dL (ref 12.0–15.0)
MCHC: 33.6 g/dL (ref 30.0–36.0)
MCV: 88.5 fL (ref 78.0–100.0)
RDW: 13.3 % (ref 11.5–15.5)
WBC: 5.2 10*3/uL (ref 4.0–10.5)

## 2012-11-22 LAB — POCT ACTIVATED CLOTTING TIME: Activated Clotting Time: 377 seconds

## 2012-11-22 SURGERY — PERCUTANEOUS CORONARY STENT INTERVENTION (PCI-S)
Anesthesia: LOCAL

## 2012-11-22 MED ORDER — ASPIRIN 81 MG PO CHEW
324.0000 mg | CHEWABLE_TABLET | ORAL | Status: AC
  Administered 2012-11-22: 324 mg via ORAL
  Filled 2012-11-22: qty 4

## 2012-11-22 MED ORDER — CLOPIDOGREL BISULFATE 75 MG PO TABS
75.0000 mg | ORAL_TABLET | Freq: Every day | ORAL | Status: DC
  Administered 2012-11-23: 75 mg via ORAL
  Filled 2012-11-22: qty 1

## 2012-11-22 MED ORDER — MIDAZOLAM HCL 2 MG/2ML IJ SOLN
INTRAMUSCULAR | Status: AC
  Filled 2012-11-22: qty 2

## 2012-11-22 MED ORDER — OMEGA-3-ACID ETHYL ESTERS 1 G PO CAPS
1.0000 g | ORAL_CAPSULE | Freq: Two times a day (BID) | ORAL | Status: DC
  Filled 2012-11-22 (×4): qty 1

## 2012-11-22 MED ORDER — VERAPAMIL HCL 2.5 MG/ML IV SOLN
INTRAVENOUS | Status: AC
  Filled 2012-11-22: qty 4

## 2012-11-22 MED ORDER — BIVALIRUDIN 250 MG IV SOLR
INTRAVENOUS | Status: AC
  Filled 2012-11-22: qty 250

## 2012-11-22 MED ORDER — CALCIUM CARBONATE 1250 (500 CA) MG PO TABS
1250.0000 mg | ORAL_TABLET | Freq: Two times a day (BID) | ORAL | Status: DC
  Filled 2012-11-22 (×4): qty 1

## 2012-11-22 MED ORDER — SIMVASTATIN 40 MG PO TABS
40.0000 mg | ORAL_TABLET | Freq: Every day | ORAL | Status: DC
  Administered 2012-11-22: 40 mg via ORAL
  Filled 2012-11-22 (×2): qty 1

## 2012-11-22 MED ORDER — HEPARIN (PORCINE) IN NACL 2-0.9 UNIT/ML-% IJ SOLN
INTRAMUSCULAR | Status: AC
  Filled 2012-11-22: qty 1000

## 2012-11-22 MED ORDER — RISAQUAD PO CAPS
1.0000 | ORAL_CAPSULE | Freq: Every day | ORAL | Status: DC
  Administered 2012-11-22: 18:00:00 1 via ORAL
  Filled 2012-11-22 (×2): qty 1

## 2012-11-22 MED ORDER — SODIUM CHLORIDE 0.9 % IV SOLN
1.0000 mL/kg/h | INTRAVENOUS | Status: DC
  Administered 2012-11-22: 1 mL/kg/h via INTRAVENOUS

## 2012-11-22 MED ORDER — DIAZEPAM 5 MG PO TABS
5.0000 mg | ORAL_TABLET | ORAL | Status: AC
  Administered 2012-11-22: 5 mg via ORAL
  Filled 2012-11-22: qty 1

## 2012-11-22 MED ORDER — NITROGLYCERIN IN D5W 200-5 MCG/ML-% IV SOLN
INTRAVENOUS | Status: AC
  Filled 2012-11-22: qty 250

## 2012-11-22 MED ORDER — SODIUM CHLORIDE 0.9 % IV SOLN
1.0000 mL/kg/h | INTRAVENOUS | Status: AC
Start: 2012-11-22 — End: 2012-11-22

## 2012-11-22 MED ORDER — LIDOCAINE HCL (PF) 1 % IJ SOLN
INTRAMUSCULAR | Status: AC
  Filled 2012-11-22: qty 30

## 2012-11-22 MED ORDER — NITROGLYCERIN 0.4 MG SL SUBL: 0.4000 mg | SUBLINGUAL_TABLET | SUBLINGUAL | Status: DC | PRN

## 2012-11-22 MED ORDER — HEPARIN SODIUM (PORCINE) 1000 UNIT/ML IJ SOLN
INTRAMUSCULAR | Status: AC
  Filled 2012-11-22: qty 1

## 2012-11-22 MED ORDER — CALCIUM CARBONATE 1500 (600 CA) MG PO TABS: 1.0000 | ORAL_TABLET | Freq: Two times a day (BID) | ORAL | Status: DC

## 2012-11-22 MED ORDER — FENTANYL CITRATE 0.05 MG/ML IJ SOLN
INTRAMUSCULAR | Status: AC
  Filled 2012-11-22: qty 2

## 2012-11-22 MED ORDER — ACETAMINOPHEN 325 MG PO TABS: 650.0000 mg | ORAL_TABLET | ORAL | Status: DC | PRN

## 2012-11-22 MED ORDER — VENLAFAXINE HCL ER 37.5 MG PO CP24
37.5000 mg | ORAL_CAPSULE | Freq: Every day | ORAL | Status: DC
  Administered 2012-11-22: 37.5 mg via ORAL
  Filled 2012-11-22 (×2): qty 1

## 2012-11-22 MED ORDER — ONDANSETRON HCL 4 MG/2ML IJ SOLN: 4.0000 mg | Freq: Four times a day (QID) | INTRAMUSCULAR | Status: DC | PRN

## 2012-11-22 MED ORDER — LOSARTAN POTASSIUM 50 MG PO TABS
50.0000 mg | ORAL_TABLET | Freq: Every day | ORAL | Status: DC
  Administered 2012-11-22: 18:00:00 50 mg via ORAL
  Filled 2012-11-22 (×2): qty 1

## 2012-11-22 MED ORDER — VITAMIN D 1000 UNITS PO TABS
1000.0000 [IU] | ORAL_TABLET | Freq: Every day | ORAL | Status: DC
  Filled 2012-11-22 (×2): qty 1

## 2012-11-22 MED ORDER — ASPIRIN EC 81 MG PO TBEC
81.0000 mg | DELAYED_RELEASE_TABLET | Freq: Every day | ORAL | Status: DC
  Filled 2012-11-22: qty 1

## 2012-11-22 NOTE — CV Procedure (Signed)
   CARDIAC CATH NOTE  Name: Michele Burns MRN: [id number] DOB: 04-Jan-1936  Procedure: PTCA, rotational atherectomy, and stenting of the RCA  Indication: 77 year old white female with recent symptoms of rest angina, class III. Myoview study showed a moderately large area of inferior wall ischemia. Diagnostic angiogram demonstrated diffuse moderate to severe stenoses in the mid RCA up to 80-90% with heavy calcification.  Procedural Details: The right groin was prepped, draped, and anesthetized with 1% lidocaine. Using the modified Seldinger technique, a 7 Fr sheath was introduced into the right femoral artery.  A 6 French sheath was introduced into the right femoral vein. A transvenous pacemaker was inserted under fluoroscopic guidance into the right ventricular apex. Weight-based bivalirudin was given for anticoagulation. Once a therapeutic ACT was achieved, a 7 [place] multipurpose A1 guide with sideholes catheter was inserted.  A Rotafloppy coronary guidewire was used to cross the lesion.  The lesion was treated initially with a 1.5 mm Rotablator burr. We then performed a second series of rotational atherectomy with a 1.75 mm burr. The proximal lesion was predilated with a 2.75 mm cutting balloon.  The distal portion of the lesion was then stented with a 2.75 x 20 mm Promus stent.  The more proximal portion of the lesion was stented with a 3.0 x 32 mm Promus stent in an overlapping fashion. The proximal portion of the stented segment was postdilated with a 3.25 mm noncompliant balloon.  Following PCI, there was 0% residual stenosis and TIMI-3 flow. Final angiography confirmed an excellent result. The patient tolerated the procedure well. There were no immediate procedural complications. Femoral hemostasis was achieved with manual compression. The patient was transferred to the post catheterization recovery area for further monitoring.  Lesion Data: Vessel: RCA Percent stenosis (pre):  80-90% TIMI-flow (pre):  3 Stent:  3.0 x 32 mm Promus stent and 2.75 x 20 mm Promus stent with overlap Percent stenosis (post): 0% TIMI-flow (post): 3  Conclusions: Successful rotational atherectomy and stenting of the mid RCA with drug-eluting stents.  Recommendations: Continue dual antiplatelet therapy for one year.  Theron Arista [facility] 11/22/2012, 1:15 PM

## 2012-11-22 NOTE — H&P (View-Only) (Signed)
   TELEMETRY: Reviewed telemetry pt in NSR, sinus bradycardia: Filed Vitals:   11/17/12 1930 11/17/12 2000 11/17/12 2100 11/18/12 0300  BP: 125/55 126/79 130/65 152/79  Pulse: 62 58 53 56  Temp:   97.7 F (36.5 C) 97.4 F (36.3 C)  TempSrc:    Oral  Resp: 16 13 11 16  SpO2: 100% 100% 99% 100%    Intake/Output Summary (Last 24 hours) at 11/18/12 0747 Last data filed at 11/17/12 2300  Gross per 24 hour  Intake    200 ml  Output      0 ml  Net    200 ml    SUBJECTIVE Patient states she had some heaviness in chest last night. Feels well this am. No problems with cath site.  LABS: Basic Metabolic Panel:  Basename 11/17/12 1905 11/17/12 1500  NA -- 141  K -- 4.0  CL -- 104  CO2 -- 27  GLUCOSE -- 83  BUN -- 18  CREATININE 0.73 0.76  CALCIUM -- 9.6  MG -- --  PHOS -- --   CBC:  Basename 11/17/12 1905 11/17/12 1500  WBC 5.2 4.5  NEUTROABS -- --  HGB 13.0 13.2  HCT 37.8 37.8  MCV 87.9 87.3  PLT 203 221    Radiology/Studies:  No results found.  PHYSICAL EXAM General: Well developed, well nourished, in no acute distress. Head: Normocephalic, atraumatic, sclera non-icteric, no xanthomas, nares are without discharge. Neck: Negative for carotid bruits. JVD not elevated. Lungs: Clear bilaterally to auscultation without wheezes, rales, or rhonchi. Breathing is unlabored. Heart: RRR S1 S2 without murmurs, rubs, or gallops.  Abdomen: Soft, non-tender, non-distended with normoactive bowel sounds. No hepatomegaly. No rebound/guarding. No obvious abdominal masses. Msk:  Strength and tone appears normal for age. Extremities: No clubbing, cyanosis or edema.  Distal pedal pulses are 2+ and equal bilaterally. No radial hematoma. Neuro: Alert and oriented X 3. Moves all extremities spontaneously. Psych:  Responds to questions appropriately with a normal affect.  ASSESSMENT AND PLAN: 1. CAD with severe RCA disease- heavily calcified. Abnormal Myoview with inferior wall  ischemia. Intermittent symptoms of rest angina. Cath results reviewed in detail with patient and family. Discussed options for treatment including medical therapy and PCI. Medical therapy limited by bradycardia (can't take beta blockers). PCI complicated by heavy calcification and difficult take off of RCA. Would need rotational atherectomy to achieve optimal results. I reviewed in detail risks of PCI to include bleeding, MI, CVA, no reflow, perforation, spasm, dye reaction, nephrotoxicity, and death. Compared to medical therapy there is probably no difference in mortality risk or risk of MI but PCI may offer better control of symptoms. After discussion patient voices a strong desire to proceed with PCI especially given her very active lifestyle. Will load with Plavix today. Continue ASA and Plavix. Add low dose isosorbide and have sl Ntg as needed. Will schedule for PCI next Tuesday.  2. Htn continue losartan  3. Hyperlipidemia continue simvastatin.  Principal Problem:  *Angina at rest Active Problems:  HYPERLIPIDEMIA  HYPERTENSION, UNSPECIFIED  Abnormal cardiovascular stress test    Signed, Peter Jordan MD,FACC 11/18/2012 7:47 AM   

## 2012-11-22 NOTE — Interval H&P Note (Signed)
History and Physical Interval Note:  11/22/2012 11:38 AM  Michele Burns  has presented today for surgery, with the diagnosis of cad  The various methods of treatment have been discussed with the patient and family. After consideration of risks, benefits and other options for treatment, the patient has consented to  Procedure(s): PERCUTANEOUS CORONARY STENT INTERVENTION (PCI-S) (N/A) as a surgical intervention .  The patient's history has been reviewed, patient examined, no change in status, stable for surgery.  I have reviewed the patient's chart and labs.  Questions were answered to the patient's satisfaction.     Theron Arista [facility] 11/22/2012 11:38 AM

## 2012-11-22 NOTE — Progress Notes (Signed)
Site area: right groin  Site Prior to Removal:  Level 0  Pressure Applied For 30 MINUTES    Minutes Beginning at 1635  Manual:   yes  Patient Status During Pull:  AAO X 3  Post Pull Groin Site:  Level 0  Post Pull Instructions Given:  yes  Post Pull Pulses Present:  yes  Dressing Applied:  yes  Comments:  Tolerated procedure well

## 2012-11-23 ENCOUNTER — Encounter (HOSPITAL_COMMUNITY): Payer: Self-pay | Admitting: Physician Assistant

## 2012-11-23 DIAGNOSIS — R9439 Abnormal result of other cardiovascular function study: Secondary | ICD-10-CM

## 2012-11-23 DIAGNOSIS — I209 Angina pectoris, unspecified: Secondary | ICD-10-CM

## 2012-11-23 DIAGNOSIS — I25810 Atherosclerosis of coronary artery bypass graft(s) without angina pectoris: Secondary | ICD-10-CM

## 2012-11-23 LAB — CBC
MCH: 30.3 pg (ref 26.0–34.0)
MCHC: 34.2 g/dL (ref 30.0–36.0)
MCV: 88.5 fL (ref 78.0–100.0)
Platelets: 193 10*3/uL (ref 150–400)
RDW: 13.5 % (ref 11.5–15.5)

## 2012-11-23 LAB — BASIC METABOLIC PANEL
Calcium: 9.3 mg/dL (ref 8.4–10.5)
Creatinine, Ser: 0.86 mg/dL (ref 0.50–1.10)
GFR calc Af Amer: 75 mL/min — ABNORMAL LOW (ref >90)

## 2012-11-23 MED FILL — Dextrose Inj 5%: INTRAVENOUS | Qty: 50 | Status: AC

## 2012-11-23 NOTE — Progress Notes (Signed)
   TELEMETRY: Reviewed telemetry pt in NSR with rare PVC: Filed Vitals:   11/22/12 2100 11/23/12 0032 11/23/12 0611 11/23/12 0612  BP: 121/58 127/63  117/67  Pulse:      Temp:  97.7 F (36.5 C) 98.5 F (36.9 C)   TempSrc:  Oral Oral   Resp: 19 27  27   Height:      Weight:  134 lb 14.7 oz (61.2 kg)    SpO2:  97% 98%     Intake/Output Summary (Last 24 hours) at 11/23/12 0720 Last data filed at 11/22/12 2140  Gross per 24 hour  Intake 1085.2 ml  Output    300 ml  Net  785.2 ml    SUBJECTIVE Feels well. No complaints. Denies chest pain or SOB.  LABS: Basic Metabolic Panel:  Recent Labs  95/62/13 0736 11/23/12 0610  NA 139 140  K 3.8 4.4  CL 103 106  CO2 26 27  GLUCOSE 82 96  BUN 23 16  CREATININE 0.89 0.86  CALCIUM 9.7 9.3   CBC:  Recent Labs  11/22/12 0736 11/23/12 0610  WBC 5.2 6.5  HGB 12.1 12.1  HCT 36.0 35.4*  MCV 88.5 88.5  PLT 201 193   ECG: NSR, old septal infarct. LAD.  Radiology/Studies:  No results found.  PHYSICAL EXAM General: Well developed, well nourished, in no acute distress. Head: Normocephalic, atraumatic, sclera non-icteric, no xanthomas, nares are without discharge. Neck: Negative for carotid bruits. JVD not elevated. Lungs: Clear bilaterally to auscultation without wheezes, rales, or rhonchi. Breathing is unlabored. Heart: RRR S1 S2 without murmurs, rubs, or gallops.  Abdomen: Soft, non-tender, non-distended with normoactive bowel sounds. No hepatomegaly. No rebound/guarding. No obvious abdominal masses. Msk:  Strength and tone appears normal for age. Extremities: No clubbing, cyanosis or edema.  Distal pedal pulses are 2+ and equal bilaterally. No right groin hematoma. Neuro: Alert and oriented X 3. Moves all extremities spontaneously. Psych:  Responds to questions appropriately with a normal affect.  ASSESSMENT AND PLAN: 1. CAD with class 3 angina. Positive stress myoview. S/p successful rotablator/DES of RCA. Can stop  Imdur. Continue ASA and Plavix for one year. Stable for discharge today. She has follow up appt. With Dr. Excell Seltzer on Feb 19th. 2. Hyperlipidemia. On Zocor. 3. HTN controlled.  Principal Problem:   Angina at rest Active Problems:   HYPERLIPIDEMIA   HYPERTENSION, UNSPECIFIED   Abnormal cardiovascular stress test    Signed, Peter Swaziland MD,FACC 11/23/2012 7:20 AM

## 2012-11-23 NOTE — Discharge Summary (Signed)
Discharge Summary   Patient ID: Michele Burns MRN: [id number], DOB/AGE: Nov 23, 1935 77 y.o. Admit date: 11/22/2012 D/C date:     11/23/2012  Primary Cardiologist: Excell Seltzer  Primary Discharge Diagnoses:  1. CAD/unstable angina -  s/p PTCA/rotational atherectomy of the mid RCA with 2 DES (abnl myoview as OP and rest pain, diagnostic cath 11/17/12, intervention 11/22/12) 2. Hyperlipidemia 3. HTN 4. Sinus bradycardia - not on BB due to this  Secondary Discharge Diagnoses:  1. Colon polyps 2. IBS 3. H/o erosive gastritis precipitated by NSAIDs, resolved on f/u EGD 4. Migraine headache 5. Pulmonary nodule 6. PVD - normal ABI/TBI 09/2009, chronic L toe pain and vasospasm (Raynauds) 7. Postmenopausal syndrome 8. Renal cyst 9. Depression  Hospital Course: Ms. Farinas is a 77 y/o F with history of HTN and strong family history of CAD. Echo 12/10: EF 55-60%, Gr 1 diast dysfxn, mild MR, mild LAE. ABIs 09/2009: normal. Chest CT in 08/2010 demonstrated coronary calcification. She presented to our office 11/09/12 for evaluation of chest pain. She had an episode of right sided CP that awoke her from sleep 2 months prior without associated symptoms. She was able to go back to sleep. Several days before her OV, she had another episode while at work with associated jaw pain. No associated dyspnea, nausea, diaphoresis. EKG showed NSR, nonspecific ST-T changes, septal Q waves. She was referred for exercise myoview which was abnormal showing medium size area of moderate reversible ischemia involving the basal inferior, mid-inferior, and apical inferior and basal inferoseptal segments, and ischemic depression in the inferolateral leads, EF 60%. Cath was recommended. She underwent a diagnostic cath on 11/17/12 showing severe calcification and moderately severe disease in the mid vessel and crux of the RCA. Results are as follows: Left mainstem: The left main coronary is mildly calcified in the distal vessel. It has  no significant obstructive disease.  Left anterior descending (LAD): The LAD is heavily calcified in the proximal and mid vessel. There is 30% disease at the ostium. In the mid vessel at the takeoff of the second diagonal there is 40% disease in the LAD. The first diagonal is without significant disease. The second diagonal has 40-50% disease at its origin.  Left circumflex (LCx): The left circumflex is a large vessel that gives rise to 2 marginal branches in the mid vessel. There is 30-40% disease at the bifurcation of the marginals.  Right coronary artery (RCA): The right coronary has a downward takeoff that is somewhat out of plane. After a great deal of difficulty we were able to engage it with a multipurpose 1 guide catheter. The right coronary is severely calcified in the proximal, mid, and distal vessel. There is a long segmental 80% stenosis in the mid vessel followed by a segmental 80% stenosis at the crux.  Left ventriculography: Left ventricular systolic function is normal, LVEF is estimated at 55-60%, there is mild anterior wall hypokinesis, there is no significant mitral regurgitation  The patient was started on Imdur, aspirin, and Plavix. Options included medical therapy versus PCI, with PCI likely difficult due to heavy calcification and difficult takeoff of the RCA. Dr. Swaziland had a long discussion with the patient regarding this. Ultimately she was set up for PCI and came into the hospital yesterday for this procedure. She underwent successful PTCA/rotational atherectomy of the mid RCA with 3.0 x 32 mm Promus stent and 2.75 x 20 mm Promus stent with overlap. She tolerated the procedure well. Dr. Swaziland has stopped her Imdur. Dr  Swaziland has seen and examined her and feels she is stable for discharge  Discharge Vitals: Blood pressure 117/67, pulse 53, temperature 97.7 F (36.5 C), temperature source Oral, resp. rate 27, height 5\' 6"  (1.676 m), weight 134 lb 14.7 oz (61.2 kg), SpO2  98.00%.  Labs: Lab Results  Component Value Date   WBC 6.5 11/23/2012   HGB 12.1 11/23/2012   HCT 35.4* 11/23/2012   MCV 88.5 11/23/2012   PLT 193 11/23/2012    Recent Labs Lab 11/23/12 0610  NA 140  K 4.4  CL 106  CO2 27  BUN 16  CREATININE 0.86  CALCIUM 9.3  GLUCOSE 96    Lab Results  Component Value Date   CHOL 186 05/24/2012   HDL 98.50 05/24/2012   LDLCALC 79 05/24/2012   TRIG 45.0 05/24/2012     Diagnostic Studies/Procedures   1. Cardiac catheterization this admission, please see full report and above for summary.   Discharge Medications     Medication List    STOP taking these medications       isosorbide mononitrate 30 MG 24 hr tablet  Commonly known as:  IMDUR      TAKE these medications       aspirin EC 81 MG tablet  Take 1 tablet (81 mg total) by mouth daily.     CALTRATE 600 1500 MG Tabs  Generic drug:  Calcium Carbonate  Take 1 tablet by mouth 2 (two) times daily.     clopidogrel 75 MG tablet  Commonly known as:  PLAVIX  Take 1 tablet (75 mg total) by mouth daily with breakfast.     co-enzyme Q-10 50 MG capsule  Take 50 mg by mouth daily.     Fish Oil 1000 MG Cpdr  Take 1 capsule by mouth 2 (two) times daily.     losartan 50 MG tablet  Commonly known as:  COZAAR  Take 1 tablet (50 mg total) by mouth daily.     Magnesium 250 MG Tabs  Take 250 mg by mouth 2 (two) times daily.     nitroGLYCERIN 0.4 MG SL tablet  Commonly known as:  NITROSTAT  Place 1 tablet (0.4 mg total) under the tongue every 5 (five) minutes as needed for chest pain.     PROBIOTIC ACIDOPHILUS Caps  Take 1 tablet by mouth daily.     promethazine 25 MG tablet  Commonly known as:  PHENERGAN  Take 0.5-1 tablets (12.5-25 mg total) by mouth every 6 (six) hours as needed for nausea.     simvastatin 40 MG tablet  Commonly known as:  ZOCOR  Take 1 tablet (40 mg total) by mouth at bedtime.     venlafaxine XR 37.5 MG 24 hr capsule  Commonly known as:  EFFEXOR XR   Take 1 capsule (37.5 mg total) by mouth daily.     Vitamin D 1000 UNITS capsule  Take 1,000 Units by mouth daily.     zinc gluconate 50 MG tablet  Take 50 mg by mouth daily.        Disposition   The patient will be discharged in stable condition to home.     Discharge Orders   Future Appointments Provider Department Dept Phone   11/30/2012 1:15 PM Tonny Bollman, MD [facility] Main Office [facility]) [phone]   Future Orders Complete By Expires     Diet - low sodium heart healthy  As directed     Increase activity slowly  As directed  Comments:      No driving for 2 days. No lifting over 5 lbs for 1 week. No sexual activity for 1 week. If you notice increased pain, swelling, bleeding or pus, call/return!  You may shower, but no soaking baths/hot tubs/pools for 1 week.      Follow-up Information   Follow up with Tonny Bollman, MD. (11/30/12 at 1:15pm)    Contact information:   [address] [place] [address] [phone]         Duration of Discharge Encounter: Greater than 30 minutes including physician and PA time.  Signed, Ronie Spies PA-C 11/23/2012, 9:34 AM

## 2012-11-23 NOTE — Discharge Summary (Signed)
Patient seen and examined and history reviewed. Agree with above findings and plan. See earlier rounding note.  Michele Burns 11/23/2012 9:39 AM

## 2012-11-23 NOTE — Progress Notes (Signed)
Utilization Review Completed Chanah Tidmore J. Adaia Matthies, RN, BSN, NCM [phone]  

## 2012-11-23 NOTE — Progress Notes (Signed)
CARDIAC REHAB PHASE I   PRE:  Rate/Rhythm: 56SB  BP:  Supine:   Sitting: 124/71  Standing:    SaO2:   MODE:  Ambulation: 600 ft   POST:  Rate/Rhythem: 76SR  BP:  Supine:   Sitting: 123/63  Standing:    SaO2:  0800-0853 Pt walked 600 ft with steady gait. Tolerated well. No CP. Education completed. Understanding voiced. Discussed CRP 2. Pt declined due to work hours. Left brochure in case she changes her mind. She goes to the [facility] now.   Michele Burns

## 2012-11-30 ENCOUNTER — Encounter: Payer: Self-pay | Admitting: Cardiovascular Disease

## 2012-11-30 ENCOUNTER — Ambulatory Visit (INDEPENDENT_AMBULATORY_CARE_PROVIDER_SITE_OTHER): Payer: Medicare Other | Admitting: Cardiovascular Disease

## 2012-11-30 VITALS — BP 118/60 | HR 61 | Ht 66.0 in | Wt 136.0 lb

## 2012-11-30 DIAGNOSIS — I10 Essential (primary) hypertension: Secondary | ICD-10-CM

## 2012-11-30 DIAGNOSIS — I2510 Atherosclerotic heart disease of native coronary artery without angina pectoris: Secondary | ICD-10-CM

## 2012-11-30 NOTE — Progress Notes (Signed)
HPI:  77 year-old woman recently hospitalized with unstable angina. She was diagnosed with severe stenosis of the right coronary artery by cardiac catheterization. She underwent rotational atherectomy and stenting of the right coronary artery February 11. She's done well since hospital discharge. She denies recurrence of chest pain, chest pressure, shortness of breath, or leg swelling. She has no specific complaints today. She was noted to have normal LV function with an ejection fraction of 55-60%.  Outpatient Encounter Prescriptions as of 11/30/2012  Medication Sig Dispense Refill  . aspirin EC 81 MG tablet Take 1 tablet (81 mg total) by mouth daily.      . Calcium Carbonate (CALTRATE 600) 1500 MG TABS Take 1 tablet by mouth 2 (two) times daily.        . Cholecalciferol (VITAMIN D) 1000 UNITS capsule Take 1,000 Units by mouth daily.        . clopidogrel (PLAVIX) 75 MG tablet Take 1 tablet (75 mg total) by mouth daily with breakfast.  30 tablet  6  . co-enzyme Q-10 50 MG capsule Take 50 mg by mouth daily.      Marland Kitchen losartan (COZAAR) 50 MG tablet Take 1 tablet (50 mg total) by mouth daily.  90 tablet  3  . Magnesium 250 MG TABS Take 250 mg by mouth 2 (two) times daily.      . nitroGLYCERIN (NITROSTAT) 0.4 MG SL tablet Place 1 tablet (0.4 mg total) under the tongue every 5 (five) minutes as needed for chest pain.  25 tablet  3  . Omega-3 Fatty Acids (FISH OIL) 1000 MG CPDR Take 1 capsule by mouth 2 (two) times daily.        . Probiotic Product (PROBIOTIC ACIDOPHILUS) CAPS Take 1 tablet by mouth daily.        . promethazine (PHENERGAN) 25 MG tablet Take 0.5-1 tablets (12.5-25 mg total) by mouth every 6 (six) hours as needed for nausea.  30 tablet  1  . simvastatin (ZOCOR) 40 MG tablet Take 1 tablet (40 mg total) by mouth at bedtime.  90 tablet  2  . venlafaxine XR (EFFEXOR XR) 37.5 MG 24 hr capsule Take 1 capsule (37.5 mg total) by mouth daily.  90 capsule  3  . zinc gluconate 50 MG tablet Take 50 mg  by mouth daily.         No facility-administered encounter medications on file as of 11/30/2012.    Allergies  Allergen Reactions  . Codeine     REACTION: severe nausea    Past Medical History  Diagnosis Date  . Hx of colonic polyp 2006    diminutive '06, no polyps on 4/09 colo - for repeat 4/14  . IBS (irritable bowel syndrome)   . Erosive gastritis 2006    NSAID precipitated, resolved on f/u EGD  . MIGRAINE HEADACHE   . PULMONARY NODULE   . PVD     normal ABI/TBI 09/2009, chronic L toe pain and vasospasm (Raynauds)  . POSTMENOPAUSAL SYNDROME   . RENAL CYST   . Hypertension   . Hyperlipidemia   . DEPRESSION   . Coronary artery disease     a. Cath 11/17/12 - severe Ca++ moderately severe disease in the mid RCA, otherwise nonobstructive disease - s/p PTCA/rotational atherectomy & 2 DES to mid RCA 11/22/12.  . Sinus bradycardia     a. not on BB due to this.  . Myocardial infarction     ROS: Negative except as per HPI  BP 118/60  Pulse 61  Ht 5\' 6"  (1.676 m)  Wt 61.689 kg (136 lb)  BMI 21.96 kg/m2  PHYSICAL EXAM: Pt is alert and oriented, pleasant woman in NAD HEENT: normal Neck: JVP - normal, carotids 2+= without bruits Lungs: CTA bilaterally CV: RRR without murmur or gallop Abd: soft, NT, Positive BS, no hepatomegaly Ext: no C/C/E, distal pulses intact and equal. Right radial and right groin sites are intact without tenderness Skin: warm/dry no rash  EKG:  Normal sinus rhythm 61 beats per minute, left anterior fascicular block.  ASSESSMENT AND PLAN: 1. Coronary artery disease, native vessel: Patient is stable following recent PCI. She's tolerating dual antiplatelet therapy with aspirin and Plavix. We'll continue her medical program. She is not on a beta blocker because of resting bradycardia. She remains on simvastatin 40 mg. By she can return to work and perform her normal duties on Monday, March 3.  2. Hyperlipidemia. Patient remains on a statin drug. LDL was 79,  HDL 98, and total cholesterol is 186. Continue same medication.  For followup I would like to see her back in 2 months   Tonny Bollman 11/30/2012 1:59 PM

## 2012-11-30 NOTE — Patient Instructions (Addendum)
Your physician recommends that you schedule a follow-up appointment in: 2 MONTHS with Dr Theodoro Parma can return to work on 12/12/12 with no restrictions.  Your physician recommends that you continue on your current medications as directed. Please refer to the Current Medication list given to you today.

## 2012-12-02 ENCOUNTER — Telehealth: Payer: Self-pay | Admitting: Cardiovascular Disease

## 2012-12-02 ENCOUNTER — Encounter: Payer: Self-pay | Admitting: Cardiovascular Disease

## 2012-12-02 NOTE — Telephone Encounter (Signed)
Per pt she is rtning your call from today and she needs a call back today she is very concerned about her paper work and can not wait

## 2012-12-02 NOTE — Telephone Encounter (Signed)
I spoke with the pt and she is coming to the office now to pick up a copy of her FMLA forms.

## 2012-12-15 ENCOUNTER — Other Ambulatory Visit: Payer: Self-pay

## 2012-12-15 MED ORDER — CLOPIDOGREL BISULFATE 75 MG PO TABS: 75.0000 mg | ORAL_TABLET | Freq: Every day | ORAL | Status: DC

## 2012-12-23 ENCOUNTER — Encounter: Payer: Self-pay | Admitting: Internal Medicine

## 2012-12-23 ENCOUNTER — Ambulatory Visit (INDEPENDENT_AMBULATORY_CARE_PROVIDER_SITE_OTHER): Payer: Medicare Other | Admitting: Internal Medicine

## 2012-12-23 VITALS — BP 110/72 | HR 51 | Temp 97.8°F | Wt 135.8 lb

## 2012-12-23 DIAGNOSIS — I2510 Atherosclerotic heart disease of native coronary artery without angina pectoris: Secondary | ICD-10-CM

## 2012-12-23 DIAGNOSIS — H8110 Benign paroxysmal vertigo, unspecified ear: Secondary | ICD-10-CM

## 2012-12-23 DIAGNOSIS — F3289 Other specified depressive episodes: Secondary | ICD-10-CM

## 2012-12-23 NOTE — Progress Notes (Signed)
Subjective:    Patient ID: Michele Burns, female    DOB: 1936/03/04, 77 y.o.   MRN: [id number]  HPI  Complains of recurrent dizziness Onset 2 days ago, lasted <24h - no symptoms present at this time Symptoms range from mild to moderate, intermittently present. Associated with nausea and bilious vomiting; also severe exhaustion Denies headache, ear pain, fever, or head trauma Not associated with weakness, falls, difficulty speaking or change in hearing/vision Denies history of same Reviewed recent medication changes, but symptoms predate medication change  Past Medical History  Diagnosis Date  . Hx of colonic polyp 2006    diminutive '06, no polyps on 4/09 colo - for repeat 4/14  . IBS (irritable bowel syndrome)   . Erosive gastritis 2006    NSAID precipitated, resolved on f/u EGD  . MIGRAINE HEADACHE   . PULMONARY NODULE   . PVD     normal ABI/TBI 09/2009, chronic L toe pain and vasospasm (Raynauds)  . POSTMENOPAUSAL SYNDROME   . RENAL CYST   . Hypertension   . Hyperlipidemia   . DEPRESSION   . Coronary artery disease     a. Cath 11/17/12 - severe Ca++ moderately severe disease in the mid RCA, otherwise nonobstructive disease - s/p PTCA/rotational atherectomy & 2 DES to mid RCA 11/22/12.  . Sinus bradycardia     a. not on BB due to this.    Review of Systems  Constitutional: Positive for fatigue. Negative for fever, diaphoresis and unexpected weight change.  HENT: Negative for facial swelling and neck pain.   Respiratory: Negative for cough and shortness of breath.   Cardiovascular: Negative for chest pain and palpitations.  Gastrointestinal: Positive for nausea. Negative for abdominal pain, diarrhea, constipation and abdominal distention.  Skin: Negative for color change and rash.  Neurological: Negative for tremors, seizures, syncope, facial asymmetry, speech difficulty, weakness, numbness and headaches.  Psychiatric/Behavioral: Negative for suicidal ideas,  self-injury, dysphoric mood and decreased concentration. The patient is not nervous/anxious.        Objective:   Physical Exam  BP 110/72  Pulse 51  Temp(Src) 97.8 F (36.6 C) (Oral)  Wt 135 lb 12.8 oz (61.598 kg)  BMI 21.93 kg/m2  SpO2 95% Wt Readings from Last 3 Encounters:  12/23/12 135 lb 12.8 oz (61.598 kg)  11/30/12 136 lb (61.689 kg)  11/23/12 134 lb 14.7 oz (61.2 kg)   Constitutional: She appears well-developed and well-nourished. No distress.  HENT: Head: Normocephalic and atraumatic. Ears: B TMs ok, no erythema or effusion; Nose: Nose normal. Mouth/Throat: Oropharynx is clear and moist. No oropharyngeal exudate.  Eyes: Conjunctivae and EOM are normal. Pupils are equal, round, and reactive to light. No scleral icterus.  no nystagmus Neck: Normal range of motion. Neck supple. No JVD or LAD present. No thyromegaly present.  Cardiovascular: Normal rate, regular rhythm and normal heart sounds.  No murmur heard. No BLE edema. Pulmonary/Chest: Effort normal and breath sounds normal. No respiratory distress. She has no wheezes.  Neurological: She is alert and oriented to person, place, and time. No cranial nerve deficit. Coordination, speech, balance and gait are normal.  Psychiatric: She has a normal mood and affect. behavior is normal. Judgment and thought content normal.   Lab Results  Component Value Date   WBC 6.5 11/23/2012   HGB 12.1 11/23/2012   HCT 35.4* 11/23/2012   PLT 193 11/23/2012   GLUCOSE 96 11/23/2012   CHOL 186 05/24/2012   TRIG 45.0 05/24/2012   HDL 98.50 05/24/2012  LDLCALC 79 05/24/2012   ALT 33 05/24/2012   AST 41* 05/24/2012   NA 140 11/23/2012   K 4.4 11/23/2012   CL 106 11/23/2012   CREATININE 0.86 11/23/2012   BUN 16 11/23/2012   CO2 27 11/23/2012   TSH 2.86 05/24/2012   INR 0.95 11/17/2012      Assessment & Plan:  BPPV, ENT and neuro exam benign Hx same prior to CAD/PTCA 11/2012  Will treat symptoms with promethazine 3 times a day to 4 times a day for  next 48 hours, then when necessary Hydration and rest recommended, work excuse provided for next 48 hours Reassurance provided, patient to call if symptoms worse or unimproved consider further testing as needed   Also See problem list. Medications and labs reviewed today.

## 2012-12-23 NOTE — Assessment & Plan Note (Signed)
Reports controlled symptoms associated with chronic insomnia May be exacerbated by acute illness and recent viral syndrome, please see above. Change to Celexa to Effexor August 2013 -no other recent medication changes

## 2012-12-23 NOTE — Patient Instructions (Addendum)
It was good to see you today. We have reviewed your prior records including labs and tests today continue to use promethazine as discussed for dizzy symptoms - Your prescription(s) have been submitted to your pharmacy. Please take as directed and contact our office if you believe you are having problem(s) with the medication(s). other Medications reviewed and updated, no changes at this time.  Please schedule followup in 6 months, call sooner if problems.  Labyrinthitis (Inner Ear Inflammation) Your exam shows you have an inner ear disturbance or labyrinthitis. The cause of this condition is not known. But it may be due to a virus infection. The symptoms of labyrinthitis include vertigo or dizziness made worse by motion, nausea and vomiting. The onset of labyrinthitis may be very sudden. It usually lasts for a few days and then clears up over 1-2 weeks. The treatment of an inner ear disturbance includes bed rest and medications to reduce dizziness, nausea, and vomiting. You should stay away from alcohol, tranquilizers, caffeine, nicotine, or any medicine your doctor thinks may make your symptoms worse. Further testing may be needed to evaluate your hearing and balance system. Please see your doctor or go to the emergency room right away if you have:  Increasing vertigo, earache, loss of hearing, or ear drainage.   Headache, blurred vision, trouble walking, fainting, or fever.   Persistent vomiting, dehydration, or extreme weakness.  Document Released: 09/28/2005 Document Revised: 12/21/2011 Document Reviewed: 03/16/2007 [facility] Patient Information 2013 [place], [place].

## 2012-12-23 NOTE — Assessment & Plan Note (Addendum)
Abnormal stress test prompting cardiac cath February 2014 PTCA to RCA November 22, 2012 reviewed Medical management ongoing, new addition of Plavix, holding beta blocker because of sinus bradycardia Following with cardiology for same, and no recurrent symptoms Lipids at goal, continue statin

## 2013-01-26 ENCOUNTER — Encounter: Payer: Self-pay | Admitting: Internal Medicine

## 2013-02-01 ENCOUNTER — Ambulatory Visit (INDEPENDENT_AMBULATORY_CARE_PROVIDER_SITE_OTHER): Payer: Medicare Other | Admitting: Cardiovascular Disease

## 2013-02-01 ENCOUNTER — Encounter: Payer: Self-pay | Admitting: Cardiovascular Disease

## 2013-02-01 VITALS — BP 118/72 | HR 56 | Ht 66.0 in | Wt 136.6 lb

## 2013-02-01 DIAGNOSIS — I2510 Atherosclerotic heart disease of native coronary artery without angina pectoris: Secondary | ICD-10-CM

## 2013-02-01 DIAGNOSIS — E7800 Pure hypercholesterolemia, unspecified: Secondary | ICD-10-CM

## 2013-02-01 NOTE — Progress Notes (Signed)
HPI:  77 year old woman presenting for followup evaluation. She underwent PCI of the right coronary artery in February 2014 after presenting with symptoms of unstable angina. She was treated with overlapping drug-eluting stents and adjunctive rotational atherectomy of the RCA. Her left ventricular ejection fraction was 55-60%.  Overall she is doing well. She notes easy bleeding since she has been taking Plavix. She also bruises easily. She had a rash on her legs is improved and she has mild skin itching. She denies chest pain or pressure, dyspnea, myalgias, edema, or palpitations.  Outpatient Encounter Prescriptions as of 02/01/2013  Medication Sig Dispense Refill  . aspirin EC 81 MG tablet Take 1 tablet (81 mg total) by mouth daily.      . Calcium Carbonate (CALTRATE 600) 1500 MG TABS Take 1 tablet by mouth 2 (two) times daily.        . Cholecalciferol (VITAMIN D) 1000 UNITS capsule Take 1,000 Units by mouth daily.        . clopidogrel (PLAVIX) 75 MG tablet Take 1 tablet (75 mg total) by mouth daily with breakfast.  30 tablet  1  . co-enzyme Q-10 50 MG capsule Take 50 mg by mouth daily.      Marland Kitchen losartan (COZAAR) 50 MG tablet Take 1 tablet (50 mg total) by mouth daily.  90 tablet  3  . Magnesium 250 MG TABS Take 250 mg by mouth 2 (two) times daily.      . nitroGLYCERIN (NITROSTAT) 0.4 MG SL tablet Place 1 tablet (0.4 mg total) under the tongue every 5 (five) minutes as needed for chest pain.  25 tablet  3  . Omega-3 Fatty Acids (FISH OIL) 1000 MG CPDR Take 1 capsule by mouth 2 (two) times daily.        . Probiotic Product (PROBIOTIC ACIDOPHILUS) CAPS Take 1 tablet by mouth daily.        . promethazine (PHENERGAN) 25 MG tablet Take 0.5-1 tablets (12.5-25 mg total) by mouth every 6 (six) hours as needed for nausea.  30 tablet  1  . simvastatin (ZOCOR) 40 MG tablet Take 1 tablet (40 mg total) by mouth at bedtime.  90 tablet  2  . venlafaxine XR (EFFEXOR XR) 37.5 MG 24 hr capsule Take 1 capsule (37.5  mg total) by mouth daily.  90 capsule  3  . zinc gluconate 50 MG tablet Take 50 mg by mouth daily.         No facility-administered encounter medications on file as of 02/01/2013.    Allergies  Allergen Reactions  . Codeine     REACTION: severe nausea    Past Medical History  Diagnosis Date  . Hx of colonic polyp 2006    diminutive '06, no polyps on 4/09 colo - for repeat 4/14  . IBS (irritable bowel syndrome)   . Erosive gastritis 2006    NSAID precipitated, resolved on f/u EGD  . MIGRAINE HEADACHE   . PULMONARY NODULE   . PVD     normal ABI/TBI 09/2009, chronic L toe pain and vasospasm (Raynauds)  . POSTMENOPAUSAL SYNDROME   . RENAL CYST   . Hypertension   . Hyperlipidemia   . DEPRESSION   . Coronary artery disease     a. Cath 11/17/12 - severe Ca++ moderately severe disease in the mid RCA, otherwise nonobstructive disease - s/p PTCA/rotational atherectomy & 2 DES to mid RCA 11/22/12.  . Sinus bradycardia     a. not on BB due to this.  ROS: Negative except as per HPI  BP 118/72  Pulse 56  Ht 5\' 6"  (1.676 m)  Wt 61.961 kg (136 lb 9.6 oz)  BMI 22.06 kg/m2  SpO2 99%  PHYSICAL EXAM: Pt is alert and oriented, NAD HEENT: normal Neck: JVP - normal, carotids 2+= without bruits Lungs: CTA bilaterally CV: RRR without murmur or gallop Abd: soft, NT, Positive BS, no hepatomegaly Ext: no C/C/E, distal pulses intact and equal Skin: warm/dry no rash  ASSESSMENT AND PLAN: 1. Coronary artery disease, native vessel. The patient is doing well. We discussed consideration of changing Plavix to either effient or brilinta. After discussion of pros and cons, she would like to continue Plavix. We will stop it and 12 months from her PCI procedure which will be February 2015. She will remain on aspirin 81 mg.  2. Hyperlipidemia. Patient is on simvastatin. Recommend repeat lipids and LFTs before she returns for followup in 6 months.  3. Hypertension. Blood pressure is well controlled on  losartan.  Tonny Bollman 02/01/2013 10:47 AM

## 2013-02-01 NOTE — Patient Instructions (Addendum)
Your physician wants you to follow-up in: 6 MONTHS with Dr Excell Seltzer.  You will receive a reminder letter in the mail two months in advance. If you don't receive a letter, please call our office to schedule the follow-up appointment.  Your physician recommends that you return for a FASTING LIPID and LIVER profile in 6 MONTHS--nothing to eat or drink after midnight, this blood work should be drawn one week prior to appointment  Your physician has recommended you make the following change in your medication: STOP CoQ10

## 2013-02-16 ENCOUNTER — Telehealth: Payer: Self-pay

## 2013-02-16 MED ORDER — ALPRAZOLAM 0.5 MG PO TABS: 0.5000 mg | ORAL_TABLET | Freq: Three times a day (TID) | ORAL | Status: DC | PRN

## 2013-02-16 NOTE — Telephone Encounter (Signed)
Please share my condolences with her Xanax 3 times a day as needed - rx done

## 2013-02-16 NOTE — Telephone Encounter (Signed)
Condolences offered to pt on behalf of [facility] and staff

## 2013-02-16 NOTE — Telephone Encounter (Signed)
Pt advised of Rx/pharmacy 

## 2013-02-16 NOTE — Telephone Encounter (Signed)
Pt called stating her 75 year old grand daughter recently pasted away. Pt is requesting Rx to help with stress and anxiety of her death. Pt will be traveling to [facility] tomorrow and will be unable to make OV, please advise

## 2013-02-23 ENCOUNTER — Telehealth: Payer: Self-pay | Admitting: *Deleted

## 2013-02-23 NOTE — Telephone Encounter (Signed)
We do not refer to psychologist or grief counselors. They prefer if the patient calls to make the appointment herself. She can look one up on the Internet or the phone book.I can give her a phone number of one in the are. [facility], Dr. Raquel James, [phone]. Let her know that we are sorry to hear about her loss. Michele Burns

## 2013-02-23 NOTE — Telephone Encounter (Signed)
Pt informed of NP's advisement and name of Dr. Debarah Crape office at [facility] and contact number.

## 2013-02-23 NOTE — Telephone Encounter (Signed)
Pt is requesting referral or recommendation from MD for a grief counselor or psychologist to see after the recent passing of her 75 year old granddaughter.

## 2013-03-16 ENCOUNTER — Telehealth: Payer: Self-pay | Admitting: *Deleted

## 2013-03-16 MED ORDER — ESCITALOPRAM OXALATE 10 MG PO TABS: 10.0000 mg | ORAL_TABLET | Freq: Every day | ORAL | Status: DC

## 2013-03-16 NOTE — Telephone Encounter (Signed)
Is patient still taking effexor (venlafexine)? If so, stop effexor and begin lexapro - erx done OV in 6 weeks to review, sooner if worse thanks

## 2013-03-16 NOTE — Telephone Encounter (Signed)
Pt return call back gave md response. Pt states she stop taking the effexor wasn't doing her any good. Inform pt sent lexapro to pharmacy. Transferred to schedulers to make appt...Raechel Chute

## 2013-03-16 NOTE — Telephone Encounter (Signed)
Pt states md rx xanax to her when her grand daughter past. Not handling it very well. Not able to take the xanax during the day while she works. Her daughter is taking lexapro. Wanting to see what md recommend & can rx. Was told celexa may help her as well...Raechel Chute

## 2013-03-16 NOTE — Telephone Encounter (Signed)
Called pt no answer LMOM RTC.../lmb 

## 2013-03-20 ENCOUNTER — Telehealth: Payer: Self-pay | Admitting: Cardiovascular Disease

## 2013-03-20 NOTE — Telephone Encounter (Signed)
Left message on machine for pt to contact the office.   

## 2013-03-20 NOTE — Telephone Encounter (Signed)
New Prob     Pt is requesting a anti anxiety prescription bc she is in psychiatric therapy and on LEXAPRO. Please call.

## 2013-03-20 NOTE — Telephone Encounter (Signed)
The pt is taking Lexapro due to family issues. The pt also started therapy this morning due to death in family. The pt was given Xanax by Dr Felicity Coyer and she actually forgot that this medication had been prescribed until I mentioned the medication.  The pt looked in her medicine cabinet and she does have a prescription.  The pt will try taking this medication to help with her anxiety.  The pt said she thinks that she did take the medication but had to stop due to being sleepy at work.  I made the pt aware that she can cut the tablet in half if needed. The pt was very thankful for my return call.

## 2013-03-20 NOTE — Telephone Encounter (Signed)
Follow up  ° ° ° ° °Pt is returning your call  °

## 2013-03-30 ENCOUNTER — Other Ambulatory Visit: Payer: Self-pay | Admitting: Internal Medicine

## 2013-04-04 ENCOUNTER — Telehealth: Payer: Self-pay | Admitting: *Deleted

## 2013-04-04 NOTE — Telephone Encounter (Signed)
Pt called requesting Refill on Xanax 0.5mg 

## 2013-04-04 NOTE — Telephone Encounter (Signed)
Called pt LMOM [facility]/31/13...lmb

## 2013-04-04 NOTE — Telephone Encounter (Signed)
Just done 6/20 by Rene Kocher

## 2013-04-07 ENCOUNTER — Encounter: Payer: Self-pay | Admitting: Internal Medicine

## 2013-04-25 ENCOUNTER — Encounter: Payer: Self-pay | Admitting: Internal Medicine

## 2013-04-25 ENCOUNTER — Ambulatory Visit (INDEPENDENT_AMBULATORY_CARE_PROVIDER_SITE_OTHER): Payer: Medicare Other | Admitting: Internal Medicine

## 2013-04-25 VITALS — BP 112/70 | HR 68 | Temp 97.5°F | Wt 135.0 lb

## 2013-04-25 DIAGNOSIS — F3289 Other specified depressive episodes: Secondary | ICD-10-CM

## 2013-04-25 DIAGNOSIS — F4321 Adjustment disorder with depressed mood: Secondary | ICD-10-CM

## 2013-04-25 DIAGNOSIS — F329 Major depressive disorder, single episode, unspecified: Secondary | ICD-10-CM

## 2013-04-25 MED ORDER — ESCITALOPRAM OXALATE 20 MG PO TABS: 20.0000 mg | ORAL_TABLET | Freq: Every day | ORAL | Status: DC

## 2013-04-25 NOTE — Assessment & Plan Note (Signed)
Exacerbated by grief 02/2013 at loss of 75yo g-dtr - see phone notes re: same Complicating chronic symptoms associated with chronic insomnia Change to Celexa to Effexor August 2013, then to lexapro 03/2013 Increase dose now Refer to new psychologist - [facility] follow up 4-6 weeks Verified no SI/HI

## 2013-04-25 NOTE — Patient Instructions (Signed)
It was good to see you today. We have reviewed your prior records including labs and tests today we'll make referral to Dr. Dellia Cloud for counseling. Our office will contact you regarding appointment(s) once made. Medications reviewed and updated, increase Lexapro to 20 mg daily Your prescription(s) have been submitted to your pharmacy. Please take as directed and contact our office if you believe you are having problem(s) with the medication(s). Continue Xanax as needed, okay to stop supplements Followup in 4-6 weeks to continue medication review, call sooner if problems

## 2013-04-25 NOTE — Progress Notes (Signed)
  Subjective:    Patient ID: Michele Burns, female    DOB: 1936/04/22, 77 y.o.   MRN: [id number]  HPI  Here for follow up -  Past Medical History  Diagnosis Date  . Hx of colonic polyp 2006    diminutive '06, no polyps on 4/09 colo - for repeat 4/14  . IBS (irritable bowel syndrome)   . Erosive gastritis 2006    NSAID precipitated, resolved on f/u EGD  . MIGRAINE HEADACHE   . PULMONARY NODULE   . PVD     normal ABI/TBI 09/2009, chronic L toe pain and vasospasm (Raynauds)  . POSTMENOPAUSAL SYNDROME   . RENAL CYST   . Hypertension   . Hyperlipidemia   . DEPRESSION   . Coronary artery disease     a. Cath 11/17/12 - severe Ca++ moderately severe disease in the mid RCA, otherwise nonobstructive disease - s/p PTCA/rotational atherectomy & 2 DES to mid RCA 11/22/12.  . Sinus bradycardia     a. not on BB due to this.    Review of Systems  Constitutional: Positive for fatigue. Negative for unexpected weight change.  Respiratory: Negative for cough and shortness of breath.   Cardiovascular: Negative for chest pain and palpitations.  Psychiatric/Behavioral: Positive for sleep disturbance, dysphoric mood and decreased concentration. Negative for suicidal ideas, hallucinations, behavioral problems, self-injury and agitation. The patient is nervous/anxious.        Objective:   Physical Exam  BP 112/70  Pulse 68  Temp(Src) 97.5 F (36.4 C) (Oral)  Wt 135 lb (61.236 kg)  BMI 21.8 kg/m2  SpO2 97% Wt Readings from Last 3 Encounters:  04/25/13 135 lb (61.236 kg)  02/01/13 136 lb 9.6 oz (61.961 kg)  12/23/12 135 lb 12.8 oz (61.598 kg)   Constitutional: She appears well-developed and well-nourished. No distress. sad, tears in eyes Neck: Normal range of motion. Neck supple. No JVD or LAD present. No thyromegaly present.  Cardiovascular: Normal rate, regular rhythm and normal heart sounds.  No murmur heard. No BLE edema. Pulmonary/Chest: Effort normal and breath sounds normal. No  respiratory distress. She has no wheezes.  Psychiatric: She has an appropriately dysphoric mood and affect. behavior is normal. Judgment and thought content normal.   Lab Results  Component Value Date   WBC 6.5 11/23/2012   HGB 12.1 11/23/2012   HCT 35.4* 11/23/2012   PLT 193 11/23/2012   GLUCOSE 96 11/23/2012   CHOL 186 05/24/2012   TRIG 45.0 05/24/2012   HDL 98.50 05/24/2012   LDLCALC 79 05/24/2012   ALT 33 05/24/2012   AST 41* 05/24/2012   NA 140 11/23/2012   K 4.4 11/23/2012   CL 106 11/23/2012   CREATININE 0.86 11/23/2012   BUN 16 11/23/2012   CO2 27 11/23/2012   TSH 2.86 05/24/2012   INR 0.95 11/17/2012      Assessment & Plan:   See problem list. Medications and labs reviewed today.

## 2013-05-03 ENCOUNTER — Other Ambulatory Visit: Payer: Self-pay | Admitting: Internal Medicine

## 2013-05-06 ENCOUNTER — Other Ambulatory Visit: Payer: Self-pay | Admitting: Internal Medicine

## 2013-05-08 ENCOUNTER — Other Ambulatory Visit: Payer: Self-pay | Admitting: Internal Medicine

## 2013-05-09 ENCOUNTER — Telehealth: Payer: Self-pay | Admitting: Internal Medicine

## 2013-05-09 MED ORDER — ALPRAZOLAM 0.5 MG PO TABS: 0.5000 mg | ORAL_TABLET | Freq: Three times a day (TID) | ORAL | Status: DC | PRN

## 2013-05-09 NOTE — Telephone Encounter (Signed)
Called pt no answer LMOM med has been called into pharmacy. [facility] spoke with Dorene Grebe gave md approval...lmb

## 2013-05-09 NOTE — Telephone Encounter (Signed)
ok 

## 2013-05-09 NOTE — Telephone Encounter (Signed)
Pt called stated that she needs  refill for Xanax .5 mg. Pt stated that drug store request couple times and their request was denied. Pt does not have more med left. Please advise.

## 2013-05-15 ENCOUNTER — Other Ambulatory Visit: Payer: Self-pay | Admitting: *Deleted

## 2013-05-15 MED ORDER — LOSARTAN POTASSIUM 50 MG PO TABS: 50.0000 mg | ORAL_TABLET | Freq: Every day | ORAL | Status: DC

## 2013-06-06 ENCOUNTER — Telehealth: Payer: Self-pay | Admitting: Internal Medicine

## 2013-06-06 NOTE — Telephone Encounter (Signed)
She can wait until 11/2013 to consider colonoscopy since she had a DES and atherectomy 11/2012 and is on clopidogrel.  Patient informed.

## 2013-06-06 NOTE — Telephone Encounter (Signed)
Recall changed in Epic and appointments cancelled.

## 2013-06-07 ENCOUNTER — Ambulatory Visit: Payer: Medicare Other | Admitting: Nurse Practitioner

## 2013-06-21 ENCOUNTER — Encounter: Payer: Medicare Other | Admitting: Internal Medicine

## 2013-06-30 ENCOUNTER — Other Ambulatory Visit: Payer: Self-pay | Admitting: Internal Medicine

## 2013-07-03 NOTE — Telephone Encounter (Signed)
[facility].../lmb 

## 2013-07-06 ENCOUNTER — Other Ambulatory Visit (INDEPENDENT_AMBULATORY_CARE_PROVIDER_SITE_OTHER): Payer: Medicare Other

## 2013-07-06 ENCOUNTER — Ambulatory Visit (INDEPENDENT_AMBULATORY_CARE_PROVIDER_SITE_OTHER): Payer: Medicare Other | Admitting: Internal Medicine

## 2013-07-06 ENCOUNTER — Encounter: Payer: Self-pay | Admitting: Internal Medicine

## 2013-07-06 VITALS — BP 128/70 | HR 55 | Temp 97.6°F | Wt 133.2 lb

## 2013-07-06 DIAGNOSIS — R251 Tremor, unspecified: Secondary | ICD-10-CM

## 2013-07-06 DIAGNOSIS — I2510 Atherosclerotic heart disease of native coronary artery without angina pectoris: Secondary | ICD-10-CM

## 2013-07-06 DIAGNOSIS — Z23 Encounter for immunization: Secondary | ICD-10-CM

## 2013-07-06 DIAGNOSIS — F3289 Other specified depressive episodes: Secondary | ICD-10-CM

## 2013-07-06 DIAGNOSIS — R259 Unspecified abnormal involuntary movements: Secondary | ICD-10-CM

## 2013-07-06 DIAGNOSIS — E785 Hyperlipidemia, unspecified: Secondary | ICD-10-CM

## 2013-07-06 DIAGNOSIS — F329 Major depressive disorder, single episode, unspecified: Secondary | ICD-10-CM

## 2013-07-06 LAB — CBC WITH DIFFERENTIAL/PLATELET
Basophils Relative: 0.8 % (ref 0.0–3.0)
Eosinophils Relative: 4.6 % (ref 0.0–5.0)
HCT: 37.7 % (ref 36.0–46.0)
Hemoglobin: 12.8 g/dL (ref 12.0–15.0)
Lymphs Abs: 0.9 10*3/uL (ref 0.7–4.0)
MCV: 93.1 fl (ref 78.0–100.0)
Monocytes Absolute: 0.4 10*3/uL (ref 0.1–1.0)
Monocytes Relative: 11.4 % (ref 3.0–12.0)
RBC: 4.05 Mil/uL (ref 3.87–5.11)
WBC: 3.6 10*3/uL — ABNORMAL LOW (ref 4.5–10.5)

## 2013-07-06 LAB — BASIC METABOLIC PANEL
BUN: 23 mg/dL (ref 6–23)
CO2: 30 mEq/L (ref 19–32)
Calcium: 9.3 mg/dL (ref 8.4–10.5)
GFR: 64.01 mL/min (ref >60.00)
Glucose, Bld: 78 mg/dL (ref 70–99)

## 2013-07-06 LAB — HEPATIC FUNCTION PANEL
Albumin: 4.1 g/dL (ref 3.5–5.2)
Total Bilirubin: 1.1 mg/dL (ref 0.3–1.2)

## 2013-07-06 LAB — LIPID PANEL
HDL: 93.7 mg/dL (ref >39.00)
LDL Cholesterol: 77 mg/dL (ref 0–99)
Total CHOL/HDL Ratio: 2
VLDL: 5.2 mg/dL (ref 0.0–40.0)

## 2013-07-06 NOTE — Assessment & Plan Note (Signed)
Abnormal stress test prompting cardiac cath February 2014 PTCA to RCA November 22, 2012 reviewed Medical management ongoing, new addition of Plavix 11/2012, holding beta blocker because of sinus bradycardia Following with cardiology for same, and no recurrent symptoms Lipids at goal, continue statin

## 2013-07-06 NOTE — Progress Notes (Signed)
Subjective:    Patient ID: Michele Burns, female    DOB: 12-07-1935, 77 y.o.   MRN: [id number]  HPI Here for follow up - reviewed chronic medical issues and interval medical events  Concerned with hand tremor, bilateral symptoms - ongoing 3-4 months  Past Medical History  Diagnosis Date  . Hx of colonic polyp 2006    diminutive '06, no polyps on 4/09 colo - for repeat 4/14  . IBS (irritable bowel syndrome)   . Erosive gastritis 2006    NSAID precipitated, resolved on f/u EGD  . MIGRAINE HEADACHE   . PULMONARY NODULE   . PVD     normal ABI/TBI 09/2009, chronic L toe pain and vasospasm (Raynauds)  . POSTMENOPAUSAL SYNDROME   . RENAL CYST   . Hypertension   . Hyperlipidemia   . DEPRESSION   . Coronary artery disease     a. Cath 11/17/12 - severe Ca++ moderately severe disease in the mid RCA, otherwise nonobstructive disease - s/p PTCA/rotational atherectomy & 2 DES to mid RCA 11/22/12.  . Sinus bradycardia     a. not on BB due to this.    Review of Systems  Constitutional: Positive for fatigue. Negative for fever and unexpected weight change.  Respiratory: Negative for cough and shortness of breath.   Cardiovascular: Negative for chest pain and palpitations.  Gastrointestinal: Positive for diarrhea (occ). Negative for nausea, vomiting, abdominal pain and constipation.  Musculoskeletal: Negative for gait problem.  Skin: Negative for rash and wound.  Neurological: Positive for tremors. Negative for dizziness, seizures, facial asymmetry, speech difficulty, weakness, light-headedness and numbness.  Psychiatric/Behavioral: Positive for sleep disturbance. Negative for suicidal ideas, hallucinations, behavioral problems, self-injury, dysphoric mood, decreased concentration and agitation. The patient is not nervous/anxious.        Objective:   Physical Exam BP 128/70  Pulse 55  Temp(Src) 97.6 F (36.4 C) (Oral)  Wt 133 lb 3.2 oz (60.419 kg)  BMI 21.51 kg/m2  SpO2 98% Wt  Readings from Last 3 Encounters:  07/06/13 133 lb 3.2 oz (60.419 kg)  04/25/13 135 lb (61.236 kg)  02/01/13 136 lb 9.6 oz (61.961 kg)   Constitutional: She appears well-developed and well-nourished. No distress.  Neck: Normal range of motion. Neck supple. No JVD or LAD present. No thyromegaly present.  Cardiovascular: Normal rate, regular rhythm and normal heart sounds.  No murmur heard. No BLE edema. Pulmonary/Chest: Effort normal and breath sounds normal. No respiratory distress. She has no wheezes.  Neurologic: awake, alert, oriented x4. No resting or intention tremor of bilateral hands or outstretched hands. 5 out of 5 strength in bilateral upper ext and good symmetric hand strength. No voice tremor, head or neck tremor. Balance normal with negative Romberg. Gait normal and unaided Psychiatric: She has a min dysphoric mood and affect. behavior is normal. Judgment and thought content normal.    Lab Results  Component Value Date   WBC 6.5 11/23/2012   HGB 12.1 11/23/2012   HCT 35.4* 11/23/2012   PLT 193 11/23/2012   GLUCOSE 96 11/23/2012   CHOL 186 05/24/2012   TRIG 45.0 05/24/2012   HDL 98.50 05/24/2012   LDLCALC 79 05/24/2012   ALT 33 05/24/2012   AST 41* 05/24/2012   NA 140 11/23/2012   K 4.4 11/23/2012   CL 106 11/23/2012   CREATININE 0.86 11/23/2012   BUN 16 11/23/2012   CO2 27 11/23/2012   TSH 2.86 05/24/2012   INR 0.95 11/17/2012      Assessment &  Plan:   See problem list. Medications and labs reviewed today.  Tremor bilateral hands and handwriting changes in past 3-4 months - reports now improving in past few weeks. Denies relationship with "anxiety" symptoms, but acknowledges improvement in symptoms since increasing Lexapro dose 6 weeks ago. No other neuro concerns on history or exam, the patient will call if symptoms worse or other new associated symptoms develop between now and next visit. Also check screening labs to include thyroid

## 2013-07-06 NOTE — Patient Instructions (Signed)
It was good to see you today. Your annual flu shot was given and/or updated today. We have reviewed your prior records including labs and tests today Test(s) ordered today. Your results will be released to MyChart (or called to you) after review, usually within 72hours after test completion. If any changes need to be made, you will be notified at that same time. Medications reviewed and updated, no changes recommended at this time. Please schedule followup in 3-6 months, call sooner if problems.

## 2013-07-06 NOTE — Assessment & Plan Note (Signed)
On statin - Check annually, goal LDL<70 with CAD - see next

## 2013-07-06 NOTE — Assessment & Plan Note (Signed)
Exacerbated by grief 02/2013 at loss of 75yo g-dtr - see phone notes re: same Complicating chronic symptoms associated with chronic insomnia Change to Celexa to Effexor August 2013, then to lexapro 03/2013 Increase dose 7/20-14 - improved Declined eval with psychologist - [facility]man because of $ no SI/HI

## 2013-07-07 ENCOUNTER — Other Ambulatory Visit: Payer: Medicare Other

## 2013-07-11 ENCOUNTER — Other Ambulatory Visit: Payer: Medicare Other

## 2013-08-01 ENCOUNTER — Telehealth: Payer: Self-pay | Admitting: *Deleted

## 2013-08-01 MED ORDER — SIMVASTATIN 40 MG PO TABS: 40.0000 mg | ORAL_TABLET | Freq: Every day | ORAL | Status: DC

## 2013-08-01 MED ORDER — ALPRAZOLAM 0.5 MG PO TABS: 0.5000 mg | ORAL_TABLET | Freq: Three times a day (TID) | ORAL | Status: DC | PRN

## 2013-08-01 NOTE — Telephone Encounter (Signed)
Pt requesting Xanax refill.  Please advise

## 2013-08-01 NOTE — Telephone Encounter (Signed)
Left message on VM Rx ready for pick up 

## 2013-08-01 NOTE — Telephone Encounter (Signed)
ok 

## 2013-08-09 ENCOUNTER — Encounter: Payer: Self-pay | Admitting: Cardiovascular Disease

## 2013-08-09 ENCOUNTER — Ambulatory Visit (INDEPENDENT_AMBULATORY_CARE_PROVIDER_SITE_OTHER): Payer: Medicare Other | Admitting: Cardiovascular Disease

## 2013-08-09 VITALS — BP 124/74 | HR 54 | Ht 66.0 in | Wt 131.0 lb

## 2013-08-09 DIAGNOSIS — I2510 Atherosclerotic heart disease of native coronary artery without angina pectoris: Secondary | ICD-10-CM

## 2013-08-09 DIAGNOSIS — E785 Hyperlipidemia, unspecified: Secondary | ICD-10-CM

## 2013-08-09 NOTE — Patient Instructions (Signed)
Your physician recommends that you continue on your current medications as directed. Please refer to the Current Medication list given to you today.  Your physician wants you to follow-up in: 6 MONTHS with Dr Excell Seltzer.  You will receive a reminder letter in the mail two months in advance. If you don't receive a letter, please call our office to schedule the follow-up appointment.  YOU CAN STOP PLAVIX 11/12/13

## 2013-08-09 NOTE — Progress Notes (Signed)
HPI:  77 year old woman presenting for followup evaluation. She underwent PCI of the right coronary artery in February 2014 after presenting with symptoms of unstable angina. She was treated with overlapping drug-eluting stents and adjunctive rotational atherectomy of the RCA. Her left ventricular ejection fraction was 55-60%. Labs from 07/06/2013 demonstrated normal liver function tests, a total cholesterol of 176, triglycerides 26, HDL 94, and LDL 77.  The patient is doing well. She denies chest pain, chest pressure, shortness of breath, leg swelling, or palpitations. She's had a resting hand tremor. The tremor resolves with activity. She exercises without exertional symptoms. She complains of easy bruising on Plavix.  Outpatient Encounter Prescriptions as of 08/09/2013  Medication Sig Dispense Refill  . ALPRAZolam (XANAX) 0.5 MG tablet Take 1 tablet (0.5 mg total) by mouth 3 (three) times daily as needed for sleep or anxiety.  60 tablet  1  . aspirin EC 81 MG tablet Take 1 tablet (81 mg total) by mouth daily.      . Calcium Carbonate (CALTRATE 600) 1500 MG TABS Take 1 tablet by mouth 2 (two) times daily.        . Cholecalciferol (VITAMIN D) 1000 UNITS capsule Take 1,000 Units by mouth daily.        . clopidogrel (PLAVIX) 75 MG tablet Take 1 tablet (75 mg total) by mouth daily with breakfast.  30 tablet  1  . escitalopram (LEXAPRO) 20 MG tablet Take 1 tablet (20 mg total) by mouth daily.  30 tablet  3  . losartan (COZAAR) 50 MG tablet Take 1 tablet (50 mg total) by mouth daily.  90 tablet  3  . Magnesium 250 MG TABS Take 250 mg by mouth 2 (two) times daily.      . nitroGLYCERIN (NITROSTAT) 0.4 MG SL tablet Place 1 tablet (0.4 mg total) under the tongue every 5 (five) minutes as needed for chest pain.  25 tablet  3  . Omega-3 Fatty Acids (FISH OIL) 1000 MG CPDR Take 1 capsule by mouth 2 (two) times daily.        . Probiotic Product (PROBIOTIC ACIDOPHILUS) CAPS Take 1 tablet by mouth daily.         . promethazine (PHENERGAN) 25 MG tablet Take 0.5-1 tablets (12.5-25 mg total) by mouth every 6 (six) hours as needed for nausea.  30 tablet  1  . simvastatin (ZOCOR) 40 MG tablet Take 1 tablet (40 mg total) by mouth at bedtime.  90 tablet  2  . zinc gluconate 50 MG tablet Take 50 mg by mouth daily.        . [DISCONTINUED] cephALEXin (KEFLEX) 500 MG capsule        No facility-administered encounter medications on file as of 08/09/2013.    Allergies  Allergen Reactions  . Codeine     REACTION: severe nausea    Past Medical History  Diagnosis Date  . Hx of colonic polyp 2006    diminutive '06, no polyps on 4/09 colo - for repeat 4/14  . IBS (irritable bowel syndrome)   . Erosive gastritis 2006    NSAID precipitated, resolved on f/u EGD  . MIGRAINE HEADACHE   . PULMONARY NODULE   . PVD     normal ABI/TBI 09/2009, chronic L toe pain and vasospasm (Raynauds)  . POSTMENOPAUSAL SYNDROME   . RENAL CYST   . Hypertension   . Hyperlipidemia   . DEPRESSION   . Coronary artery disease     a. Cath 11/17/12 -  severe Ca++ moderately severe disease in the mid RCA, otherwise nonobstructive disease - s/p PTCA/rotational atherectomy & 2 DES to mid RCA 11/22/12.  . Sinus bradycardia     a. not on BB due to this.   ROS: Negative except as per HPI  BP 124/74  Pulse 54  Ht 5\' 6"  (1.676 m)  Wt 131 lb (59.421 kg)  BMI 21.15 kg/m2  PHYSICAL EXAM: Pt is alert and oriented, NAD HEENT: normal Neck: JVP - normal, carotids 2+= without bruits Lungs: CTA bilaterally CV: RRR without murmur or gallop Abd: soft, NT, Positive BS, no hepatomegaly Ext: no C/C/E, distal pulses intact and equal Skin: warm/dry no rash  EKG:  Sinus bradycardia 54 beats per minute, left axis deviation, cannot rule out age-indeterminate septal infarct.  ASSESSMENT AND PLAN: 1. Coronary artery disease, native vessel. The patient is status post rotational atherectomy and stenting of the right coronary artery in February  2014. She continues on dual antiplatelet therapy with aspirin and Plavix. As per guideline recommendation she can stop Plavix in February 2015. She otherwise will continue on her current medical program and I will plan on seeing her back in 6 months for followup.  2. Hypertension. Blood pressure is controlled on losartan. Continue the same.  3. Hyperlipidemia. She history and was simvastatin 40 mg and lipids are excellent as outlined above.  For followup I will see her back in 6 months.  Tonny Bollman 08/09/2013 4:42 PM

## 2013-08-20 ENCOUNTER — Other Ambulatory Visit: Payer: Self-pay | Admitting: Internal Medicine

## 2013-09-15 ENCOUNTER — Ambulatory Visit: Payer: Medicare Other | Admitting: Cardiovascular Disease

## 2013-09-22 ENCOUNTER — Other Ambulatory Visit: Payer: Self-pay | Admitting: *Deleted

## 2013-09-22 MED ORDER — ESCITALOPRAM OXALATE 20 MG PO TABS: ORAL_TABLET | ORAL | Status: DC

## 2013-09-25 ENCOUNTER — Other Ambulatory Visit: Payer: Self-pay | Admitting: *Deleted

## 2013-09-25 NOTE — Telephone Encounter (Signed)
A user error has taken place.

## 2013-10-22 ENCOUNTER — Other Ambulatory Visit: Payer: Self-pay | Admitting: Internal Medicine

## 2013-10-23 ENCOUNTER — Other Ambulatory Visit: Payer: Self-pay | Admitting: Internal Medicine

## 2013-10-23 LAB — HM MAMMOGRAPHY

## 2013-10-25 ENCOUNTER — Encounter: Payer: Self-pay | Admitting: Internal Medicine

## 2013-11-20 ENCOUNTER — Telehealth: Payer: Self-pay | Admitting: Cardiovascular Disease

## 2013-11-20 NOTE — Telephone Encounter (Signed)
Spoke with patient who states she is concerned about stopping Plavix.  Patient was instructed at office visit on 08/09/13 with Dr. Burt Knack to stop Plavix on 11/12/13.  Patient states she has continued to take it because she is concerned about having a heart attack when she stops.  I advised patient that Dr. Burt Knack advises patients to continue dual therapy of Plavix and ASA for 1 year post stent.  Patient would like further reassurance that it is okay to stop the Plavix. I advised patient that I will discuss with Dr. Burt Knack and will call her back.  Patient verbalized understanding and agreement.

## 2013-11-20 NOTE — Telephone Encounter (Signed)
New message   Patient calling with a questions. On plavix was told to come off . Patient is concerned about giving it up.

## 2013-11-21 NOTE — Telephone Encounter (Signed)
Follow up    Patient calling stating she returning a call from Dr. Burt Knack from yesterday.

## 2013-11-21 NOTE — Telephone Encounter (Signed)
Called and left message with patient. She will call back Thursday when I'm back in the office.

## 2013-11-23 ENCOUNTER — Other Ambulatory Visit: Payer: Self-pay | Admitting: Internal Medicine

## 2013-11-23 ENCOUNTER — Other Ambulatory Visit: Payer: Self-pay

## 2013-11-23 MED ORDER — CLOPIDOGREL BISULFATE 75 MG PO TABS: 75.0000 mg | ORAL_TABLET | Freq: Every day | ORAL | Status: DC

## 2013-11-23 NOTE — Telephone Encounter (Signed)
[facility].../lmb 

## 2013-11-23 NOTE — Telephone Encounter (Signed)
Called her back and left message again.

## 2013-11-29 NOTE — Telephone Encounter (Signed)
Left voicemail again. Third attempt to call. Advised to call back if she would like to discuss further. Advised Friday would be a good day to reach me since I'll be in the office all day. thx

## 2013-11-30 ENCOUNTER — Encounter: Payer: Self-pay | Admitting: Internal Medicine

## 2013-12-04 NOTE — Telephone Encounter (Signed)
Spoke with patient. She will remain on plavix until her FOV here.

## 2013-12-20 ENCOUNTER — Ambulatory Visit: Payer: Self-pay | Admitting: Podiatry

## 2014-01-01 ENCOUNTER — Other Ambulatory Visit: Payer: Self-pay | Admitting: *Deleted

## 2014-01-01 ENCOUNTER — Telehealth: Payer: Self-pay | Admitting: Internal Medicine

## 2014-01-01 DIAGNOSIS — L97909 Non-pressure chronic ulcer of unspecified part of unspecified lower leg with unspecified severity: Secondary | ICD-10-CM

## 2014-01-01 NOTE — Telephone Encounter (Signed)
I think Dr Donnetta Hutching is a very good doctor - I would welcome his input into her symptoms and support her being evaluated by him a suggested by Dr Doran Durand thanks

## 2014-01-01 NOTE — Telephone Encounter (Signed)
Notified pt with md response.../lmb 

## 2014-01-01 NOTE — Telephone Encounter (Signed)
Pt called stated that Dr. Doran Durand refer her to Dr. Donnetta Hutching, vascular surgery. Pt was wondering what Dr. Asa Lente think about this? Pt would like Dr. Asa Lente to know and involve in this too. Pt request phone call from Dr. Asa Lente Assistant.

## 2014-01-02 ENCOUNTER — Ambulatory Visit (INDEPENDENT_AMBULATORY_CARE_PROVIDER_SITE_OTHER): Payer: Medicare Other | Admitting: Vascular Surgery

## 2014-01-02 ENCOUNTER — Encounter (INDEPENDENT_AMBULATORY_CARE_PROVIDER_SITE_OTHER): Payer: Self-pay

## 2014-01-02 ENCOUNTER — Encounter: Payer: Self-pay | Admitting: Vascular Surgery

## 2014-01-02 ENCOUNTER — Ambulatory Visit (HOSPITAL_COMMUNITY)
Admission: RE | Admit: 2014-01-02 | Discharge: 2014-01-02 | Disposition: A | Discharge status: Home / Self Care | Payer: Medicare Other | Source: Ambulatory Visit | Attending: Vascular Surgery | Admitting: Vascular Surgery

## 2014-01-02 VITALS — BP 116/64 | HR 61 | Temp 98.5°F | Ht 66.0 in | Wt 124.4 lb

## 2014-01-02 DIAGNOSIS — L98499 Non-pressure chronic ulcer of skin of other sites with unspecified severity: Principal | ICD-10-CM

## 2014-01-02 DIAGNOSIS — I739 Peripheral vascular disease, unspecified: Secondary | ICD-10-CM

## 2014-01-02 DIAGNOSIS — I7025 Atherosclerosis of native arteries of other extremities with ulceration: Secondary | ICD-10-CM | POA: Insufficient documentation

## 2014-01-02 DIAGNOSIS — L97909 Non-pressure chronic ulcer of unspecified part of unspecified lower leg with unspecified severity: Secondary | ICD-10-CM

## 2014-01-02 NOTE — Progress Notes (Signed)
Patient name: Michele Burns MRN: [id number] DOB: Jun 02, 1936 Sex: female   Referred by: Doran Durand  Reason for referral:  Chief Complaint  Patient presents with  . New Evaluation    L 3rd and 4th toe ulcer     HISTORY OF PRESENT ILLNESS: Patient has today for evaluation of her left foot. She is very unusual history. She reports that in several years ago she developed ulceration over the dorsum of her fourth toe. This eventually healed and she reports that each winter she has pain associated with her fourth toe. She has a callus formation under the tip of her fourth toe and also has a callus between the third and fourth toe near the tip. She reports a severe pain associated with this. She does not have any pain associated with arterial rest pain or claudication type symptoms. No other tissue loss. Does have Raynaud's type symptoms in her hands occasionally but no pain associated with this.  Past Medical History  Diagnosis Date  . Hx of colonic polyp 2006    diminutive '06, no polyps on 4/09 colo - for repeat 4/14  . IBS (irritable bowel syndrome)   . Erosive gastritis 2006    NSAID precipitated, resolved on f/u EGD  . MIGRAINE HEADACHE   . PULMONARY NODULE   . PVD     normal ABI/TBI 09/2009, chronic L toe pain and vasospasm (Raynauds)  . POSTMENOPAUSAL SYNDROME   . RENAL CYST   . Hypertension   . Hyperlipidemia   . DEPRESSION   . Coronary artery disease     a. Cath 11/17/12 - severe Ca++ moderately severe disease in the mid RCA, otherwise nonobstructive disease - s/p PTCA/rotational atherectomy & 2 DES to mid RCA 11/22/12.  . Sinus bradycardia     a. not on BB due to this.    Past Surgical History  Procedure Laterality Date  . Tonsillectomy      As a child  . Vaginal hysterectomy    . Excision morton's neuroma  1971    L 3/4 toe space  . Artherectomy  11/22/2012    RCA  . Abdominal hysterectomy    . Tonsillectomy    . Cardiac valve surgery      History   Social  History  . Marital Status: Widowed    Spouse Name: N/A    Number of Children: N/A  . Years of Education: N/A   Occupational History  . desk [employer]    Social History Main Topics  . Smoking status: Former Smoker -- 30 years    Types: Cigarettes    Quit date: 10/12/1980  . Smokeless tobacco: Never Used     Comment: She is widowed since 2002-she has 3 grown children and 5 g-kids. Moved to [place] from [place] in 2010 to be close to family  . Alcohol Use: 6.0 oz/week    10 Glasses of wine per week     Comment: 1 per day  . Drug Use: No  . Sexual Activity: Not on file   Other Topics Concern  . Not on file   Social History Narrative  . No narrative on file    Family History  Problem Relation Age of Onset  . Heart attack Father 14    deceased  . Heart attack Mother 54    deceased  . Diabetes type I Mother   . Diabetes Mother   . Heart attack Brother 80    deceased  .  Diabetes Paternal Grandmother     Allergies as of 01/02/2014 - Review Complete 01/02/2014  Allergen Reaction Noted  . Codeine  09/20/2009    Current Outpatient Prescriptions on File Prior to Visit  Medication Sig Dispense Refill  . clopidogrel (PLAVIX) 75 MG tablet Take 1 tablet (75 mg total) by mouth daily with breakfast.  90 tablet  1  . escitalopram (LEXAPRO) 10 MG tablet TAKE 1 TABLET BY MOUTH DAILY.  30 tablet  5  . losartan (COZAAR) 50 MG tablet Take 1 tablet (50 mg total) by mouth daily.  90 tablet  3  . Magnesium 250 MG TABS Take 250 mg by mouth daily.       . Omega-3 Fatty Acids (FISH OIL) 1000 MG CPDR Take 1 capsule by mouth 2 (two) times daily.        . Probiotic Product (PROBIOTIC ACIDOPHILUS) CAPS Take 1 tablet by mouth daily.        . simvastatin (ZOCOR) 40 MG tablet Take 1 tablet (40 mg total) by mouth at bedtime.  90 tablet  2  . zinc gluconate 50 MG tablet Take 50 mg by mouth daily.        . Calcium Carbonate (CALTRATE 600) 1500 MG TABS Take 1 tablet by mouth 2 (two) times daily.         . Cholecalciferol (VITAMIN D) 1000 UNITS capsule Take 1,000 Units by mouth daily.        Marland Kitchen escitalopram (LEXAPRO) 10 MG tablet TAKE 1 TABLET BY MOUTH DAILY.  30 tablet  5  . escitalopram (LEXAPRO) 20 MG tablet TAKE 1 TABLET (20 MG TOTAL) BY MOUTH DAILY.  90 tablet  1  . nitroGLYCERIN (NITROSTAT) 0.4 MG SL tablet Place 1 tablet (0.4 mg total) under the tongue every 5 (five) minutes as needed for chest pain.  25 tablet  3  . promethazine (PHENERGAN) 25 MG tablet Take 0.5-1 tablets (12.5-25 mg total) by mouth every 6 (six) hours as needed for nausea.  30 tablet  1   No current facility-administered medications on file prior to visit.     REVIEW OF SYSTEMS:  Positives indicated with an "X"  CARDIOVASCULAR:  [ ]  chest pain   [ ]  chest pressure   [ ]  palpitations   [ ]  orthopnea   [ ]  dyspnea on exertion   [ ]  claudication   [ ]  rest pain   [ ]  DVT   [ ]  phlebitis PULMONARY:   [ ]  productive cough   [ ]  asthma   [ ]  wheezing NEUROLOGIC:   [ ]  weakness  [ ]  paresthesias  [ ]  aphasia  [ ]  amaurosis  [ ]  dizziness HEMATOLOGIC:   [ ]  bleeding problems   [ ]  clotting disorders MUSCULOSKELETAL:  [ ]  joint pain   [ ]  joint swelling GASTROINTESTINAL: [ ]   blood in stool  [ ]   hematemesis GENITOURINARY:  [ ]   dysuria  [ ]   hematuria PSYCHIATRIC:  [ ]  history of major depression INTEGUMENTARY:  [ ]  rashes  [ ]  ulcers CONSTITUTIONAL:  [ ]  fever   [ ]  chills  PHYSICAL EXAMINATION:  General: The patient is a well-nourished female, in no acute distress. Vital signs are BP 116/64  Pulse 61  Temp(Src) 98.5 F (36.9 C) (Oral)  Ht 5\' 6"  (1.676 m)  Wt 124 lb 6.4 oz (56.427 kg)  BMI 20.09 kg/m2  SpO2 99% Pulmonary: There is a good air exchange bilaterally without wheezing or rales. Abdomen:  Soft and non-tender with normal pitch bowel sounds. She is thin but does have a very prominent aortic pulsation with no tenderness over this Musculoskeletal: There are no major deformities.  There is no  significant extremity pain. Neurologic: No focal weakness or paresthesias are detected, Skin: There are no ulcer or rashes noted. Does have some cyanosis of her toes on both feet. Does have a callus formation on the undersurface of her left fourth toe and a callus between her third and fourth which are painful no fluctuance and no erythema Psychiatric: The patient has normal affect. Cardiovascular: There is a regular rate and rhythm without significant murmur appreciated. Pulse status: 2+ radial 2+ femoral 2+ popliteal pulses. 1-2+ dorsalis pedis and posterior tibial pulses bilaterally  [facility] Vascular Lab Studies:  Ordered and Independently Reviewed normal ankle arm index bilaterally and normal biphasic wave forms bilaterally  Impression and Plan:  Unclear etiology as to the reason for her pain in her fourth toe. I do not feel this is related arterial insufficiency. She will continue her followup with Dr.Hewitt with the MRI of this area pending. She is frustrated not knowing the cause of his discomfort.  She does have a prominent aortic pulsation. I discussed this with her and suspected this is related to her being thin. This is outside of a normal round. We will obtain an ultrasound of her aorta to rule out any aneurysmal change and will discuss this with her following the study    Azya Barbero [facility] Office: [phone]

## 2014-01-02 NOTE — Addendum Note (Signed)
Addended by: Dorthula Rue L on: 01/02/2014 04:42 PM   Modules accepted: Orders

## 2014-01-05 ENCOUNTER — Inpatient Hospital Stay (HOSPITAL_COMMUNITY): Admission: RE | Admit: 2014-01-05 | Payer: Medicare Other | Source: Ambulatory Visit

## 2014-01-24 ENCOUNTER — Ambulatory Visit (HOSPITAL_COMMUNITY)
Admission: RE | Admit: 2014-01-24 | Discharge: 2014-01-24 | Disposition: A | Discharge status: Home / Self Care | Payer: Medicare Other | Source: Ambulatory Visit | Attending: Vascular Surgery | Admitting: Vascular Surgery

## 2014-01-24 ENCOUNTER — Other Ambulatory Visit: Payer: Self-pay | Admitting: Vascular Surgery

## 2014-01-24 DIAGNOSIS — I739 Peripheral vascular disease, unspecified: Secondary | ICD-10-CM

## 2014-01-24 DIAGNOSIS — L98499 Non-pressure chronic ulcer of skin of other sites with unspecified severity: Principal | ICD-10-CM

## 2014-01-24 DIAGNOSIS — I714 Abdominal aortic aneurysm, without rupture, unspecified: Secondary | ICD-10-CM

## 2014-01-31 ENCOUNTER — Encounter: Payer: Self-pay | Admitting: Cardiovascular Disease

## 2014-01-31 ENCOUNTER — Ambulatory Visit (INDEPENDENT_AMBULATORY_CARE_PROVIDER_SITE_OTHER): Payer: Medicare Other | Admitting: Cardiovascular Disease

## 2014-01-31 VITALS — BP 122/67 | HR 54 | Ht 66.0 in | Wt 124.0 lb

## 2014-01-31 DIAGNOSIS — I2510 Atherosclerotic heart disease of native coronary artery without angina pectoris: Secondary | ICD-10-CM

## 2014-01-31 DIAGNOSIS — E785 Hyperlipidemia, unspecified: Secondary | ICD-10-CM

## 2014-01-31 DIAGNOSIS — I10 Essential (primary) hypertension: Secondary | ICD-10-CM

## 2014-01-31 NOTE — Patient Instructions (Signed)
Your physician has recommended you make the following change in your medication: STOP Plavix  Your physician wants you to follow-up in: 1 YEAR with Dr Burt Knack. You will receive a reminder letter in the mail two months in advance. If you don't receive a letter, please call our office to schedule the follow-up appointment.  Please drink a glass of tonic water every night and perform calf stretches.

## 2014-01-31 NOTE — Progress Notes (Signed)
HPI:  78 year old woman presenting for followup evaluation. She underwent PCI of the right coronary artery in February 2014 after presenting with symptoms of unstable angina. She was treated with overlapping drug-eluting stents and adjunctive rotational atherectomy of the RCA. Her left ventricular ejection fraction was 55-60%.  She is doing well from a cardiac perspective. She denies chest pain, palpitations, or shortness of breath. She complains of nocturnal leg cramps which are quite severe. These only occur at nighttime. They are affected both calf muscles. She has to get out of bed and walk around. She also complains of a resting tremor in her hands. She complains of easy bruising. She's had some problems with her foot and has been followed by Dr. Doran Durand. She saw Dr Donnetta Hutching and there was no evidence of vascular disease. He checked an abdominal ultrasound because of a prominent aortic pulsation - this showed no evidence of AAA.  Outpatient Encounter Prescriptions as of 01/31/2014  Medication Sig  . ALPRAZolam (XANAX) 0.5 MG tablet TAKE 1 TABLET BY MOUTH daily  AS NEEDED  . aspirin EC 81 MG tablet Taking two tabs daily  . clopidogrel (PLAVIX) 75 MG tablet Take 1 tablet (75 mg total) by mouth daily with breakfast.  . escitalopram (LEXAPRO) 20 MG tablet TAKE 1 TABLET (20 MG TOTAL) BY MOUTH DAILY.  Marland Kitchen losartan (COZAAR) 50 MG tablet Take 1 tablet (50 mg total) by mouth daily.  . Magnesium 250 MG TABS Take 250 mg by mouth daily.   . nitroGLYCERIN (NITROSTAT) 0.4 MG SL tablet Place 1 tablet (0.4 mg total) under the tongue every 5 (five) minutes as needed for chest pain.  . Omega-3 Fatty Acids (FISH OIL) 1000 MG CPDR Take 1 capsule by mouth 2 (two) times daily.    . Probiotic Product (PROBIOTIC ACIDOPHILUS) CAPS Take 1 tablet by mouth daily.    . simvastatin (ZOCOR) 40 MG tablet Take 1 tablet (40 mg total) by mouth at bedtime.  . [DISCONTINUED] Calcium Carbonate (CALTRATE 600) 1500 MG TABS Take 1 tablet  by mouth 2 (two) times daily.    . [DISCONTINUED] Cholecalciferol (VITAMIN D) 1000 UNITS capsule Take 1,000 Units by mouth daily.    . [DISCONTINUED] escitalopram (LEXAPRO) 10 MG tablet TAKE 1 TABLET BY MOUTH DAILY.  . [DISCONTINUED] escitalopram (LEXAPRO) 10 MG tablet TAKE 1 TABLET BY MOUTH DAILY.  . [DISCONTINUED] promethazine (PHENERGAN) 25 MG tablet Take 0.5-1 tablets (12.5-25 mg total) by mouth every 6 (six) hours as needed for nausea.  . [DISCONTINUED] zinc gluconate 50 MG tablet Take 50 mg by mouth daily.      Allergies  Allergen Reactions  . Codeine     REACTION: severe nausea    Past Medical History  Diagnosis Date  . Hx of colonic polyp 2006    diminutive '06, no polyps on 4/09 colo - for repeat 4/14  . IBS (irritable bowel syndrome)   . Erosive gastritis 2006    NSAID precipitated, resolved on f/u EGD  . MIGRAINE HEADACHE   . PULMONARY NODULE   . PVD     normal ABI/TBI 09/2009, chronic L toe pain and vasospasm (Raynauds)  . POSTMENOPAUSAL SYNDROME   . RENAL CYST   . Hypertension   . Hyperlipidemia   . DEPRESSION   . Coronary artery disease     a. Cath 11/17/12 - severe Ca++ moderately severe disease in the mid RCA, otherwise nonobstructive disease - s/p PTCA/rotational atherectomy & 2 DES to mid RCA 11/22/12.  . Sinus bradycardia  a. not on BB due to this.   ROS: Negative except as per HPI  BP 122/67  Pulse 54  Ht 5\' 6"  (1.676 m)  Wt 124 lb (56.246 kg)  BMI 20.02 kg/m2  PHYSICAL EXAM: Pt is alert and oriented, thin woman in NAD HEENT: normal Neck: JVP - normal, carotids 2+= without bruits Lungs: CTA bilaterally CV: RRR without murmur or gallop Abd: soft, NT, Positive BS, no hepatomegaly Ext: no C/C/E, distal pulses intact and equal Skin: warm/dry no rash, few areas of bruising noted on lower legs.  EKG:  Sinus bradycardia 54 beats per minute, left axis deviation, cannot rule out age-indeterminate septal infarct.  ASSESSMENT AND PLAN: 1. Coronary  artery disease, native vessel. She is stable without symptoms of angina. She is greater than 12 months out from PCI. We discussed pros and cons of continuing Plavix. We discussed results of the DAPT Trial which showed small but statistically significant reduction in MACE with continued plavix. Considering her significant bruising, she would like to go ahead and stop Plavix. I think this is certainly reasonable and it is guideline directed. She will followup in one year.  2. Hypertension. Blood pressure is well controlled on losartan.  3. Hyperlipidemia. Last lipids from September 2014 reviewed with a cholesterol 176, triglycerides 26, HDL 94, and LDL 77. She will continue on simvastatin.  4. Nocturnal leg cramps. Advised increased fluid intake in the evening, tonic water at bedtime, and calf muscle stretches at bedtime.  5. Hand tremor. Advised to followup with Dr Asa Lente as scheduled.  Sherren Mocha 01/31/2014 10:05 AM

## 2014-02-26 ENCOUNTER — Ambulatory Visit: Payer: Medicare Other | Admitting: Internal Medicine

## 2014-03-01 ENCOUNTER — Ambulatory Visit (INDEPENDENT_AMBULATORY_CARE_PROVIDER_SITE_OTHER): Payer: Medicare Other | Admitting: Internal Medicine

## 2014-03-01 ENCOUNTER — Encounter: Payer: Self-pay | Admitting: Internal Medicine

## 2014-03-01 VITALS — BP 110/78 | HR 54 | Temp 98.2°F | Wt 126.4 lb

## 2014-03-01 DIAGNOSIS — I2510 Atherosclerotic heart disease of native coronary artery without angina pectoris: Secondary | ICD-10-CM

## 2014-03-01 DIAGNOSIS — R251 Tremor, unspecified: Secondary | ICD-10-CM

## 2014-03-01 DIAGNOSIS — R259 Unspecified abnormal involuntary movements: Secondary | ICD-10-CM

## 2014-03-01 DIAGNOSIS — G4700 Insomnia, unspecified: Secondary | ICD-10-CM

## 2014-03-01 NOTE — Progress Notes (Signed)
Subjective:    Patient ID: Michele Burns, female    DOB: 1936-03-24, 78 y.o.   MRN: [id number]  HPI  Patient is here for follow up  Reviewed chronic medical issues and interval medical events  Past Medical History  Diagnosis Date  . Hx of colonic polyp 2006    diminutive '06, no polyps on 4/09 colo - for repeat 4/14  . IBS (irritable bowel syndrome)   . Erosive gastritis 2006    NSAID precipitated, resolved on f/u EGD  . MIGRAINE HEADACHE   . PULMONARY NODULE   . PVD     normal ABI/TBI 09/2009, chronic L toe pain and vasospasm (Raynauds)  . POSTMENOPAUSAL SYNDROME   . RENAL CYST   . Hypertension   . Hyperlipidemia   . DEPRESSION   . Coronary artery disease     a. Cath 11/17/12 - severe Ca++ moderately severe disease in the mid RCA, otherwise nonobstructive disease - s/p PTCA/rotational atherectomy & 2 DES to mid RCA 11/22/12.  . Sinus bradycardia     a. not on BB due to this.    Review of Systems  Respiratory: Negative for cough and shortness of breath.   Cardiovascular: Negative for chest pain and leg swelling.  Neurological: Positive for tremors (R hand, intermittent, >1 yr - intention>rest). Negative for dizziness, seizures, syncope, facial asymmetry, weakness, numbness and headaches.  Psychiatric/Behavioral: Positive for sleep disturbance (wake 3-4am, to bed 8-10p). Negative for suicidal ideas and dysphoric mood. The patient is not nervous/anxious.        Objective:   Physical Exam  BP 110/78  Pulse 54  Temp(Src) 98.2 F (36.8 C) (Oral)  Wt 126 lb 6.4 oz (57.335 kg)  SpO2 98% Wt Readings from Last 3 Encounters:  03/01/14 126 lb 6.4 oz (57.335 kg)  01/31/14 124 lb (56.246 kg)  01/02/14 124 lb 6.4 oz (56.427 kg)   Constitutional: She appears well-developed and well-nourished. No distress.  Neck: Normal range of motion. Neck supple. No JVD present. No thyromegaly present.  Cardiovascular: Normal rate, regular rhythm and normal heart sounds.  No murmur heard.  No BLE edema. Pulmonary/Chest: Effort normal and breath sounds normal. No respiratory distress. She has no wheezes.  Neurologic: Awake, alert and oriented x4. No resting tremor bilaterally. Strength bilateral hands with good grip and interdigital strength. No gross deficits. Cranial nerves II through XII symmetrically intact. Voice stable without tremor Psychiatric: She has a normal mood and affect. Her behavior is normal. Judgment and thought content normal.   Lab Results  Component Value Date   WBC 3.6* 07/06/2013   HGB 12.8 07/06/2013   HCT 37.7 07/06/2013   PLT 180.0 07/06/2013   GLUCOSE 78 07/06/2013   CHOL 176 07/06/2013   TRIG 26.0 07/06/2013   HDL 93.70 07/06/2013   LDLCALC 77 07/06/2013   ALT 18 07/06/2013   AST 24 07/06/2013   NA 140 07/06/2013   K 4.5 07/06/2013   CL 106 07/06/2013   CREATININE 0.9 07/06/2013   BUN 23 07/06/2013   CO2 30 07/06/2013   TSH 2.26 07/06/2013   INR 0.95 11/17/2012    No results found.     Assessment & Plan:   Insomnia. Patient reports "early awakening" 3-4am after 6+ hours of restful sleep. Explained normal process. Discussed sleep hygiene and exacerbating effects of wine and alprazolam use. Okay for over-the-counter sleep aids if needed or additional half tablet of Xanax as needed  Problem List Items Addressed This Visit   CAD (  coronary artery disease)     Abnormal stress test prompting cardiac cath February 2014 PTCA to RCA November 22, 2012 reviewed Medical management ongoing, new addition of Plavix 11/2012 holding beta blocker because of sinus bradycardia Following with cardiology for same, no recurrent symptoms Lipids at goal, continue statin    Tremor - Primary     Progressive right hand symptoms (dominate side) Reports illegible handwriting because of same Intermittent symptoms, worse with intention but occasionally at rest Chest potential effects of essential tremor, but no family history to compare with same due to premature death of family  members Also discussed effects of wine intake, benzodiazepine use on same Neuro exam benign, no resting tremor present at this time Discussed possibility of MRI brain and/or neurology review of symptoms, patient declines need for both at this time Patient encouraged to keep symptom journal of days and timing of symptomatic events, also can use half tablet Xanax on the days when symptoms bothersome Patient will review same in 6 months, agrees to call sooner if worse      Other Visit Diagnoses   Insomnia           Time spent with pt today 30 minutes, greater than 50% time spent counseling patient on tremor, sleep, CAD and medication review. Also review of prior records

## 2014-03-01 NOTE — Assessment & Plan Note (Signed)
Abnormal stress test prompting cardiac cath February 2014 PTCA to RCA November 22, 2012 reviewed Medical management ongoing, new addition of Plavix 11/2012 holding beta blocker because of sinus bradycardia Following with cardiology for same, no recurrent symptoms Lipids at goal, continue statin

## 2014-03-01 NOTE — Progress Notes (Signed)
Pre visit review using our clinic review tool, if applicable. No additional management support is needed unless otherwise documented below in the visit note. 

## 2014-03-01 NOTE — Patient Instructions (Signed)
It was good to see you today.  We have reviewed your prior records including labs and tests today  Medications reviewed and updated, no changes recommended at this time. Keep journal of "tremor" symptoms as discussed  Please schedule followup in 6 months with "annual" labs, call sooner if problems.

## 2014-03-01 NOTE — Assessment & Plan Note (Addendum)
Progressive right hand symptoms (dominate side) Reports illegible handwriting because of same Intermittent symptoms, worse with intention but occasionally at rest Chest potential effects of essential tremor, but no family history to compare with same due to premature death of family members Also discussed effects of wine intake, benzodiazepine use on same Neuro exam benign, no resting tremor present at this time Discussed possibility of MRI brain and/or neurology review of symptoms, patient declines need for both at this time Patient encouraged to keep symptom journal of days and timing of symptomatic events, also can use half tablet Xanax on the days when symptoms bothersome Patient will review same in 6 months, agrees to call sooner if worse

## 2014-03-25 ENCOUNTER — Other Ambulatory Visit: Payer: Self-pay | Admitting: Internal Medicine

## 2014-03-26 NOTE — Telephone Encounter (Signed)
[facility].../lmb 

## 2014-04-26 ENCOUNTER — Encounter: Payer: Self-pay | Admitting: Internal Medicine

## 2014-04-27 ENCOUNTER — Other Ambulatory Visit: Payer: Self-pay | Admitting: *Deleted

## 2014-04-27 MED ORDER — SIMVASTATIN 40 MG PO TABS: 40.0000 mg | ORAL_TABLET | Freq: Every day | ORAL | Status: DC

## 2014-04-27 MED ORDER — LOSARTAN POTASSIUM 50 MG PO TABS: 50.0000 mg | ORAL_TABLET | Freq: Every day | ORAL | Status: DC

## 2014-05-13 ENCOUNTER — Other Ambulatory Visit: Payer: Self-pay | Admitting: Internal Medicine

## 2014-05-14 ENCOUNTER — Other Ambulatory Visit: Payer: Self-pay | Admitting: Internal Medicine

## 2014-06-11 ENCOUNTER — Telehealth: Payer: Self-pay | Admitting: *Deleted

## 2014-06-11 NOTE — Telephone Encounter (Signed)
Pt states she is wanting to cut back her hours to part-time, but she is needing a letter from md stating that due to her medical reason she need to work part-time. Pt states she had a stent put in 15 months ago just don't have the energy to work full. Inform pt md is out of the office this week. Will give her a call once i hear back from md.../lmb

## 2014-06-12 NOTE — Telephone Encounter (Signed)
Called pt no answer LMOM RTC.../lmb 

## 2014-06-12 NOTE — Telephone Encounter (Signed)
If this is temp request, i'll be happy to complete an FMLA form. If this is perm reduced ability to work, i do not complete disability forms/letters. Since pt feels sx related to cardiac condition (stent), may also benefit pt to request f/ her cardiologist thanks

## 2014-06-13 NOTE — Telephone Encounter (Signed)
Pt return call back gave her md response. Pt stated she will also talk with Dr. Burt Knack when she have an appt to discuss as well...Michele Burns

## 2014-06-29 ENCOUNTER — Encounter (HOSPITAL_COMMUNITY): Payer: Medicare Other | Admitting: Anesthesiology

## 2014-06-29 ENCOUNTER — Observation Stay (HOSPITAL_COMMUNITY): Payer: Medicare Other | Admitting: Anesthesiology

## 2014-06-29 ENCOUNTER — Inpatient Hospital Stay (HOSPITAL_COMMUNITY)
Admission: EM | Admit: 2014-06-29 | Discharge: 2014-07-04 | DRG: 330 | Disposition: A | Discharge status: Home / Self Care | Payer: Medicare Other | Attending: Surgery | Admitting: Surgery

## 2014-06-29 ENCOUNTER — Encounter (HOSPITAL_COMMUNITY): Admission: EM | Disposition: A | Discharge status: Home / Self Care | Payer: Self-pay | Source: Home / Self Care

## 2014-06-29 ENCOUNTER — Emergency Department (HOSPITAL_COMMUNITY): Payer: Medicare Other

## 2014-06-29 ENCOUNTER — Encounter (HOSPITAL_COMMUNITY): Payer: Self-pay | Admitting: Emergency Medicine

## 2014-06-29 DIAGNOSIS — Z9861 Coronary angioplasty status: Secondary | ICD-10-CM | POA: Diagnosis not present

## 2014-06-29 DIAGNOSIS — F329 Major depressive disorder, single episode, unspecified: Secondary | ICD-10-CM | POA: Diagnosis present

## 2014-06-29 DIAGNOSIS — I4460 Unspecified fascicular block: Secondary | ICD-10-CM | POA: Diagnosis present

## 2014-06-29 DIAGNOSIS — K562 Volvulus: Secondary | ICD-10-CM | POA: Diagnosis present

## 2014-06-29 DIAGNOSIS — X58XXXA Exposure to other specified factors, initial encounter: Secondary | ICD-10-CM | POA: Diagnosis present

## 2014-06-29 DIAGNOSIS — I2510 Atherosclerotic heart disease of native coronary artery without angina pectoris: Secondary | ICD-10-CM | POA: Diagnosis present

## 2014-06-29 DIAGNOSIS — Y9210 Unspecified residential institution as the place of occurrence of the external cause: Secondary | ICD-10-CM | POA: Diagnosis not present

## 2014-06-29 DIAGNOSIS — K589 Irritable bowel syndrome without diarrhea: Secondary | ICD-10-CM | POA: Diagnosis present

## 2014-06-29 DIAGNOSIS — I10 Essential (primary) hypertension: Secondary | ICD-10-CM | POA: Diagnosis present

## 2014-06-29 DIAGNOSIS — F3289 Other specified depressive episodes: Secondary | ICD-10-CM | POA: Diagnosis present

## 2014-06-29 DIAGNOSIS — Y849 Medical procedure, unspecified as the cause of abnormal reaction of the patient, or of later complication, without mention of misadventure at the time of the procedure: Secondary | ICD-10-CM | POA: Diagnosis not present

## 2014-06-29 DIAGNOSIS — I498 Other specified cardiac arrhythmias: Secondary | ICD-10-CM | POA: Diagnosis present

## 2014-06-29 DIAGNOSIS — R109 Unspecified abdominal pain: Secondary | ICD-10-CM | POA: Diagnosis present

## 2014-06-29 DIAGNOSIS — Z87891 Personal history of nicotine dependence: Secondary | ICD-10-CM

## 2014-06-29 DIAGNOSIS — E785 Hyperlipidemia, unspecified: Secondary | ICD-10-CM | POA: Diagnosis present

## 2014-06-29 DIAGNOSIS — Z860100 Personal history of colon polyps, unspecified: Secondary | ICD-10-CM

## 2014-06-29 DIAGNOSIS — IMO0002 Reserved for concepts with insufficient information to code with codable children: Secondary | ICD-10-CM | POA: Diagnosis not present

## 2014-06-29 DIAGNOSIS — Z8249 Family history of ischemic heart disease and other diseases of the circulatory system: Secondary | ICD-10-CM

## 2014-06-29 HISTORY — DX: Volvulus: K56.2

## 2014-06-29 HISTORY — PX: LAPAROTOMY: SHX154

## 2014-06-29 HISTORY — PX: PARTIAL COLECTOMY: SHX5273

## 2014-06-29 HISTORY — PX: LAPAROSCOPIC RIGHT HEMI COLECTOMY: SHX5926

## 2014-06-29 LAB — COMPREHENSIVE METABOLIC PANEL
ALT: 26 U/L (ref 0–35)
AST: 26 U/L (ref 0–37)
Albumin: 4 g/dL (ref 3.5–5.2)
Alkaline Phosphatase: 66 U/L (ref 39–117)
Anion gap: 14 (ref 5–15)
BUN: 19 mg/dL (ref 6–23)
CO2: 26 mEq/L (ref 19–32)
Calcium: 9.4 mg/dL (ref 8.4–10.5)
Chloride: 102 mEq/L (ref 96–112)
Creatinine, Ser: 0.71 mg/dL (ref 0.50–1.10)
GFR calc non Af Amer: 82 mL/min — ABNORMAL LOW (ref >90)
GLUCOSE: 109 mg/dL — AB (ref 70–99)
Potassium: 3.9 mEq/L (ref 3.7–5.3)
Sodium: 142 mEq/L (ref 137–147)
Total Bilirubin: 0.4 mg/dL (ref 0.3–1.2)
Total Protein: 6.9 g/dL (ref 6.0–8.3)

## 2014-06-29 LAB — CBC WITH DIFFERENTIAL/PLATELET
Basophils Absolute: 0 10*3/uL (ref 0.0–0.1)
Basophils Relative: 0 % (ref 0–1)
EOS PCT: 3 % (ref 0–5)
Eosinophils Absolute: 0.2 10*3/uL (ref 0.0–0.7)
HCT: 35.6 % — ABNORMAL LOW (ref 36.0–46.0)
Hemoglobin: 12.2 g/dL (ref 12.0–15.0)
LYMPHS ABS: 1.2 10*3/uL (ref 0.7–4.0)
Lymphocytes Relative: 22 % (ref 12–46)
MCH: 31.1 pg (ref 26.0–34.0)
MCHC: 34.3 g/dL (ref 30.0–36.0)
MCV: 90.8 fL (ref 78.0–100.0)
Monocytes Absolute: 0.4 10*3/uL (ref 0.1–1.0)
Monocytes Relative: 8 % (ref 3–12)
Neutro Abs: 3.6 10*3/uL (ref 1.7–7.7)
Neutrophils Relative %: 67 % (ref 43–77)
Platelets: 196 10*3/uL (ref 150–400)
RBC: 3.92 MIL/uL (ref 3.87–5.11)
RDW: 12.8 % (ref 11.5–15.5)
WBC: 5.4 10*3/uL (ref 4.0–10.5)

## 2014-06-29 LAB — LIPASE, BLOOD: Lipase: 31 U/L (ref 11–59)

## 2014-06-29 SURGERY — LAPAROTOMY, EXPLORATORY
Anesthesia: General | Site: Abdomen

## 2014-06-29 MED ORDER — ONDANSETRON HCL 4 MG/2ML IJ SOLN: 4.0000 mg | Freq: Four times a day (QID) | INTRAMUSCULAR | Status: DC | PRN

## 2014-06-29 MED ORDER — GLYCOPYRROLATE 0.2 MG/ML IJ SOLN
INTRAMUSCULAR | Status: DC | PRN
  Administered 2014-06-29: .7 mg via INTRAVENOUS

## 2014-06-29 MED ORDER — ESCITALOPRAM OXALATE 10 MG PO TABS
20.0000 mg | ORAL_TABLET | Freq: Every day | ORAL | Status: DC
  Administered 2014-06-30 – 2014-07-04 (×5): 20 mg via ORAL
  Filled 2014-06-29 (×5): qty 2

## 2014-06-29 MED ORDER — ATROPINE SULFATE 1 MG/ML IJ SOLN
INTRAMUSCULAR | Status: DC | PRN
  Administered 2014-06-29: 0.2 mg via INTRAVENOUS

## 2014-06-29 MED ORDER — ARTIFICIAL TEARS OP OINT
TOPICAL_OINTMENT | OPHTHALMIC | Status: AC
  Filled 2014-06-29: qty 3.5

## 2014-06-29 MED ORDER — SODIUM CHLORIDE 0.9 % IV BOLUS (SEPSIS)
1000.0000 mL | Freq: Once | INTRAVENOUS | Status: AC
  Administered 2014-06-29: 1000 mL via INTRAVENOUS

## 2014-06-29 MED ORDER — ONDANSETRON HCL 4 MG/2ML IJ SOLN
INTRAMUSCULAR | Status: DC | PRN
  Administered 2014-06-29: 4 mg via INTRAVENOUS

## 2014-06-29 MED ORDER — DEXAMETHASONE SODIUM PHOSPHATE 10 MG/ML IJ SOLN
INTRAMUSCULAR | Status: DC | PRN
  Administered 2014-06-29: 4 mg via INTRAVENOUS

## 2014-06-29 MED ORDER — IOHEXOL 300 MG/ML  SOLN
25.0000 mL | Freq: Once | INTRAMUSCULAR | Status: AC | PRN
  Administered 2014-06-29: 25 mL via ORAL

## 2014-06-29 MED ORDER — PROMETHAZINE HCL 25 MG/ML IJ SOLN
6.2500 mg | INTRAMUSCULAR | Status: DC | PRN
Start: 2014-06-29 — End: 2014-06-29

## 2014-06-29 MED ORDER — LOSARTAN POTASSIUM 50 MG PO TABS
50.0000 mg | ORAL_TABLET | Freq: Every day | ORAL | Status: DC
  Administered 2014-06-30 – 2014-07-04 (×5): 50 mg via ORAL
  Filled 2014-06-29 (×5): qty 1

## 2014-06-29 MED ORDER — ROCURONIUM BROMIDE 50 MG/5ML IV SOLN
INTRAVENOUS | Status: AC
  Filled 2014-06-29: qty 1

## 2014-06-29 MED ORDER — LACTATED RINGERS IV SOLN
INTRAVENOUS | Status: DC | PRN
  Administered 2014-06-29 (×2): via INTRAVENOUS

## 2014-06-29 MED ORDER — PROPOFOL 10 MG/ML IV BOLUS
INTRAVENOUS | Status: DC | PRN
  Administered 2014-06-29: 100 mg via INTRAVENOUS

## 2014-06-29 MED ORDER — KETOROLAC TROMETHAMINE 30 MG/ML IJ SOLN
30.0000 mg | Freq: Once | INTRAMUSCULAR | Status: AC
  Administered 2014-06-29: 30 mg via INTRAVENOUS
  Filled 2014-06-29: qty 1

## 2014-06-29 MED ORDER — NALOXONE HCL 0.4 MG/ML IJ SOLN: 0.4000 mg | INTRAMUSCULAR | Status: DC | PRN

## 2014-06-29 MED ORDER — IOHEXOL 300 MG/ML  SOLN
100.0000 mL | Freq: Once | INTRAMUSCULAR | Status: AC | PRN
Start: 2014-06-29 — End: 2014-06-29
  Administered 2014-06-29: 100 mL via INTRAVENOUS

## 2014-06-29 MED ORDER — SUCCINYLCHOLINE CHLORIDE 20 MG/ML IJ SOLN
INTRAMUSCULAR | Status: DC | PRN
  Administered 2014-06-29: 100 mg via INTRAVENOUS

## 2014-06-29 MED ORDER — NEOSTIGMINE METHYLSULFATE 10 MG/10ML IV SOLN
INTRAVENOUS | Status: DC | PRN
  Administered 2014-06-29: 4 mg via INTRAVENOUS

## 2014-06-29 MED ORDER — FENTANYL CITRATE 0.05 MG/ML IJ SOLN
INTRAMUSCULAR | Status: AC
  Filled 2014-06-29: qty 5

## 2014-06-29 MED ORDER — MORPHINE SULFATE (PF) 1 MG/ML IV SOLN
INTRAVENOUS | Status: AC
  Filled 2014-06-29: qty 25

## 2014-06-29 MED ORDER — 0.9 % SODIUM CHLORIDE (POUR BTL) OPTIME
TOPICAL | Status: DC | PRN
  Administered 2014-06-29 (×2): 1000 mL

## 2014-06-29 MED ORDER — ONDANSETRON HCL 4 MG/2ML IJ SOLN
INTRAMUSCULAR | Status: AC
  Filled 2014-06-29: qty 2

## 2014-06-29 MED ORDER — ALPRAZOLAM 0.25 MG PO TABS
0.5000 mg | ORAL_TABLET | Freq: Every day | ORAL | Status: DC
  Administered 2014-06-30 – 2014-07-03 (×4): 0.5 mg via ORAL
  Filled 2014-06-29 (×4): qty 2

## 2014-06-29 MED ORDER — MORPHINE SULFATE 2 MG/ML IJ SOLN: 1.0000 mg | INTRAMUSCULAR | Status: DC | PRN

## 2014-06-29 MED ORDER — ARTIFICIAL TEARS OP OINT
TOPICAL_OINTMENT | OPHTHALMIC | Status: DC | PRN
  Administered 2014-06-29: 1 via OPHTHALMIC

## 2014-06-29 MED ORDER — EPHEDRINE SULFATE 50 MG/ML IJ SOLN
INTRAMUSCULAR | Status: DC | PRN
  Administered 2014-06-29: 10 mg via INTRAVENOUS
  Administered 2014-06-29: 15 mg via INTRAVENOUS

## 2014-06-29 MED ORDER — OXYCODONE HCL 5 MG/5ML PO SOLN: 5.0000 mg | Freq: Once | ORAL | Status: DC | PRN

## 2014-06-29 MED ORDER — DEXTROSE 5 % IV SOLN
2.0000 g | INTRAVENOUS | Status: AC
  Administered 2014-06-29: 2 g via INTRAVENOUS
  Filled 2014-06-29: qty 2

## 2014-06-29 MED ORDER — GLYCOPYRROLATE 0.2 MG/ML IJ SOLN
INTRAMUSCULAR | Status: AC
  Filled 2014-06-29: qty 4

## 2014-06-29 MED ORDER — DIPHENHYDRAMINE HCL 12.5 MG/5ML PO ELIX: 12.5000 mg | ORAL_SOLUTION | Freq: Four times a day (QID) | ORAL | Status: DC | PRN

## 2014-06-29 MED ORDER — DIPHENHYDRAMINE HCL 50 MG/ML IJ SOLN: 12.5000 mg | Freq: Four times a day (QID) | INTRAMUSCULAR | Status: DC | PRN

## 2014-06-29 MED ORDER — LIDOCAINE HCL (CARDIAC) 20 MG/ML IV SOLN
INTRAVENOUS | Status: AC
  Filled 2014-06-29: qty 5

## 2014-06-29 MED ORDER — MIDAZOLAM HCL 2 MG/2ML IJ SOLN
INTRAMUSCULAR | Status: AC
  Filled 2014-06-29: qty 2

## 2014-06-29 MED ORDER — MORPHINE SULFATE 4 MG/ML IJ SOLN: 4.0000 mg | Freq: Once | INTRAMUSCULAR | Status: DC

## 2014-06-29 MED ORDER — FENTANYL CITRATE 0.05 MG/ML IJ SOLN
INTRAMUSCULAR | Status: AC
  Administered 2014-06-29: 25 ug via INTRAVENOUS
  Filled 2014-06-29: qty 2

## 2014-06-29 MED ORDER — OXYCODONE HCL 5 MG PO TABS: 5.0000 mg | ORAL_TABLET | Freq: Once | ORAL | Status: DC | PRN

## 2014-06-29 MED ORDER — EPHEDRINE SULFATE 50 MG/ML IJ SOLN
INTRAMUSCULAR | Status: AC
  Filled 2014-06-29: qty 1

## 2014-06-29 MED ORDER — SODIUM CHLORIDE 0.9 % IJ SOLN
INTRAMUSCULAR | Status: AC
  Filled 2014-06-29: qty 10

## 2014-06-29 MED ORDER — LIDOCAINE HCL (CARDIAC) 20 MG/ML IV SOLN
INTRAVENOUS | Status: DC | PRN
  Administered 2014-06-29: 50 mg via INTRAVENOUS

## 2014-06-29 MED ORDER — ONDANSETRON HCL 4 MG/2ML IJ SOLN: 4.0000 mg | INTRAMUSCULAR | Status: DC | PRN

## 2014-06-29 MED ORDER — MORPHINE SULFATE (PF) 1 MG/ML IV SOLN
INTRAVENOUS | Status: DC
  Administered 2014-06-29: 1 mg via INTRAVENOUS
  Administered 2014-06-30: 5.69 mg via INTRAVENOUS
  Administered 2014-06-30: 9 mg via INTRAVENOUS
  Administered 2014-06-30: 07:00:00 via INTRAVENOUS
  Filled 2014-06-29: qty 25

## 2014-06-29 MED ORDER — FENTANYL CITRATE 0.05 MG/ML IJ SOLN
25.0000 ug | INTRAMUSCULAR | Status: DC | PRN
  Administered 2014-06-29: 50 ug via INTRAVENOUS
  Administered 2014-06-29 (×2): 25 ug via INTRAVENOUS

## 2014-06-29 MED ORDER — SODIUM CHLORIDE 0.9 % IJ SOLN: 9.0000 mL | INTRAMUSCULAR | Status: DC | PRN

## 2014-06-29 MED ORDER — ROCURONIUM BROMIDE 100 MG/10ML IV SOLN
INTRAVENOUS | Status: DC | PRN
  Administered 2014-06-29: 40 mg via INTRAVENOUS

## 2014-06-29 MED ORDER — KCL IN DEXTROSE-NACL 20-5-0.45 MEQ/L-%-% IV SOLN
INTRAVENOUS | Status: DC
  Administered 2014-06-29 – 2014-07-03 (×8): via INTRAVENOUS
  Filled 2014-06-29 (×11): qty 1000

## 2014-06-29 MED ORDER — NITROGLYCERIN 0.4 MG SL SUBL: 0.4000 mg | SUBLINGUAL_TABLET | SUBLINGUAL | Status: DC | PRN

## 2014-06-29 MED ORDER — SUCCINYLCHOLINE CHLORIDE 20 MG/ML IJ SOLN
INTRAMUSCULAR | Status: AC
  Filled 2014-06-29: qty 1

## 2014-06-29 MED ORDER — FENTANYL CITRATE 0.05 MG/ML IJ SOLN
INTRAMUSCULAR | Status: DC | PRN
  Administered 2014-06-29: 50 ug via INTRAVENOUS
  Administered 2014-06-29: 100 ug via INTRAVENOUS
  Administered 2014-06-29 (×2): 50 ug via INTRAVENOUS

## 2014-06-29 MED ORDER — DEXAMETHASONE SODIUM PHOSPHATE 4 MG/ML IJ SOLN
INTRAMUSCULAR | Status: AC
  Filled 2014-06-29: qty 1

## 2014-06-29 MED ORDER — ATROPINE SULFATE 0.1 MG/ML IJ SOLN
INTRAMUSCULAR | Status: AC
  Filled 2014-06-29: qty 10

## 2014-06-29 MED ADMIN — Morphine Sulfate IV Soln PF 1 MG/ML: INTRAVENOUS | @ 13:00:00

## 2014-06-29 SURGICAL SUPPLY — 70 items
BLADE SURG ROTATE 9660 (MISCELLANEOUS) IMPLANT
CANISTER SUCTION 2500CC (MISCELLANEOUS) ×2 IMPLANT
CHLORAPREP W/TINT 26ML (MISCELLANEOUS) ×2 IMPLANT
COVER MAYO STAND STRL (DRAPES) ×2 IMPLANT
COVER SURGICAL LIGHT HANDLE (MISCELLANEOUS) ×4 IMPLANT
DRAPE LAPAROSCOPIC ABDOMINAL (DRAPES) ×2 IMPLANT
DRAPE PROXIMA HALF (DRAPES) IMPLANT
DRAPE UTILITY 15X26 W/TAPE STR (DRAPE) ×4 IMPLANT
DRAPE WARM FLUID 44X44 (DRAPE) ×2 IMPLANT
DRSG OPSITE POSTOP 4X10 (GAUZE/BANDAGES/DRESSINGS) ×2 IMPLANT
DRSG OPSITE POSTOP 4X8 (GAUZE/BANDAGES/DRESSINGS) IMPLANT
DRSG TELFA 3X8 NADH (GAUZE/BANDAGES/DRESSINGS) ×2 IMPLANT
ELECT BLADE 6.5 EXT (BLADE) ×2 IMPLANT
ELECT CAUTERY BLADE 6.4 (BLADE) ×4 IMPLANT
ELECT REM PT RETURN 9FT ADLT (ELECTROSURGICAL) ×2
ELECTRODE REM PT RTRN 9FT ADLT (ELECTROSURGICAL) ×1 IMPLANT
GLOVE BIO SURGEON STRL SZ 6 (GLOVE) ×4 IMPLANT
GLOVE BIO SURGEON STRL SZ8 (GLOVE) ×2 IMPLANT
GLOVE BIOGEL M 8.0 STRL (GLOVE) ×4 IMPLANT
GLOVE BIOGEL PI IND STRL 6.5 (GLOVE) ×2 IMPLANT
GLOVE BIOGEL PI IND STRL 7.0 (GLOVE) ×1 IMPLANT
GLOVE BIOGEL PI IND STRL 8 (GLOVE) ×1 IMPLANT
GLOVE BIOGEL PI INDICATOR 6.5 (GLOVE) ×2
GLOVE BIOGEL PI INDICATOR 7.0 (GLOVE) ×1
GLOVE BIOGEL PI INDICATOR 8 (GLOVE) ×1
GLOVE SURG SS PI 7.0 STRL IVOR (GLOVE) ×2 IMPLANT
GOWN STRL REUS W/ TWL LRG LVL3 (GOWN DISPOSABLE) IMPLANT
GOWN STRL REUS W/TWL 2XL LVL3 (GOWN DISPOSABLE) ×4 IMPLANT
GOWN STRL REUS W/TWL LRG LVL3 (GOWN DISPOSABLE)
KIT BASIN OR (CUSTOM PROCEDURE TRAY) ×2 IMPLANT
KIT ROOM TURNOVER OR (KITS) ×2 IMPLANT
LEGGING LITHOTOMY PAIR STRL (DRAPES) IMPLANT
LIGASURE IMPACT 36 18CM CVD LR (INSTRUMENTS) ×2 IMPLANT
NS IRRIG 1000ML POUR BTL (IV SOLUTION) ×4 IMPLANT
PACK GENERAL/GYN (CUSTOM PROCEDURE TRAY) ×2 IMPLANT
PAD ARMBOARD 7.5X6 YLW CONV (MISCELLANEOUS) ×2 IMPLANT
PENCIL BUTTON HOLSTER BLD 10FT (ELECTRODE) ×2 IMPLANT
RELOAD PROXIMATE 75MM BLUE (ENDOMECHANICALS) ×6 IMPLANT
RETRACTOR WND ALEXIS 25 LRG (MISCELLANEOUS) ×1 IMPLANT
RTRCTR WOUND ALEXIS 25CM LRG (MISCELLANEOUS) ×2
SPECIMEN JAR LARGE (MISCELLANEOUS) ×2 IMPLANT
SPONGE LAP 18X18 X RAY DECT (DISPOSABLE) ×2 IMPLANT
STAPLER GUN LINEAR PROX 60 (STAPLE) ×2 IMPLANT
STAPLER PROXIMATE 75MM BLUE (STAPLE) ×2 IMPLANT
STAPLER VISISTAT 35W (STAPLE) ×2 IMPLANT
SUCTION POOLE TIP (SUCTIONS) ×2 IMPLANT
SURGILUBE 2OZ TUBE FLIPTOP (MISCELLANEOUS) IMPLANT
SUT PDS AB 1 TP1 96 (SUTURE) ×4 IMPLANT
SUT PDS II 0 TP-1 LOOPED 60 (SUTURE) ×4 IMPLANT
SUT PROLENE 2 0 CT2 30 (SUTURE) IMPLANT
SUT PROLENE 2 0 KS (SUTURE) IMPLANT
SUT VIC AB 2-0 SH 18 (SUTURE) ×2 IMPLANT
SUT VIC AB 2-0 SH 27 (SUTURE) ×1
SUT VIC AB 2-0 SH 27X BRD (SUTURE) ×1 IMPLANT
SUT VIC AB 3-0 SH 18 (SUTURE) ×2 IMPLANT
SUT VIC AB 3-0 SH 27 (SUTURE)
SUT VIC AB 3-0 SH 27X BRD (SUTURE) IMPLANT
SUT VIC AB 3-0 SH 8-18 (SUTURE) IMPLANT
SUT VICRYL 4-0 PS2 18IN ABS (SUTURE) IMPLANT
SUT VICRYL AB 2 0 TIES (SUTURE) ×2 IMPLANT
SUT VICRYL AB 3 0 TIES (SUTURE) ×2 IMPLANT
SYR BULB IRRIGATION 50ML (SYRINGE) ×2 IMPLANT
TOWEL OR 17X24 6PK STRL BLUE (TOWEL DISPOSABLE) ×2 IMPLANT
TOWEL OR 17X26 10 PK STRL BLUE (TOWEL DISPOSABLE) ×4 IMPLANT
TRAY FOLEY CATH 14FRSI W/METER (CATHETERS) IMPLANT
TRAY FOLEY CATH 16FRSI W/METER (SET/KITS/TRAYS/PACK) ×2 IMPLANT
TRAY PROCTOSCOPIC FIBER OPTIC (SET/KITS/TRAYS/PACK) IMPLANT
TUBE CONNECTING 12X1/4 (SUCTIONS) ×2 IMPLANT
WATER STERILE IRR 1000ML POUR (IV SOLUTION) IMPLANT
YANKAUER SUCT BULB TIP NO VENT (SUCTIONS) ×2 IMPLANT

## 2014-06-29 NOTE — ED Notes (Signed)
Surgical MD at bedside. Pt ambulated to restroom and moved bowels; reports normal. Not wanting narcotics at this time.

## 2014-06-29 NOTE — ED Notes (Signed)
Pt. reports progressing low/mid abdominal pain onset this morning , denies nausea/vomitting or diarrhea . No fever or chills. Denies urinary discomfort.

## 2014-06-29 NOTE — Progress Notes (Signed)
Belongings in security received by NT and given to pt's family.  Pt's family took belongings (red purse) home with them. Michele Burns

## 2014-06-29 NOTE — Progress Notes (Signed)
Pt arrived from PACU with PCA setup, VSS. N/c on at 2L.  Dressing has old drainage on it.  Pt comfortable and oriented to unit/room/call bell.  Pt educated on how to use PCA pump.  Will continue to monitor and carry out orders. Syliva Overman

## 2014-06-29 NOTE — ED Provider Notes (Signed)
CSN: [id number]     Arrival date & time 06/29/14  0134 History   First MD Initiated Contact with Patient 06/29/14 [phone]     Chief Complaint  Patient presents with  . Abdominal Pain     (Consider location/radiation/quality/duration/timing/severity/associated sxs/prior Treatment) HPI  Michele Burns is a 78 y.o. female with a past medical history of hypertension, hyperlipidemia, coronary artery disease presenting today with abdominal pain. It started this morning and is diffuse but worse in the left lower quadrant. 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 She describes it as a cramping sensation. Nothing has made it better or worse during the interval. She has nausea with no episodes of emesis. She denies any urinary symptoms. There's been no fever or diaphoresis. The pain does not radiate. Patient otherwise has no further complaints.  10 Systems reviewed and are negative for acute change except as noted in the HPI.    Past Medical History  Diagnosis Date  . Hx of colonic polyp 2006    diminutive '06, no polyps on 4/09 colo - for repeat 4/14  . IBS (irritable bowel syndrome)   . Erosive gastritis 2006    NSAID precipitated, resolved on f/u EGD  . MIGRAINE HEADACHE   . PULMONARY NODULE   . PVD     normal ABI/TBI 09/2009, chronic L toe pain and vasospasm (Raynauds)  . POSTMENOPAUSAL SYNDROME   . RENAL CYST   . Hypertension   . Hyperlipidemia   . DEPRESSION   . Coronary artery disease     a. Cath 11/17/12 - severe Ca++ moderately severe disease in the mid RCA, otherwise nonobstructive disease - s/p PTCA/rotational atherectomy & 2 DES to mid RCA 11/22/12.  . Sinus bradycardia     a. not on BB due to this.   Past Surgical History  Procedure Laterality Date  . Tonsillectomy      As a child  . Vaginal hysterectomy    . Excision morton's neuroma  1971    L 3/4 toe space  . Artherectomy  11/22/2012    RCA  . Abdominal hysterectomy    . Tonsillectomy     . Cardiac valve surgery    . Esophagogastroduodenoscopy    . Colonoscopy     Family History  Problem Relation Age of Onset  . Heart attack Father 103    deceased  . Heart attack Mother 37    deceased  . Diabetes type I Mother   . Diabetes Mother   . Heart attack Brother 69    deceased  . Diabetes Paternal Grandmother    History  Substance Use Topics  . Smoking status: Former Smoker -- 30 years    Types: Cigarettes    Quit date: 10/12/1980  . Smokeless tobacco: Never Used     Comment: She is widowed since 2002-she has 3 grown children and 5 g-kids. Moved to [place] from [place] in 2010 to be close to family  . Alcohol Use: 6.0 oz/week    10 Glasses of wine per week     Comment: 1 per day   OB History   Grav Para Term Preterm Abortions TAB SAB Ect Mult Living                 Review of Systems    Allergies  Codeine  Home Medications   Prior to Admission medications   Medication Sig Start Date End Date Taking? Authorizing Provider  ALPRAZolam Duanne Moron)  0.5 MG tablet Take 0.5 mg by mouth at bedtime.   Yes Historical Provider, MD  aspirin EC 81 MG tablet Take 162 mg by mouth daily.  11/18/12  Yes Jessica A Hope, PA-C  escitalopram (LEXAPRO) 20 MG tablet Take 20 mg by mouth daily.   Yes Historical Provider, MD  losartan (COZAAR) 50 MG tablet Take 1 tablet (50 mg total) by mouth daily. 04/27/14  Yes Rowe Clack, MD  nitroGLYCERIN (NITROSTAT) 0.4 MG SL tablet Place 1 tablet (0.4 mg total) under the tongue every 5 (five) minutes as needed for chest pain. 11/09/12  Yes Liliane Shi, PA-C  Probiotic Product (PROBIOTIC ACIDOPHILUS) CAPS Take 1 tablet by mouth daily as needed (stomach pain).    Yes Historical Provider, MD  simvastatin (ZOCOR) 40 MG tablet Take 1 tablet (40 mg total) by mouth at bedtime. 04/27/14  Yes Rowe Clack, MD   BP 123/56  Pulse 63  Temp(Src) 98 F (36.7 C) (Oral)  Resp 14  Wt 128 lb 6.4 oz (58.242 kg)  SpO2 96% Physical Exam  Nursing note  and vitals reviewed. Constitutional: She is oriented to person, place, and time. She appears well-developed and well-nourished. No distress.  HENT:  Head: Normocephalic and atraumatic.  Nose: Nose normal.  Mouth/Throat: Oropharynx is clear and moist. No oropharyngeal exudate.  Eyes: Conjunctivae and EOM are normal. Pupils are equal, round, and reactive to light. No scleral icterus.  Neck: Normal range of motion. Neck supple. No JVD present. No tracheal deviation present. No thyromegaly present.  Cardiovascular: Normal rate, regular rhythm and normal heart sounds.  Exam reveals no gallop and no friction rub.   No murmur heard. Pulmonary/Chest: Effort normal and breath sounds normal. No respiratory distress. She has no wheezes. She exhibits no tenderness.  Abdominal: Soft. Bowel sounds are normal. She exhibits no distension and no mass. There is tenderness. There is no rebound and no guarding.  Diffuse tenderness to palpation. This is worse in the left lower quadrant.  Musculoskeletal: Normal range of motion. She exhibits no edema and no tenderness.  Lymphadenopathy:    She has no cervical adenopathy.  Neurological: She is alert and oriented to person, place, and time.  Skin: Skin is warm and dry. No rash noted. No erythema. No pallor.    ED Course  Procedures (including critical care time) Labs Review Labs Reviewed  CBC WITH DIFFERENTIAL - Abnormal; Notable for the following:    HCT 35.6 (*)    All other components within normal limits  COMPREHENSIVE METABOLIC PANEL - Abnormal; Notable for the following:    Glucose, Bld 109 (*)    GFR calc non Af Amer 82 (*)    All other components within normal limits  LIPASE, BLOOD  URINALYSIS, ROUTINE W REFLEX MICROSCOPIC    Imaging Review Ct Abdomen Pelvis W Contrast  06/29/2014   CLINICAL DATA:  Left lower quadrant abdominal pain and nausea  EXAM: CT ABDOMEN AND PELVIS WITH CONTRAST  TECHNIQUE: Multidetector CT imaging of the abdomen and  pelvis was performed using the standard protocol following bolus administration of intravenous contrast.  CONTRAST:  136mL OMNIPAQUE IOHEXOL 300 MG/ML  SOLN  COMPARISON:  CT of the chest performed 08/21/2010  FINDINGS: Mild bibasilar atelectasis is noted. Diffuse coronary artery calcifications are seen.  A small amount of ascites is noted tracking about the liver.  There is slight prominence of the intrahepatic biliary ducts, though this is difficult to fully assess given apparent mild periportal edema. A 1.2  cm hypodensity within the right hepatic lobe likely reflects a cyst. The gallbladder is borderline prominent, without definite evidence of stones. The pancreas and adrenal glands are grossly unremarkable in appearance.  Scattered small bilateral renal cysts are seen. The kidneys are otherwise unremarkable. Minimal left-sided perinephric stranding is seen. There is no evidence of hydronephrosis. No renal or ureteral stones are seen.  The proximal small bowel is unremarkable in appearance. The stomach is within normal limits. No acute vascular abnormalities are seen. Mild scattered calcification is seen at the distal abdominal aorta and its branches.  Cecal volvulus is noted, with a prominent appearance to the cecum, which is filled with stool and air. Surrounding soft tissue inflammation and trace free fluid is seen. There is inflammation with regard to the bowel both proximal and distal to the volvulus, particularly along the ascending colon.  There is associated partial fecalization of the distal ileum. The appendix is not definitely characterized.  The more distal colon contains a small amount of stool and is grossly unremarkable.  The bladder is mildly distended and grossly unremarkable. The patient is status post hysterectomy. No suspicious adnexal masses are seen. No inguinal lymphadenopathy is seen.  No acute osseous abnormalities are identified. Facet disease is noted at the lower lumbar spine.   IMPRESSION: 1. Cecal volvulus noted, with surrounding soft tissue inflammation and trace free fluid. The bowel both proximal and distal to the volvulus demonstrates inflammation, particularly along the ascending colon. Fecalization of the distal ileum. 2. Small amount of ascites noted tracking about the liver. 3. Slight prominence of intrahepatic biliary ducts, not well assessed given apparent mild periportal edema. Given normal LFTs, this likely remains within normal limits. 4. Diffuse coronary artery calcifications seen. 5. Mild scattered calcification along the distal abdominal aorta and its branches. 6. Mild bibasilar atelectasis noted. 7. Likely hepatic cysts noted. Scattered small bilateral renal cysts seen.  These results were called by telephone at the time of interpretation on 06/29/2014 at 5:52 am to Dr. Everlene Balls, who verbally acknowledged these results.   Electronically Signed   By: Garald Balding M.D.   On: 06/29/2014 06:01     EKG Interpretation   Date/Time:  Friday June 29 2014 03:27:18 EDT Ventricular Rate:  64 PR Interval:  176 QRS Duration: 105 QT Interval:  460 QTC Calculation: 475 R Axis:   -50 Text Interpretation:  Sinus rhythm Left anterior fascicular block Low  voltage, precordial leads Probable anteroseptal infarct, old Baseline  wander in lead(s) V6 No significant change since last tracing Confirmed by  Glynn Octave [phone]) on 06/29/2014 6:07:08 AM      MDM   Final diagnoses:  None    Patient presents emergency department out of concern for abdominal pain. Other studies were normal, she continues to be tender thus a CT scan was performed. This reveals a cecal volvulus with soft tissue inflammation and free fluid. General surgery consultation was placed. Awaiting their call. Surgery will hydrate the patient, and recommended for a medicine admission and GI consultation. Assistance in medical management we'll treat a cecal volvulus. Awaiting page from Dr.  hospitalist    Everlene Balls, MD 06/29/14 [phone]

## 2014-06-29 NOTE — ED Notes (Signed)
MD at bedside. 

## 2014-06-29 NOTE — Anesthesia Postprocedure Evaluation (Signed)
  Anesthesia Post-op Note  Patient: Michele Burns  Procedure(s) Performed: Procedure(s): EXPLORATORY LAPAROTOMY (N/A) PARTIAL COLECTOMY (N/A)  Patient Location: PACU  Anesthesia Type:General  Level of Consciousness: awake, alert  and oriented  Airway and Oxygen Therapy: Patient Spontanous Breathing  Post-op Pain: mild  Post-op Assessment: Post-op Vital signs reviewed  Post-op Vital Signs: Reviewed  Last Vitals:  Filed Vitals:   06/29/14 1503  BP: 115/61  Pulse: 62  Temp: 36.5 C  Resp: 14    Complications: No apparent anesthesia complications

## 2014-06-29 NOTE — ED Notes (Signed)
Pt reports pain beginning to return; MD aware.

## 2014-06-29 NOTE — Anesthesia Procedure Notes (Signed)
Procedure Name: Intubation Date/Time: 06/29/2014 9:46 AM Performed by: Neldon Newport Pre-anesthesia Checklist: Timeout performed, Patient identified, Emergency Drugs available, Suction available and Patient being monitored Patient Re-evaluated:Patient Re-evaluated prior to inductionOxygen Delivery Method: Circle system utilized Preoxygenation: Pre-oxygenation with 100% oxygen Intubation Type: IV induction Ventilation: Mask ventilation without difficulty Laryngoscope Size: Mac and 3 Grade View: Grade II Tube type: Oral Tube size: 7.5 mm Number of attempts: 1 Placement Confirmation: positive ETCO2 and breath sounds checked- equal and bilateral Secured at: 21 cm Tube secured with: Tape Dental Injury: Teeth and Oropharynx as per pre-operative assessment

## 2014-06-29 NOTE — Transfer of Care (Signed)
Immediate Anesthesia Transfer of Care Note  Patient: Michele Burns  Procedure(s) Performed: Procedure(s): EXPLORATORY LAPAROTOMY (N/A) PARTIAL COLECTOMY (N/A)  Patient Location: PACU  Anesthesia Type:General  Level of Consciousness: awake, alert  and oriented  Airway & Oxygen Therapy: Patient Spontanous Breathing and Patient connected to nasal cannula oxygen  Post-op Assessment: Report given to PACU RN, Post -op Vital signs reviewed and stable and Patient moving all extremities X 4  Post vital signs: Reviewed and stable  Complications: No apparent anesthesia complications

## 2014-06-29 NOTE — Op Note (Signed)
    Right hemicolectomy   Indications: This patient presents with abdominal pain and imaging consistent with cecal volvulus.    Pre-operative Diagnosis: cecal volvulus   Post-operative Diagnosis: Same  Surgeon: Stark Klein   Assistants: Leslie Andrea, SAFA  Anesthesia: General endotracheal anesthesia   ASA Class: 3 E  Procedure Details  The patient was seen in the Emergency department.  The risks, benefits, complications, treatment options, and expected outcomes were discussed with the patient. The possibilities of reaction to medication, perforation of viscus, bleeding, recurrent infection, finding a normal colon, the need for additional procedures, failure to diagnose a condition, and creating a complication requiring transfusion or operation were discussed with the patient. The patient was advised of the risk of ostomy. The patient concurred with the proposed plan, giving informed consent. The patient was taken to the operating room, identified, and the procedure verified as ex lap, possible colectomy, possible ostomy.   A Time Out was held and the above information confirmed.    After induction of a general anesthetic, a Foley catheter was inserted and the abdomen was prepped and draped in standard fashion.The midline was incised with a #10 blade.  The subcutaneous tissues were divided with the cautery.  The fascia and peritoneum was entered sharply in the upper abdomen.  The fascia was divided the length of the incision.  The cecal volvulus was seen immediately.  It was detorsed, but the base was held by a band of omentum.  This was divided with the ligasure.  The cecum was quite dilated, and the ascending colon was only held to the retroperitoneum at the hepatic flexure.     The hepatic flexure was then mobilized with gentle retraction of the colon in a medial direction with mobilization of the peritoneal reflection with cautery and the Ligasure. Mobilization of this area was  complete to expose the retroperitoneum. There was minimal blood loss during this portion of the procedure. The duodenum was avoided. The omentum was taken off the middle and proximal transverse colon with the ligasure.    A protractor was placed to protect the skin, and the colon was delivered through the incision. The terminal ileum and colon was resected with a linear stapling device proximal and distal to the area in question in regard to the specimen. The mesenteric vessels were divided with the Ligasure. The specimen was submitted to pathology.   A side to side, functional end-to-end anastomosis was performed through the small anterior incision with the linear stapling device. The mucosa was inspected and found to be hemostatic. Closure was achieved with the transverse stapling device. A 3-0 Vicryl suture was used to reapproximate the angle of the anastomosis. Hemostasis was confirmed. The mesenteric defect was closed with 2-0 running Vicryl suture. The bowel anastomosis was returned.   The fascial incision was then closed with two running looped #1 PDS sutures. The soft tissue was irrigated and the incision was loosely closed with staples and telfa wicks.    Instrument, sponge, and needle counts were correct prior to abdominal closure and at the conclusion of the case.   Findings: dusky right colon   Estimated Blood Loss: Minimal   Specimens: R colon   Complications: None; patient tolerated the procedure well.   Disposition: PACU - hemodynamically stable.   Condition: stable

## 2014-06-29 NOTE — H&P (Signed)
Seen, agree with above.  Plan ex lap for cecal volvulus with probable right colectomy vs colopexy.    Discussed risks/benefits with patient.    Will proceed.  Pt asked [place] to call son after surgery.

## 2014-06-29 NOTE — ED Notes (Signed)
Valuables and pocketbook locked up with security. Clothes sent with pt. Consent form signed.

## 2014-06-29 NOTE — H&P (Signed)
Michele Burns 1935-12-23  [id number].   Primary Care MD: Dr. Gwendolyn Grant  Chief Complaint/Reason for Consult: cecal volvulus HPI: This is a 78 yo white female who appears quite healthy, but has a history of CAD s/p stenting 2 years ago.  She is no longer on Plavix.  She also has HTN.  She has been in her normal state of health until yesterday around midday when she began having severe mid abdominal pain.  She denies nausea or vomiting.  She has not been moving her bowels for the last 2-3 days which is unusual for her.  Due to persistent pain over night, she presented to [facility] for evaluation.  She had a CT scan that revealed a cecal volvulus.  Her labs are all normal.  We have been asked to see her for admission.  ROS : Please see HPI, otherwise all other systems have currently been reviewed and are negative.  Family History  Problem Relation Age of Onset  . Heart attack Father 66    deceased  . Heart attack Mother 71    deceased  . Diabetes type I Mother   . Diabetes Mother   . Heart attack Brother 13    deceased  . Diabetes Paternal Grandmother     Past Medical History  Diagnosis Date  . Hx of colonic polyp 2006    diminutive '06, no polyps on 4/09 colo - for repeat 4/14  . IBS (irritable bowel syndrome)   . Erosive gastritis 2006    NSAID precipitated, resolved on f/u EGD  . MIGRAINE HEADACHE   . PULMONARY NODULE   . PVD     normal ABI/TBI 09/2009, chronic L toe pain and vasospasm (Raynauds)  . POSTMENOPAUSAL SYNDROME   . RENAL CYST   . Hypertension   . Hyperlipidemia   . DEPRESSION   . Coronary artery disease     a. Cath 11/17/12 - severe Ca++ moderately severe disease in the mid RCA, otherwise nonobstructive disease - s/p PTCA/rotational atherectomy & 2 DES to mid RCA 11/22/12.  . Sinus bradycardia     a. not on BB due to this.    Past Surgical History  Procedure Laterality Date  . Tonsillectomy      As a child  . Vaginal hysterectomy    . Excision morton's  neuroma  1971    L 3/4 toe space  . Artherectomy  11/22/2012    RCA  . Abdominal hysterectomy    . Tonsillectomy    . Esophagogastroduodenoscopy    . Colonoscopy    . Ptca      Social History:  reports that she quit smoking about 33 years ago. Her smoking use included Cigarettes. She smoked 0.00 packs per day for 30 years. She has never used smokeless tobacco. She reports that she drinks about 6 ounces of alcohol per week. She reports that she does not use illicit drugs.  Allergies:  Allergies  Allergen Reactions  . Codeine     REACTION: severe nausea     (Not in a hospital admission)  Blood pressure 124/56, pulse 56, temperature 98.5 F (36.9 C), temperature source Oral, resp. rate 12, weight 128 lb 6.4 oz (58.242 kg), SpO2 97.00%. Physical Exam: General: pleasant, WD, WN white female who is laying in bed in NAD HEENT: head is normocephalic, atraumatic.  Sclera are noninjected.  PERRL.  Ears and nose without any masses or lesions.  Mouth is pink and moist Heart: regular, rate, and rhythm.  Normal  s1,s2. No obvious murmurs, gallops, or rubs noted.  Palpable radial and pedal pulses bilaterally Lungs: CTAB, no wheezes, rhonchi, or rales noted.  Respiratory effort nonlabored Abd: soft, tender in RLQ and mid abdomen with palpable colon from distention, +BS, no masses, hernias, or organomegaly MS: all 4 extremities are symmetrical with no cyanosis, clubbing, or edema. Skin: warm and dry with no masses, lesions, or rashes Psych: A&Ox3 with an appropriate affect.    Results for orders placed during the hospital encounter of 06/29/14 (from the past 48 hour(s))  CBC WITH DIFFERENTIAL     Status: Abnormal   Collection Time    06/29/14  1:45 AM      Result Value Ref Range   WBC 5.4  4.0 - 10.5 K/uL   RBC 3.92  3.87 - 5.11 MIL/uL   Hemoglobin 12.2  12.0 - 15.0 g/dL   HCT 35.6 (*) 36.0 - 46.0 %   MCV 90.8  78.0 - 100.0 fL   MCH 31.1  26.0 - 34.0 pg   MCHC 34.3  30.0 - 36.0 g/dL    RDW 12.8  11.5 - 15.5 %   Platelets 196  150 - 400 K/uL   Neutrophils Relative % 67  43 - 77 %   Neutro Abs 3.6  1.7 - 7.7 K/uL   Lymphocytes Relative 22  12 - 46 %   Lymphs Abs 1.2  0.7 - 4.0 K/uL   Monocytes Relative 8  3 - 12 %   Monocytes Absolute 0.4  0.1 - 1.0 K/uL   Eosinophils Relative 3  0 - 5 %   Eosinophils Absolute 0.2  0.0 - 0.7 K/uL   Basophils Relative 0  0 - 1 %   Basophils Absolute 0.0  0.0 - 0.1 K/uL  COMPREHENSIVE METABOLIC PANEL     Status: Abnormal   Collection Time    06/29/14  1:45 AM      Result Value Ref Range   Sodium 142  137 - 147 mEq/L   Potassium 3.9  3.7 - 5.3 mEq/L   Chloride 102  96 - 112 mEq/L   CO2 26  19 - 32 mEq/L   Glucose, Bld 109 (*) 70 - 99 mg/dL   BUN 19  6 - 23 mg/dL   Creatinine, Ser 0.71  0.50 - 1.10 mg/dL   Calcium 9.4  8.4 - 10.5 mg/dL   Total Protein 6.9  6.0 - 8.3 g/dL   Albumin 4.0  3.5 - 5.2 g/dL   AST 26  0 - 37 U/L   ALT 26  0 - 35 U/L   Alkaline Phosphatase 66  39 - 117 U/L   Total Bilirubin 0.4  0.3 - 1.2 mg/dL   GFR calc non Af Amer 82 (*) >90 mL/min   GFR calc Af Amer >90  >90 mL/min   Comment: (NOTE)     The eGFR has been calculated using the CKD EPI equation.     This calculation has not been validated in all clinical situations.     eGFR's persistently <90 mL/min signify possible Chronic Kidney     Disease.   Anion gap 14  5 - 15  LIPASE, BLOOD     Status: None   Collection Time    06/29/14  1:45 AM      Result Value Ref Range   Lipase 31  11 - 59 U/L   Ct Abdomen Pelvis W Contrast  06/29/2014   CLINICAL DATA:  Left lower quadrant  abdominal pain and nausea  EXAM: CT ABDOMEN AND PELVIS WITH CONTRAST  TECHNIQUE: Multidetector CT imaging of the abdomen and pelvis was performed using the standard protocol following bolus administration of intravenous contrast.  CONTRAST:  129m OMNIPAQUE IOHEXOL 300 MG/ML  SOLN  COMPARISON:  CT of the chest performed 08/21/2010  FINDINGS: Mild bibasilar atelectasis is noted. Diffuse  coronary artery calcifications are seen.  A small amount of ascites is noted tracking about the liver.  There is slight prominence of the intrahepatic biliary ducts, though this is difficult to fully assess given apparent mild periportal edema. A 1.2 cm hypodensity within the right hepatic lobe likely reflects a cyst. The gallbladder is borderline prominent, without definite evidence of stones. The pancreas and adrenal glands are grossly unremarkable in appearance.  Scattered small bilateral renal cysts are seen. The kidneys are otherwise unremarkable. Minimal left-sided perinephric stranding is seen. There is no evidence of hydronephrosis. No renal or ureteral stones are seen.  The proximal small bowel is unremarkable in appearance. The stomach is within normal limits. No acute vascular abnormalities are seen. Mild scattered calcification is seen at the distal abdominal aorta and its branches.  Cecal volvulus is noted, with a prominent appearance to the cecum, which is filled with stool and air. Surrounding soft tissue inflammation and trace free fluid is seen. There is inflammation with regard to the bowel both proximal and distal to the volvulus, particularly along the ascending colon.  There is associated partial fecalization of the distal ileum. The appendix is not definitely characterized.  The more distal colon contains a small amount of stool and is grossly unremarkable.  The bladder is mildly distended and grossly unremarkable. The patient is status post hysterectomy. No suspicious adnexal masses are seen. No inguinal lymphadenopathy is seen.  No acute osseous abnormalities are identified. Facet disease is noted at the lower lumbar spine.  IMPRESSION: 1. Cecal volvulus noted, with surrounding soft tissue inflammation and trace free fluid. The bowel both proximal and distal to the volvulus demonstrates inflammation, particularly along the ascending colon. Fecalization of the distal ileum. 2. Small amount of  ascites noted tracking about the liver. 3. Slight prominence of intrahepatic biliary ducts, not well assessed given apparent mild periportal edema. Given normal LFTs, this likely remains within normal limits. 4. Diffuse coronary artery calcifications seen. 5. Mild scattered calcification along the distal abdominal aorta and its branches. 6. Mild bibasilar atelectasis noted. 7. Likely hepatic cysts noted. Scattered small bilateral renal cysts seen.  These results were called by telephone at the time of interpretation on 06/29/2014 at 5:52 am to Dr. AEverlene Balls who verbally acknowledged these results.   Electronically Signed   By: JGarald BaldingM.D.   On: 06/29/2014 06:01       Assessment/Plan 1. Cecal volvulus 2. CAD 3. PVD 4. HTN 5. Bradycardia  Plan: 1. We will get the patient admitted and take her to the operating room where she will undergo surgery for her cecal volvulus.  We have discussed possible bowel resection and ostomy as well as other risks and complications such as bleeding, infection, open wound, post operative abscess, etc.  The patient understands and is agreeable to proceed.  She will remain NPO.  Post op recovery has been discussed with her as well.  Cefotetan on call to OR.  Stellarose Cerny E 06/29/2014, 8:28 AM Pager: [phone]

## 2014-06-29 NOTE — Anesthesia Preprocedure Evaluation (Addendum)
Anesthesia Evaluation  Patient identified by MRN, date of birth, ID band Patient awake    Reviewed: Allergy & Precautions, H&P , NPO status , Patient's Chart, lab work & pertinent test results, reviewed documented beta blocker date and time   Airway Mallampati: III TM Distance: >3 FB Neck ROM: Full    Dental  (+) Teeth Intact, Dental Advisory Given   Pulmonary former smoker,  breath sounds clear to auscultation        Cardiovascular Exercise Tolerance: Good hypertension, + angina at rest + CAD, + Cardiac Stents (x 2 DES to RCA in 11/2012) and + Peripheral Vascular Disease Rhythm:Regular Rate:Normal     Neuro/Psych  Headaches, Depression    GI/Hepatic Neg liver ROS, PUD,   Endo/Other    Renal/GU negative Renal ROS     Musculoskeletal negative musculoskeletal ROS (+)   Abdominal   Peds  Hematology negative hematology ROS (+)   Anesthesia Other Findings 2010 TTE:   - Left ventricle: The cavity size was normal. Wall thickness was normal. Systolic function was normal. The estimated ejection fraction was in the range of 55% to 60%. Wall motion was normal; there were no regional wall motion abnormalities. Doppler parameters are consistent with abnormal left ventricular relaxation (grade 1 diastolic dysfunction). - Mitral valve: Mild regurgitation.  - Left atrium: The atrium was mildly dilated.  - Atrial septum: No defect or patent foramen ovale was identified  Reproductive/Obstetrics negative OB ROS                       Anesthesia Physical Anesthesia Plan  ASA: III and emergent  Anesthesia Plan: General   Post-op Pain Management:    Induction: Intravenous  Airway Management Planned: Oral ETT  Additional Equipment:   Intra-op Plan:   Post-operative Plan: Extubation in OR  Informed Consent: I have reviewed the patients History and Physical, chart, labs and discussed the procedure including the  risks, benefits and alternatives for the proposed anesthesia with the patient or authorized representative who has indicated his/her understanding and acceptance.   Dental advisory given and Dental Advisory Given  Plan Discussed with: CRNA, Anesthesiologist and Surgeon  Anesthesia Plan Comments:        Anesthesia Quick Evaluation

## 2014-06-29 NOTE — ED Notes (Signed)
Pharmacy called for antibiotic.  

## 2014-06-30 MED ORDER — PANTOPRAZOLE SODIUM 40 MG IV SOLR
40.0000 mg | Freq: Two times a day (BID) | INTRAVENOUS | Status: DC
  Administered 2014-06-30 – 2014-07-03 (×8): 40 mg via INTRAVENOUS
  Filled 2014-06-30 (×11): qty 40

## 2014-06-30 MED ORDER — HYDROMORPHONE HCL 1 MG/ML IJ SOLN
0.5000 mg | INTRAMUSCULAR | Status: DC | PRN
  Filled 2014-06-30: qty 1

## 2014-06-30 MED ORDER — KETOROLAC TROMETHAMINE 15 MG/ML IJ SOLN
15.0000 mg | Freq: Four times a day (QID) | INTRAMUSCULAR | Status: AC | PRN
  Administered 2014-06-30 – 2014-07-01 (×4): 15 mg via INTRAVENOUS
  Filled 2014-06-30 (×4): qty 1

## 2014-06-30 NOTE — Progress Notes (Signed)
1 Day Post-Op  Subjective: Stable and alert. Vital signs stable. Good urine output. Getting out of bed without supervision. A little hyperactive but pleasant and cooperative. I explained the operative findings and procedures to her. I explained the importance of ambulation and incentive spirometry. She thinks she's getting too many drugs.  Objective: Vital signs in last 24 hours: Temp:  [97.5 F (36.4 C)-98.2 F (36.8 C)] 97.9 F (36.6 C) (09/19 1020) Pulse Rate:  [58-66] 65 (09/19 1020) Resp:  [10-19] 12 (09/19 1020) BP: (101-149)/(51-70) 101/51 mmHg (09/19 1020) SpO2:  [91 %-100 %] 97 % (09/19 1020) Last BM Date: 06/29/14  Intake/Output from previous day: 09/18 0701 - 09/19 0700 In: 2735 [I.V.:2735] Out: 1450 [Urine:1375; Blood:75] Intake/Output this shift:    General appearance: alert Cooperative. A little hyperactive. Rambling conversation. Pleasant. No distress Resp: clear to auscultation bilaterally GI: ssoft. Silent. Appropriately tender. Wound clean.  Lab Results:  No results found for this or any previous visit (from the past 24 hour(s)).   Studies/Results: Ct Abdomen Pelvis W Contrast  06/29/2014   CLINICAL DATA:  Left lower quadrant abdominal pain and nausea  EXAM: CT ABDOMEN AND PELVIS WITH CONTRAST  TECHNIQUE: Multidetector CT imaging of the abdomen and pelvis was performed using the standard protocol following bolus administration of intravenous contrast.  CONTRAST:  173mL OMNIPAQUE IOHEXOL 300 MG/ML  SOLN  COMPARISON:  CT of the chest performed 08/21/2010  FINDINGS: Mild bibasilar atelectasis is noted. Diffuse coronary artery calcifications are seen.  A small amount of ascites is noted tracking about the liver.  There is slight prominence of the intrahepatic biliary ducts, though this is difficult to fully assess given apparent mild periportal edema. A 1.2 cm hypodensity within the right hepatic lobe likely reflects a cyst. The gallbladder is borderline prominent,  without definite evidence of stones. The pancreas and adrenal glands are grossly unremarkable in appearance.  Scattered small bilateral renal cysts are seen. The kidneys are otherwise unremarkable. Minimal left-sided perinephric stranding is seen. There is no evidence of hydronephrosis. No renal or ureteral stones are seen.  The proximal small bowel is unremarkable in appearance. The stomach is within normal limits. No acute vascular abnormalities are seen. Mild scattered calcification is seen at the distal abdominal aorta and its branches.  Cecal volvulus is noted, with a prominent appearance to the cecum, which is filled with stool and air. Surrounding soft tissue inflammation and trace free fluid is seen. There is inflammation with regard to the bowel both proximal and distal to the volvulus, particularly along the ascending colon.  There is associated partial fecalization of the distal ileum. The appendix is not definitely characterized.  The more distal colon contains a small amount of stool and is grossly unremarkable.  The bladder is mildly distended and grossly unremarkable. The patient is status post hysterectomy. No suspicious adnexal masses are seen. No inguinal lymphadenopathy is seen.  No acute osseous abnormalities are identified. Facet disease is noted at the lower lumbar spine.  IMPRESSION: 1. Cecal volvulus noted, with surrounding soft tissue inflammation and trace free fluid. The bowel both proximal and distal to the volvulus demonstrates inflammation, particularly along the ascending colon. Fecalization of the distal ileum. 2. Small amount of ascites noted tracking about the liver. 3. Slight prominence of intrahepatic biliary ducts, not well assessed given apparent mild periportal edema. Given normal LFTs, this likely remains within normal limits. 4. Diffuse coronary artery calcifications seen. 5. Mild scattered calcification along the distal abdominal aorta and its branches.  6. Mild bibasilar  atelectasis noted. 7. Likely hepatic cysts noted. Scattered small bilateral renal cysts seen.  These results were called by telephone at the time of interpretation on 06/29/2014 at 5:52 am to Dr. Everlene Balls, who verbally acknowledged these results.   Electronically Signed   By: Garald Balding M.D.   On: 06/29/2014 06:01    . ALPRAZolam  0.5 mg Oral QHS  . escitalopram  20 mg Oral Daily  . losartan  50 mg Oral Daily  . morphine   Intravenous 6 times per day  .  morphine injection  4 mg Intravenous Once     Assessment/Plan: s/p Procedure(s): EXPLORATORY LAPAROTOMY PARTIAL COLECTOMY  POD#1 -Emergent right colectomy for cecal volvulus N.p.o. Except ice chips Ambulate Incentive spirometry  Discontinue morphine PCA. Substitute toradol, Dilaudid as backup. bmet tomorrow to check renal fctn (normal preop)  History of erosive gastritis. Start Protonix  Coronary artery disease. Catheterization 11/17/2012. PTCA and atherectomy and 2 drug-eluting stents every 11 2014.  Hypertension  Depression   @PROBHOSP @  LOS: 1 day    Rahul Malinak M 06/30/2014  . .prob

## 2014-07-01 LAB — CBC
HCT: 27.7 % — ABNORMAL LOW (ref 36.0–46.0)
HEMATOCRIT: 29.6 % — AB (ref 36.0–46.0)
HEMOGLOBIN: 9.6 g/dL — AB (ref 12.0–15.0)
Hemoglobin: 10.2 g/dL — ABNORMAL LOW (ref 12.0–15.0)
MCH: 31.7 pg (ref 26.0–34.0)
MCH: 31.8 pg (ref 26.0–34.0)
MCHC: 34.7 g/dL (ref 30.0–36.0)
MCHC: 34.5 g/dL (ref 30.0–36.0)
MCV: 91.4 fL (ref 78.0–100.0)
MCV: 92.2 fL (ref 78.0–100.0)
PLATELETS: 127 10*3/uL — AB (ref 150–400)
Platelets: 144 10*3/uL — ABNORMAL LOW (ref 150–400)
RBC: 3.03 MIL/uL — ABNORMAL LOW (ref 3.87–5.11)
RBC: 3.21 MIL/uL — ABNORMAL LOW (ref 3.87–5.11)
RDW: 13 % (ref 11.5–15.5)
RDW: 12.9 % (ref 11.5–15.5)
WBC: 3.5 10*3/uL — ABNORMAL LOW (ref 4.0–10.5)
WBC: 4 10*3/uL (ref 4.0–10.5)

## 2014-07-01 LAB — BASIC METABOLIC PANEL
Anion gap: 7 (ref 5–15)
BUN: 13 mg/dL (ref 6–23)
CALCIUM: 8.4 mg/dL (ref 8.4–10.5)
CO2: 28 meq/L (ref 19–32)
CREATININE: 0.83 mg/dL (ref 0.50–1.10)
Chloride: 107 mEq/L (ref 96–112)
GFR calc Af Amer: 77 mL/min — ABNORMAL LOW (ref >90)
GFR calc non Af Amer: 67 mL/min — ABNORMAL LOW (ref >90)
GLUCOSE: 100 mg/dL — AB (ref 70–99)
Potassium: 4.6 mEq/L (ref 3.7–5.3)
Sodium: 142 mEq/L (ref 137–147)

## 2014-07-01 MED ORDER — INFLUENZA VAC SPLIT QUAD 0.5 ML IM SUSY
0.5000 mL | PREFILLED_SYRINGE | INTRAMUSCULAR | Status: AC
  Filled 2014-07-01: qty 0.5

## 2014-07-01 NOTE — Progress Notes (Signed)
2 Days Post-Op  Subjective: She feels better. Using less narcotic. Feels more clearheaded and oriented. Denies nausea. Once the drink liquids. No stool or flatus yet. Potassium 4.6. Creatinine 0.83. Glucose 100. Hemoglobin 9.6. WBC 3500.  Objective: Vital signs in last 24 hours: Temp:  [98 F (36.7 C)-99.2 F (37.3 C)] 98.7 F (37.1 C) (09/20 3545) Pulse Rate:  [58-70] 58 (09/20 0633) Resp:  [14-16] 16 (09/20 0633) BP: (95-121)/(57-70) 118/70 mmHg (09/20 0633) SpO2:  [93 %-95 %] 95 % (09/20 6256) Last BM Date: 06/29/14  Intake/Output from previous day: 09/19 0701 - 09/20 0700 In: 1033.3 [I.V.:1033.3] Out: 1925 [Urine:1925] Intake/Output this shift:    General appearance: much more oriented and cooperative. Mental status good. No distress. Resp: clear to auscultation bilaterally GI: abdomen reasonably soft. Bowel sounds present. Borderline distended. Wound looks good.  Lab Results:  Results for orders placed during the hospital encounter of 06/29/14 (from the past 24 hour(s))  CBC     Status: Abnormal   Collection Time    07/01/14  5:45 AM      Result Value Ref Range   WBC 3.5 (*) 4.0 - 10.5 K/uL   RBC 3.03 (*) 3.87 - 5.11 MIL/uL   Hemoglobin 9.6 (*) 12.0 - 15.0 g/dL   HCT 27.7 (*) 36.0 - 46.0 %   MCV 91.4  78.0 - 100.0 fL   MCH 31.7  26.0 - 34.0 pg   MCHC 34.7  30.0 - 36.0 g/dL   RDW 13.0  11.5 - 15.5 %   Platelets 127 (*) 150 - 400 K/uL  BASIC METABOLIC PANEL     Status: Abnormal   Collection Time    07/01/14  8:10 AM      Result Value Ref Range   Sodium 142  137 - 147 mEq/L   Potassium 4.6  3.7 - 5.3 mEq/L   Chloride 107  96 - 112 mEq/L   CO2 28  19 - 32 mEq/L   Glucose, Bld 100 (*) 70 - 99 mg/dL   BUN 13  6 - 23 mg/dL   Creatinine, Ser 0.83  0.50 - 1.10 mg/dL   Calcium 8.4  8.4 - 10.5 mg/dL   GFR calc non Af Amer 67 (*) >90 mL/min   GFR calc Af Amer 77 (*) >90 mL/min   Anion gap 7  5 - 15     Studies/Results: No results found.  . ALPRAZolam  0.5 mg  Oral QHS  . escitalopram  20 mg Oral Daily  . [START ON 07/02/2014] Influenza vac split quadrivalent PF  0.5 mL Intramuscular Tomorrow-1000  . losartan  50 mg Oral Daily  . pantoprazole (PROTONIX) IV  40 mg Intravenous Q12H     Assessment/Plan: s/p Procedure(s): EXPLORATORY LAPAROTOMY PARTIAL COLECTOMY   POD#2 -Emergent right colectomy for cecal volvulus  Start clear liqs. Ambulate  Incentive spirometry   toradol +/- morphine for pain seems better combination from cognitive standpoint  History of erosive gastritis.  OnProtonix   Coronary artery disease. Catheterization 11/17/2012. PTCA and atherectomy and 2 drug-eluting stents every 11 2014.   Hypertension   Depression    @PROBHOSP @  LOS: 2 days    Michele Burns M 07/01/2014  . .prob

## 2014-07-01 NOTE — Progress Notes (Signed)
While ambulating to bathroom, patient passed a large amount of black, jelly like blood from her rectum, with numerous clots. VS stable. Pt states she feels better than she did earlier in the day and was surprised by the amount of blood that passed. Notified Silvestre Gunner, PA-C. Order received.

## 2014-07-02 ENCOUNTER — Encounter (HOSPITAL_COMMUNITY): Payer: Self-pay | Admitting: General Practice

## 2014-07-02 LAB — CBC
HCT: 29.7 % — ABNORMAL LOW (ref 36.0–46.0)
Hemoglobin: 10.3 g/dL — ABNORMAL LOW (ref 12.0–15.0)
MCH: 31.4 pg (ref 26.0–34.0)
MCHC: 34.7 g/dL (ref 30.0–36.0)
MCV: 90.5 fL (ref 78.0–100.0)
PLATELETS: 155 10*3/uL (ref 150–400)
RBC: 3.28 MIL/uL — ABNORMAL LOW (ref 3.87–5.11)
RDW: 12.5 % (ref 11.5–15.5)
WBC: 4.5 10*3/uL (ref 4.0–10.5)

## 2014-07-02 MED ORDER — TRAMADOL HCL 50 MG PO TABS
50.0000 mg | ORAL_TABLET | Freq: Four times a day (QID) | ORAL | Status: DC | PRN
  Administered 2014-07-02 – 2014-07-03 (×3): 50 mg via ORAL
  Filled 2014-07-02 (×3): qty 1

## 2014-07-02 NOTE — Progress Notes (Signed)
Patient reports minimal flatus/ BM.  No sign of further bleeding. Unclear whether there was some miscommunication about passing blood - she thinks the blood clots were in her urine.  Will continue clears for now until better bowel function Abd binder Ambulate.  Imogene Burn. Georgette Dover, MD, [facility] Surgery  General/ Trauma Surgery  07/02/2014 11:15 AM

## 2014-07-02 NOTE — Progress Notes (Signed)
Patient ID: [place] JON, female   DOB: 1936/06/22, 78 y.o.   MRN: [id number] 3 Days Post-Op  Subjective: Pt c/o some bloody BMs with some old dark blood and intermittent bright red blood.  She is tolerating her clear liquids well.  Pain is controlled.    Objective: Vital signs in last 24 hours: Temp:  [97.8 F (36.6 C)-98.2 F (36.8 C)] 98.2 F (36.8 C) (09/21 0514) Pulse Rate:  [51-61] 61 (09/21 0514) Resp:  [16] 16 (09/21 0514) BP: (116-142)/(64-76) 127/76 mmHg (09/21 0514) SpO2:  [94 %-100 %] 100 % (09/21 0514) Last BM Date: 06/29/14  Intake/Output from previous day: 09/20 0701 - 09/21 0700 In: 990 [P.O.:240; I.V.:750] Out: 2000 [Urine:2000] Intake/Output this shift:    PE: Abd: soft, +BS, ND, appropriately tender, incision c/d/iw with honeycomb dressing and staples in place.  Honeycomb dressing with old drainage present Heart: regular Lungs: CTAB  Lab Results:   Recent Labs  07/01/14 0545 07/01/14 1605  WBC 3.5* 4.0  HGB 9.6* 10.2*  HCT 27.7* 29.6*  PLT 127* 144*   BMET  Recent Labs  07/01/14 0810  NA 142  K 4.6  CL 107  CO2 28  GLUCOSE 100*  BUN 13  CREATININE 0.83  CALCIUM 8.4   PT/INR No results found for this basename: LABPROT, INR,  in the last 72 hours CMP     Component Value Date/Time   NA 142 07/01/2014 0810   K 4.6 07/01/2014 0810   CL 107 07/01/2014 0810   CO2 28 07/01/2014 0810   GLUCOSE 100* 07/01/2014 0810   BUN 13 07/01/2014 0810   CREATININE 0.83 07/01/2014 0810   CALCIUM 8.4 07/01/2014 0810   PROT 6.9 06/29/2014 0145   ALBUMIN 4.0 06/29/2014 0145   AST 26 06/29/2014 0145   ALT 26 06/29/2014 0145   ALKPHOS 66 06/29/2014 0145   BILITOT 0.4 06/29/2014 0145   GFRNONAA 67* 07/01/2014 0810   GFRAA 77* 07/01/2014 0810   Lipase     Component Value Date/Time   LIPASE 31 06/29/2014 0145       Studies/Results: No results found.  Anti-infectives: Anti-infectives   Start     Dose/Rate Route Frequency Ordered Stop   06/29/14 0845   [MAR Hold]  cefoTEtan (CEFOTAN) 2 g in dextrose 5 % 50 mL IVPB     (On MAR Hold since 06/29/14 0915)   2 g 100 mL/hr over 30 Minutes Intravenous 60 min pre-op 06/29/14 0826 06/29/14 1000       Assessment/Plan  1. POD 3, s/p ileocecectomy for cecal volvulus 2. Blood per rectum  Plan: 1. Check hgb this am, but stable yesterday. 2. Cont to hold DVT prophylaxis 3. Keep on clears for now until bleeding stops 4. Try tramadol for pain control as patient is scared of taking narcotics given confusion she had with morphine on Friday. 5. Mobilize and pulm toilet   LOS: 3 days    Jeremie Giangrande E 07/02/2014, 10:43 AM Pager: [phone]

## 2014-07-03 LAB — BASIC METABOLIC PANEL
ANION GAP: 10 (ref 5–15)
BUN: 8 mg/dL (ref 6–23)
CHLORIDE: 101 meq/L (ref 96–112)
CO2: 27 mEq/L (ref 19–32)
Calcium: 8.8 mg/dL (ref 8.4–10.5)
Creatinine, Ser: 0.86 mg/dL (ref 0.50–1.10)
GFR calc Af Amer: 74 mL/min — ABNORMAL LOW (ref >90)
GFR, EST NON AFRICAN AMERICAN: 64 mL/min — AB (ref >90)
Glucose, Bld: 100 mg/dL — ABNORMAL HIGH (ref 70–99)
POTASSIUM: 4.7 meq/L (ref 3.7–5.3)
Sodium: 138 mEq/L (ref 137–147)

## 2014-07-03 LAB — CBC
HCT: 29.2 % — ABNORMAL LOW (ref 36.0–46.0)
Hemoglobin: 10.2 g/dL — ABNORMAL LOW (ref 12.0–15.0)
MCH: 31.2 pg (ref 26.0–34.0)
MCHC: 34.9 g/dL (ref 30.0–36.0)
MCV: 89.3 fL (ref 78.0–100.0)
PLATELETS: 177 10*3/uL (ref 150–400)
RBC: 3.27 MIL/uL — ABNORMAL LOW (ref 3.87–5.11)
RDW: 12.4 % (ref 11.5–15.5)
WBC: 4.8 10*3/uL (ref 4.0–10.5)

## 2014-07-03 MED ORDER — ENOXAPARIN SODIUM 30 MG/0.3ML ~~LOC~~ SOLN
30.0000 mg | Freq: Every day | SUBCUTANEOUS | Status: DC
  Administered 2014-07-03: 30 mg via SUBCUTANEOUS
  Filled 2014-07-03 (×2): qty 0.3

## 2014-07-03 MED ORDER — INFLUENZA VAC SPLIT QUAD 0.5 ML IM SUSY
0.5000 mL | PREFILLED_SYRINGE | INTRAMUSCULAR | Status: AC
  Administered 2014-07-04: 0.5 mL via INTRAMUSCULAR
  Filled 2014-07-03: qty 0.5

## 2014-07-03 NOTE — Progress Notes (Signed)
Patient ID: Michele Burns, female   DOB: 11-Jun-1936, 78 y.o.   MRN: [id number] 4 Days Post-Op  Subjective: Pt feels better today.  No further bleeding.  Tolerating clear liquids.  Passing flatus  Objective: Vital signs in last 24 hours: Temp:  [98.2 F (36.8 C)-98.3 F (36.8 C)] 98.2 F (36.8 C) (09/22 0535) Pulse Rate:  [54-58] 57 (09/22 0535) Resp:  [14-18] 14 (09/22 0535) BP: (125-148)/(71-78) 125/71 mmHg (09/22 0535) SpO2:  [95 %-100 %] 95 % (09/22 0535) Last BM Date: 06/29/14  Intake/Output from previous day: 09/21 0701 - 09/22 0700 In: 3551.3 [P.O.:720; I.V.:2831.3] Out: -  Intake/Output this shift: Total I/O In: 430 [P.O.:430] Out: 450 [Urine:450]  PE: Abd: soft, +BS, ND, appropriately tender, incision c/d/i with staples, old drainage noted on dressing is stable  Lab Results:   Recent Labs  07/02/14 1245 07/03/14 0426  WBC 4.5 4.8  HGB 10.3* 10.2*  HCT 29.7* 29.2*  PLT 155 177   BMET  Recent Labs  07/01/14 0810 07/03/14 0426  NA 142 138  K 4.6 4.7  CL 107 101  CO2 28 27  GLUCOSE 100* 100*  BUN 13 8  CREATININE 0.83 0.86  CALCIUM 8.4 8.8   PT/INR No results found for this basename: LABPROT, INR,  in the last 72 hours CMP     Component Value Date/Time   NA 138 07/03/2014 0426   K 4.7 07/03/2014 0426   CL 101 07/03/2014 0426   CO2 27 07/03/2014 0426   GLUCOSE 100* 07/03/2014 0426   BUN 8 07/03/2014 0426   CREATININE 0.86 07/03/2014 0426   CALCIUM 8.8 07/03/2014 0426   PROT 6.9 06/29/2014 0145   ALBUMIN 4.0 06/29/2014 0145   AST 26 06/29/2014 0145   ALT 26 06/29/2014 0145   ALKPHOS 66 06/29/2014 0145   BILITOT 0.4 06/29/2014 0145   GFRNONAA 64* 07/03/2014 0426   GFRAA 74* 07/03/2014 0426   Lipase     Component Value Date/Time   LIPASE 31 06/29/2014 0145       Studies/Results: No results found.  Anti-infectives: Anti-infectives   Start     Dose/Rate Route Frequency Ordered Stop   06/29/14 0845  [MAR Hold]  cefoTEtan (CEFOTAN) 2 g in  dextrose 5 % 50 mL IVPB     (On MAR Hold since 06/29/14 0915)   2 g 100 mL/hr over 30 Minutes Intravenous 60 min pre-op 06/29/14 0826 06/29/14 1000       Assessment/Plan  1. POD 4, s/p right colectomy for cecal volvulus 2. Post op bleeding, confusion as to urinary or in stool, but hgb stable with no further bleeding  Plan: 1. Will advance diet to full liquids today. 2. Add lovenox 30mg  subQ for DVT prophylaxis since hgb stable.  Cont SCDs 3. Mobilize and pulm toilet 4. May shower   LOS: 4 days    Raia Amico E 07/03/2014, 9:49 AM Pager: [phone]

## 2014-07-03 NOTE — Progress Notes (Signed)
Doing much better Advance diet Probably discharge home tomorrow.  Imogene Burn. Georgette Dover, MD, [facility]  General/ Trauma Surgery  07/03/2014 1:56 PM

## 2014-07-04 DIAGNOSIS — K562 Volvulus: Secondary | ICD-10-CM | POA: Diagnosis not present

## 2014-07-04 MED ORDER — TRAMADOL HCL 50 MG PO TABS: 50.0000 mg | ORAL_TABLET | Freq: Four times a day (QID) | ORAL | Status: DC | PRN

## 2014-07-04 NOTE — Discharge Summary (Signed)
[facility] Discharge Summary   Patient ID: Michele Burns MRN: [id number] DOB/AGE: 76-Jul-1937 76 y.o.  Admit date: 06/29/2014 Discharge date: 07/04/2014  Admitting Diagnosis: Cecal volvulus  Discharge Diagnosis Patient Active Problem List   Diagnosis Date Noted  . 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 Tremor   . Atherosclerosis of native arteries of the extremities with ulceration(440.23) 01/02/2014  . Angina at rest 11/18/2012  . CAD (coronary artery disease) 11/18/2012  . Increased liver enzymes 05/20/2011  . DEPRESSION 12/25/2010  . IRRITABLE BOWEL SYNDROME 10/08/2010  . PULMONARY NODULE 08/14/2010  . POSTMENOPAUSAL SYNDROME 02/11/2010  . PVD 12/27/2009  . MIGRAINE HEADACHE 11/12/2009  . CONSTIPATION 11/12/2009  . RENAL CYST 11/12/2009  . CARDIAC MURMUR 09/23/2009  . HYPERLIPIDEMIA 09/20/2009  . HYPERTENSION, UNSPECIFIED 09/20/2009  . ABNORMAL ELECTROCARDIOGRAM 09/20/2009    Consultants None  Imaging: No results found.  Procedures Dr. Barry Dienes (07/04/14) - Emergent right hemicolectomy  Hospital Course:  78 yo white female who appears quite healthy, but has a history of CAD s/p stenting 2 years ago. She is no longer on Plavix. She also has HTN. She has been in her normal state of health until yesterday around midday when she began having severe mid abdominal pain. She denies nausea or vomiting. She has not been moving her bowels for the last 2-3 days which is unusual for her. Due to persistent pain over night, she presented to [facility] for evaluation. She had a CT scan that revealed a cecal volvulus. Her labs are all normal. We have been asked to see her for admission.  Workup showed cecal volvulus.  Patient was admitted and underwent procedure listed above.  Tolerated procedure well and was transferred to the floor.  On POD #3 she thought she noticed blood in her urine, but this has resolved.  Her bowel function returned and diet was advanced as tolerated.  On POD  #5, the patient was voiding well, tolerating diet, ambulating well, pain well controlled, vital signs stable, incisions c/d/i and felt stable for discharge home.  Will remove wicks from wound and teach her to change her dressing daily for a few days until the drainage stops.  Patient will follow up in our office in 1 week for staple removal and in 2-3 weeks for a post op check with Dr. Barry Dienes and knows to call with questions or concerns.     Medication List         ALPRAZolam 0.5 MG tablet  Commonly known as:  XANAX  Take 0.5 mg by mouth at bedtime.     aspirin EC 81 MG tablet  Take 162 mg by mouth daily.     escitalopram 20 MG tablet  Commonly known as:  LEXAPRO  Take 20 mg by mouth daily.     losartan 50 MG tablet  Commonly known as:  COZAAR  Take 1 tablet (50 mg total) by mouth daily.     nitroGLYCERIN 0.4 MG SL tablet  Commonly known as:  NITROSTAT  Place 1 tablet (0.4 mg total) under the tongue every 5 (five) minutes as needed for chest pain.     PROBIOTIC ACIDOPHILUS Caps  Take 1 tablet by mouth daily as needed (stomach pain).     simvastatin 40 MG tablet  Commonly known as:  ZOCOR  Take 1 tablet (40 mg total) by mouth at bedtime.     traMADol 50 MG tablet  Commonly known as:  ULTRAM  Take 1 tablet (50 mg total) by mouth every  6 (six) hours as needed for moderate pain.         Follow-up Information   Follow up with [facility], MD. Schedule an appointment as soon as possible for a visit in 2 weeks. (For post-operation check in 2-3 weeks.)    Specialty:  General Surgery   Contact information:   [address] [place] [address] [phone]       Follow up with Jeralyn Ruths, CMA In 1 week. (For staple removal)    Specialty:  General Surgery   Contact information:   [address] [place] [address]       Signed: Excell Seltzer [facility] [phone]  07/04/2014, 9:56 AM

## 2014-07-04 NOTE — Discharge Instructions (Signed)
[facility]      [facility] [phone]  OPEN ABDOMINAL SURGERY: POST OP INSTRUCTIONS  Always review your discharge instruction sheet given to you by the facility where your surgery was performed.  IF YOU HAVE DISABILITY OR FAMILY LEAVE FORMS, YOU MUST BRING THEM TO THE OFFICE FOR PROCESSING.  PLEASE DO NOT GIVE THEM TO YOUR DOCTOR.  1. A prescription for pain medication may be given to you upon discharge.  Take your pain medication as prescribed, if needed.  If narcotic pain medicine is not needed, then you may take acetaminophen (Tylenol) or ibuprofen (Advil) as needed. 2. Take your usually prescribed medications unless otherwise directed. 3. If you need a refill on your pain medication, please contact your pharmacy. They will contact our office to request authorization.  Prescriptions will not be filled after 5pm or on week-ends. 4. You should follow a light diet the first few days after arrival home, such as soup and crackers, pudding, etc.unless your doctor has advised otherwise. A high-fiber, low fat diet can be resumed as tolerated.   Be sure to include lots of fluids daily. Most patients will experience some swelling and bruising on the chest and neck area.  Ice packs will help.  Swelling and bruising can take several days to resolve 5. Most patients will experience some swelling and bruising in the area of the incision. Ice pack will help. Swelling and bruising can take several days to resolve..  6. It is common to experience some constipation if taking pain medication after surgery.  Increasing fluid intake and taking a stool softener will usually help or prevent this problem from occurring.  A mild laxative (Milk of Magnesia or Miralax) should be taken according to package directions if there are no bowel movements after 48 hours. 7.  You may have steri-strips (small skin tapes) in place directly over the incision.  These strips should be left on the skin for 7-10 days.  If your  surgeon used skin glue on the incision, you may shower in 24 hours.  The glue will flake off over the next 2-3 weeks.  Any sutures or staples will be removed at the office during your follow-up visit. You may find that a light gauze bandage over your incision may keep your staples from being rubbed or pulled. You may shower and replace the bandage daily. 8. ACTIVITIES:  You may resume regular (light) daily activities beginning the next day--such as daily self-care, walking, climbing stairs--gradually increasing activities as tolerated.  You may have sexual intercourse when it is comfortable.  Refrain from any heavy lifting or straining until approved by your doctor. a. You may drive when you no longer are taking prescription pain medication, you can comfortably wear a seatbelt, and you can safely maneuver your car and apply brakes b. Return to Work: ___________________________________ 9. You should see your doctor in the office for a follow-up appointment approximately two weeks after your surgery.  Make sure that you call for this appointment within a day or two after you arrive home to insure a convenient appointment time. OTHER INSTRUCTIONS:  _____________________________________________________________ _____________________________________________________________  WHEN TO CALL YOUR DOCTOR: 1. Fever over 101.0 2. Inability to urinate 3. Nausea and/or vomiting 4. Extreme swelling or bruising 5. Continued bleeding from incision. 6. Increased pain, redness, or drainage from the incision. 7. Difficulty swallowing or breathing 8. Muscle cramping or spasms. 9. Numbness or tingling in hands or feet or around lips.  The clinic staff is available to   answer your questions during regular business hours.  Please don't hesitate to call and ask to speak to one of the nurses if you have concerns.  For further questions, please visit [email or web address]   

## 2014-07-04 NOTE — Progress Notes (Signed)
Discharge instructions gone over with patient. Home medications gone over. Prescription given. Follow up appointment to be made. Patient demonstrated how to change dry abdominal dressing. Diet, activity, and incisional care gone over. Signs and symptoms of infection gone over and who to call. Patient verbalized understanding of instructions.

## 2014-07-04 NOTE — Discharge Summary (Signed)
Doing well Ready for discharge.  Imogene Burn. Georgette Dover, MD, [facility]  General/ Trauma Surgery  07/04/2014 10:14 AM

## 2014-07-04 NOTE — Care Management Note (Signed)
  Page 1 of 1   07/04/2014     9:36:40 AM CARE MANAGEMENT NOTE 07/04/2014  Patient:  Michele Burns, Michele Burns   Account Number:  [id number]  Date Initiated:  07/03/2014  Documentation initiated by:  Magdalen Spatz  Subjective/Objective Assessment:     Action/Plan:   Anticipated DC Date:  07/04/2014   Anticipated DC Plan:  HOME/SELF CARE         Choice offered to / List presented to:             Status of service:   Medicare Important Message given?  NA - LOS <3 / Initial given by admissions (If response is "NO", the following Medicare IM given date fields will be blank) Date Medicare IM given:  07/02/2014 Medicare IM given by:   Date Additional Medicare IM given:   Additional Medicare IM given by:    Discharge Disposition:    Per UR Regulation:    If discussed at Long Length of Stay Meetings, dates discussed:    Comments:

## 2014-07-10 ENCOUNTER — Ambulatory Visit: Payer: Medicare Other | Admitting: Internal Medicine

## 2014-08-02 ENCOUNTER — Telehealth: Payer: Self-pay

## 2014-08-02 MED ORDER — ALPRAZOLAM 0.5 MG PO TABS: 0.5000 mg | ORAL_TABLET | Freq: Every day | ORAL | Status: DC

## 2014-08-02 NOTE — Telephone Encounter (Signed)
done

## 2014-08-02 NOTE — Telephone Encounter (Signed)
Refill request for Alprazolam 0.5 mg

## 2014-08-27 ENCOUNTER — Other Ambulatory Visit: Payer: Self-pay | Admitting: Internal Medicine

## 2014-08-28 NOTE — Telephone Encounter (Signed)
Done hardcopy to robin  

## 2014-08-28 NOTE — Telephone Encounter (Signed)
Faxed hardcopy for Alprazolam to [facility] [place]

## 2014-09-04 ENCOUNTER — Ambulatory Visit: Payer: Medicare Other | Admitting: Rehabilitative and Restorative Service Providers"

## 2014-09-17 ENCOUNTER — Ambulatory Visit: Payer: Medicare Other | Attending: General Surgery | Admitting: Rehabilitative and Restorative Service Providers"

## 2014-09-17 DIAGNOSIS — R42 Dizziness and giddiness: Secondary | ICD-10-CM | POA: Diagnosis present

## 2014-09-17 NOTE — Therapy (Signed)
[facility] [address] [place], [place], [address] Phone: [phone]   Fax:  [phone]  Physical Therapy Evaluation  Patient Details  Name: Michele Burns MRN: [id number] Date of Birth: 1935/11/10  Encounter Date: 09/17/2014      PT End of Session - 09/17/14 1221    Visit Number 1   Number of Visits 1   PT Start Time 1103   PT Stop Time 1142   PT Time Calculation (min) 39 min   Activity Tolerance Patient tolerated treatment well   Behavior During Therapy [facility] for tasks assessed/performed      Past Medical History  Diagnosis Date  . Hx of colonic polyp 2006    diminutive '06, no polyps on 4/09 colo - for repeat 4/14  . IBS (irritable bowel syndrome)   . Erosive gastritis 2006    NSAID precipitated, resolved on f/u EGD  . MIGRAINE HEADACHE   . PULMONARY NODULE   . PVD     normal ABI/TBI 09/2009, chronic L toe pain and vasospasm (Raynauds)  . POSTMENOPAUSAL SYNDROME   . RENAL CYST   . Hypertension   . Hyperlipidemia   . DEPRESSION   . Coronary artery disease     a. Cath 11/17/12 - severe Ca++ moderately severe disease in the mid RCA, otherwise nonobstructive disease - s/p PTCA/rotational atherectomy & 2 DES to mid RCA 11/22/12.  . Sinus bradycardia     a. not on BB due to this.    Past Surgical History  Procedure Laterality Date  . Tonsillectomy      As a child  . Vaginal hysterectomy    . Excision morton's neuroma  1971    L 3/4 toe space  . Artherectomy  11/22/2012    RCA  . Abdominal hysterectomy    . Tonsillectomy    . Esophagogastroduodenoscopy    . Colonoscopy    . Ptca    . Laparoscopic right hemi colectomy  06/29/2014  . Laparotomy N/A 06/29/2014    Procedure: EXPLORATORY LAPAROTOMY;  Surgeon: Stark Klein, MD;  Location: [place];  Service: General;  Laterality: N/A;  . Partial colectomy N/A 06/29/2014    Procedure: PARTIAL COLECTOMY;  Surgeon: Stark Klein, MD;  Location: [place];  Service: General;   Laterality: N/A;    There were no vitals taken for this visit.  Visit Diagnosis:  Dizziness and giddiness      Subjective Assessment - 09/17/14 1109    Symptoms The patient notes onset of dizziness and lightheadedness when she gets up quickly and during the night when walking to the bathroom.  She notes general unsteadiness upon rising that improves the more she is on her feet.   Patient Stated Goals Decrease lightheadedness.   Currently in Pain? No/denies          [facility] PT Assessment - 09/17/14 0001    Assessment   Medical Diagnosis --  dizziness   Onset Date --  06/2014   Balance Screen   Has the patient fallen in the past 6 months No   Has the patient had a decrease in activity level because of a fear of falling?  No   Is the patient reluctant to leave their home because of a fear of falling?  No   Home Environment   Living Enviornment Private residence   Living Arrangements Alone   Available Help at Discharge Family  family in area   Type of [place] to  enter   Entrance Stairs-Number of Steps --  6   Entrance Stairs-Rails Can reach both   Home Layout One level   Prior Function   Level of Independence --  Independent with all ADLs   Ambulation/Gait   Ambulation/Gait Yes   Ambulation/Gait Assistance 7: Independent   Ambulation Distance (Feet) --  full community ambulation   Gait Pattern Within Functional Limits   Gait velocity --  WNLs gait speed         PT Education - October 15, 2014 1221    Education provided Yes   Education Details postural hypotension handout   Person(s) Educated Patient   Methods Explanation;Demonstration;Handout   Comprehension Verbalized understanding          Plan - 15-Oct-2014 1221    Clinical Impression Statement The patient is a 78 yo female presenting to OP rehab with c/o lightheaded sensation following recent GI surgery.  At today's visit, she denies all vertigo sensation and instead reports subjective  symptoms more consistent with postural hypotension.  Her BP readings demonstrated an 18 point change on systolic and patient reports not drinking water regularly.  PT recommended she make a follow-up visit with her primary care MD to review meds and assess her symptoms since this has changed since recent surgical procedure.   Recommended Other Services f/u with primary MD-PT not indicated at this time.          G-Codes - 2014-10-15 1224    Functional Limitation Self care   Self Care Current Status [phone]) At least 1 percent but less than 20 percent impaired, limited or restricted   Self Care Goal Status (Z1245) At least 1 percent but less than 20 percent impaired, limited or restricted   Self Care Discharge Status [phone]) At least 1 percent but less than 20 percent impaired, limited or restricted          Vestibular Assessment - 10-15-14 1119    Type of Dizziness Lightheadedness   Frequency of Dizziness --  mainly with getting up quickly   Duration of Dizziness --  seconds   Aggravating Factors Sit to stand   BP supine (x 5 minutes) 130/68 mmHg   HR supine (x 5 minutes) 52  bpm   BP standing (after 1 minute) 112/68 mmHg   HR standing (after 1 minute) 63   BP standing (after 3 minutes) 118/64 mmHg   HR standing (after 3 minutes) 61                       Problem List Patient Active Problem List   Diagnosis Date Noted  . 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 Tremor   . Atherosclerosis of native arteries of the extremities with ulceration(440.23) 01/02/2014  . Angina at rest 11/18/2012  . CAD (coronary artery disease) 11/18/2012  . Increased liver enzymes 05/20/2011  . DEPRESSION 12/25/2010  . IRRITABLE BOWEL SYNDROME 10/08/2010  . PULMONARY NODULE 08/14/2010  . POSTMENOPAUSAL SYNDROME 02/11/2010  . PVD 12/27/2009  . MIGRAINE HEADACHE 11/12/2009  . CONSTIPATION 11/12/2009  . RENAL CYST 11/12/2009  . CARDIAC MURMUR 09/23/2009  . HYPERLIPIDEMIA 09/20/2009  .  HYPERTENSION, UNSPECIFIED 09/20/2009  . ABNORMAL ELECTROCARDIOGRAM 09/20/2009     Thank you for the referral of this patient.   Rudell Cobb, PT, MPT 2014/10/15 12:29 PM [facility] Phone: [phone] Fax: [phone]  [place] 2014/10/15, 12:28 PM

## 2014-09-17 NOTE — Patient Instructions (Signed)
Provided CDC [employer] handout on postural hypotension.

## 2014-09-18 ENCOUNTER — Telehealth (INDEPENDENT_AMBULATORY_CARE_PROVIDER_SITE_OTHER): Payer: Self-pay

## 2014-09-18 NOTE — Telephone Encounter (Signed)
Patient states that she went to PT yesterday and that you should be getting the report anytime. She states that her FMLA ends on 10/13/2014. Pt states that she will need a note to return to work at that time. She states that she would prefer not to go back before then. She said if there are any restrictions that is ok with her. Please advise.

## 2014-09-19 NOTE — Telephone Encounter (Signed)
That is fine for work note 10/13/2014 with no restrictions. tx FB

## 2014-09-20 ENCOUNTER — Encounter (HOSPITAL_COMMUNITY): Payer: Self-pay | Admitting: Cardiology

## 2014-09-20 NOTE — Telephone Encounter (Signed)
RTW note mailed to the patient 09/20/14, per her request.

## 2014-09-24 ENCOUNTER — Encounter: Payer: Self-pay | Admitting: Internal Medicine

## 2014-09-26 ENCOUNTER — Other Ambulatory Visit: Payer: Self-pay | Admitting: Internal Medicine

## 2014-10-02 ENCOUNTER — Telehealth: Payer: Self-pay | Admitting: Internal Medicine

## 2014-10-02 MED ORDER — ALPRAZOLAM 0.5 MG PO TABS: 0.5000 mg | ORAL_TABLET | Freq: Every day | ORAL | Status: DC

## 2014-10-02 NOTE — Telephone Encounter (Signed)
Pt is upset, she feels like "she always has to dance around getting her Xanex filled". [facility] as [facility] has not responded to the refill request. Pt has enough for two more days then out.    [facility] ----[phone] is phone #

## 2014-10-02 NOTE — Telephone Encounter (Signed)
Spoke to pt and she has an appt scheduled for February and would like to speak to PCP about changing to a different medication.

## 2014-10-02 NOTE — Telephone Encounter (Signed)
Pt is upset, she feels like "she always has to dance around getting her Xanex filled". [facility] told [facility] to call [facility] as this office has not responded to the refill request. Pt has enough for two more days then out.    [facility] ----[phone] is phone #

## 2014-10-02 NOTE — Telephone Encounter (Signed)
LVM for pt to call back.   RE: rx was faxed to pharmacy. Refill indicated has changed to 0 per MD. Pt needs to schedule an OV for any additional refills.   [facility]

## 2014-10-02 NOTE — Telephone Encounter (Signed)
Because Xanax as a controlled substance, there are stricter requirements regarding refills on this medication  Okay for 30 days, needs office visit with any provider at this office for review to continue additional refills  We'll also need to perform urinary drug screening at this next visit as per controlled substance policy of this practice  Thanks for the note

## 2014-10-22 ENCOUNTER — Encounter: Payer: Self-pay | Admitting: Internal Medicine

## 2014-11-16 ENCOUNTER — Other Ambulatory Visit: Payer: Self-pay | Admitting: Internal Medicine

## 2014-11-19 ENCOUNTER — Other Ambulatory Visit: Payer: Self-pay | Admitting: Internal Medicine

## 2014-11-21 ENCOUNTER — Encounter: Payer: Medicare Other | Admitting: Internal Medicine

## 2014-11-29 ENCOUNTER — Ambulatory Visit: Payer: Medicare Other | Admitting: Internal Medicine

## 2014-11-30 ENCOUNTER — Telehealth: Payer: Self-pay | Admitting: Internal Medicine

## 2014-11-30 NOTE — Telephone Encounter (Signed)
Patient no showed for fu on 2/18.  Please advise.

## 2014-11-30 NOTE — Telephone Encounter (Signed)
Please contact pt - reschedule as needed/if needed thanks

## 2014-11-30 NOTE — Telephone Encounter (Signed)
Got rescheduled

## 2014-12-03 ENCOUNTER — Encounter: Payer: Self-pay | Admitting: Internal Medicine

## 2014-12-03 ENCOUNTER — Ambulatory Visit (INDEPENDENT_AMBULATORY_CARE_PROVIDER_SITE_OTHER): Payer: Medicare Other | Admitting: Internal Medicine

## 2014-12-03 VITALS — BP 126/76 | HR 54 | Temp 98.0°F | Ht 66.0 in | Wt 124.2 lb

## 2014-12-03 DIAGNOSIS — D6489 Other specified anemias: Secondary | ICD-10-CM

## 2014-12-03 DIAGNOSIS — I10 Essential (primary) hypertension: Secondary | ICD-10-CM

## 2014-12-03 DIAGNOSIS — G479 Sleep disorder, unspecified: Secondary | ICD-10-CM

## 2014-12-03 DIAGNOSIS — E785 Hyperlipidemia, unspecified: Secondary | ICD-10-CM

## 2014-12-03 DIAGNOSIS — D649 Anemia, unspecified: Secondary | ICD-10-CM | POA: Insufficient documentation

## 2014-12-03 NOTE — Assessment & Plan Note (Signed)
Blood pressure goals reviewed. BMET 

## 2014-12-03 NOTE — Progress Notes (Signed)
Subjective:    Patient ID: Michele Burns, female    DOB: [birth year], 79 y.o.   MRN: [id number]  HPI  The patient is here to assess status of active health conditions.  She has been compliant with her medicines without adverse effects.  She is on a low-sodium heart healthy diet. [facility] for at least 60 minutes without cardiopulmonary symptoms.   Her major concern is her sleep for which she's been taking Xanax 0.5, one half-1 at bedtime as needed. She's also been using melatonin. She is concerned about the potential addiction risk or adverse effects of taking Xanax.  Following her surgery for volvulus she was documented to have significant anemia. Her hemoglobin and hematocrit was as low as 9.6 & 27.7 on 07/01/14.    PMH, FH, & Social History reviewed & updated.   Review of Systems  Significant headaches, chest pain, palpitations, exertional dyspnea, claudication, paroxysmal nocturnal dyspnea, or edema absent. No GI symptoms , memory loss or myalgias  Epistaxis, hemoptysis, hematuria, melena, or rectal bleeding denied. No unexplained weight loss, significant dyspepsia,dysphagia, or abdominal pain.  There is no abnormal bruising , bleeding, or difficulty stopping bleeding with injury.     Objective:   Physical Exam  Gen.: Adequately nourished in appearance. Alert, appropriate and cooperative throughout exam. BMI: 20.06 Appears much younger than stated age  Head: Normocephalic without obvious abnormalities  Eyes: No corneal or conjunctival inflammation noted. Pupils equal round reactive to light and accommodation. Extraocular motion intact. Faint arcus senilis Ears: External  ear exam reveals no significant lesions or deformities. Canals clear .TMs normal. Hearing is grossly normal bilaterally. Nose: External nasal exam reveals no deformity or inflammation. Nasal mucosa are pink and moist. No lesions or exudates noted.    Mouth: Oral mucosa and oropharynx reveal no lesions or exudates. Teeth in good repair. Neck: No deformities, masses, or tenderness noted.  Thyroid small. Lungs: Normal respiratory effort; chest expands symmetrically. Lungs are clear to auscultation without rales, wheezes, or increased work of breathing. Heart: Normal rate and rhythm. Normal S1 and S2. No gallop, click, or rub. No murmur. Abdomen: Bowel sounds normal; abdomen soft and nontender. No masses, organomegaly or hernias noted. Genitalia:  as per Gyn                                  Musculoskeletal/extremities: No deformity or scoliosis noted of  the thoracic or lumbar spine.  No clubbing, cyanosis, edema, or significant extremity  deformity noted.  Range of motion normal . Tone & strength normal. Hand joints normal Fingernail  health good. Crepitus of knees  Able to lie down & sit up w/o help.  Negative SLR bilaterally Vascular: Carotid, radial artery, dorsalis pedis and  posterior tibial pulses are full and equal. No bruits present. Neurologic: Alert and oriented x3. Deep tendon reflexes symmetrical and normal.  Gait normal      Skin: Intact without suspicious lesions or rashes. Lymph: No cervical, axillary lymphadenopathy present. Psych: Mood and affect are normal. Normally interactive  Assessment & Plan:  See Current Assessment & Plan in Problem List under specific Diagnosis

## 2014-12-03 NOTE — Patient Instructions (Addendum)
To prevent sleep dysfunction follow these instructions for sleep hygiene. Do not read, watch TV, or eat in bed. Do not get into bed until you are ready to turn off the light &  to go to sleep. Do not ingest stimulants ( decongestants, diet pills, nicotine, caffeine) after the evening meal.Do not take daytime naps.Cardiovascular exercise, this can be as simple a program as walking, is recommended 30-45 minutes 3-4 times per week.  Your next office appointment will be determined based upon review of your pending labs   Those instructions will be transmitted to you by mail. Critical values will be called. Followup as needed for any active or acute issue. Please report any significant change in your symptoms.

## 2014-12-03 NOTE — Assessment & Plan Note (Signed)
Lipids, LFTs, TSH ,CK 

## 2014-12-03 NOTE — Progress Notes (Signed)
Pre visit review using our clinic review tool, if applicable. No additional management support is needed unless otherwise documented below in the visit note. 

## 2014-12-03 NOTE — Assessment & Plan Note (Addendum)
  CBC & dif Iron panel B12

## 2014-12-04 ENCOUNTER — Telehealth: Payer: Self-pay | Admitting: Internal Medicine

## 2014-12-04 ENCOUNTER — Other Ambulatory Visit (INDEPENDENT_AMBULATORY_CARE_PROVIDER_SITE_OTHER): Payer: Medicare Other

## 2014-12-04 DIAGNOSIS — D6489 Other specified anemias: Secondary | ICD-10-CM | POA: Diagnosis not present

## 2014-12-04 DIAGNOSIS — I10 Essential (primary) hypertension: Secondary | ICD-10-CM

## 2014-12-04 DIAGNOSIS — E785 Hyperlipidemia, unspecified: Secondary | ICD-10-CM

## 2014-12-04 LAB — CBC WITH DIFFERENTIAL/PLATELET
Basophils Absolute: 0 10*3/uL (ref 0.0–0.1)
Basophils Relative: 0.6 % (ref 0.0–3.0)
EOS PCT: 2.4 % (ref 0.0–5.0)
Eosinophils Absolute: 0.1 10*3/uL (ref 0.0–0.7)
HCT: 36.7 % (ref 36.0–46.0)
Hemoglobin: 12.5 g/dL (ref 12.0–15.0)
LYMPHS PCT: 26.8 % (ref 12.0–46.0)
Lymphs Abs: 1.1 10*3/uL (ref 0.7–4.0)
MCHC: 34.1 g/dL (ref 30.0–36.0)
MCV: 91.1 fl (ref 78.0–100.0)
MONOS PCT: 11.7 % (ref 3.0–12.0)
Monocytes Absolute: 0.5 10*3/uL (ref 0.1–1.0)
NEUTROS PCT: 58.5 % (ref 43.0–77.0)
Neutro Abs: 2.3 10*3/uL (ref 1.4–7.7)
PLATELETS: 192 10*3/uL (ref 150.0–400.0)
RBC: 4.03 Mil/uL (ref 3.87–5.11)
RDW: 13.8 % (ref 11.5–15.5)
WBC: 4 10*3/uL (ref 4.0–10.5)

## 2014-12-04 LAB — LIPID PANEL
Cholesterol: 168 mg/dL (ref 0–200)
HDL: 89.9 mg/dL (ref >39.00)
LDL Cholesterol: 70 mg/dL (ref 0–99)
NonHDL: 78.1
Total CHOL/HDL Ratio: 2
Triglycerides: 39 mg/dL (ref 0.0–149.0)
VLDL: 7.8 mg/dL (ref 0.0–40.0)

## 2014-12-04 LAB — HEPATIC FUNCTION PANEL
ALBUMIN: 4.3 g/dL (ref 3.5–5.2)
ALT: 26 U/L (ref 0–35)
AST: 29 U/L (ref 0–37)
Alkaline Phosphatase: 45 U/L (ref 39–117)
BILIRUBIN TOTAL: 0.7 mg/dL (ref 0.2–1.2)
Bilirubin, Direct: 0.2 mg/dL (ref 0.0–0.3)
Total Protein: 6.9 g/dL (ref 6.0–8.3)

## 2014-12-04 LAB — BASIC METABOLIC PANEL
BUN: 20 mg/dL (ref 6–23)
CALCIUM: 9.5 mg/dL (ref 8.4–10.5)
CO2: 30 mEq/L (ref 19–32)
Chloride: 105 mEq/L (ref 96–112)
Creatinine, Ser: 0.83 mg/dL (ref 0.40–1.20)
GFR: 70.91 mL/min (ref >60.00)
Glucose, Bld: 92 mg/dL (ref 70–99)
Potassium: 4.6 mEq/L (ref 3.5–5.1)
Sodium: 138 mEq/L (ref 135–145)

## 2014-12-04 LAB — CK: CK TOTAL: 52 U/L (ref 7–177)

## 2014-12-04 LAB — IBC PANEL
Iron: 67 ug/dL (ref 42–145)
SATURATION RATIOS: 16.9 % — AB (ref 20.0–50.0)
TRANSFERRIN: 283 mg/dL (ref 212.0–360.0)

## 2014-12-04 LAB — TSH: TSH: 2.33 u[IU]/mL (ref 0.35–4.50)

## 2014-12-04 LAB — VITAMIN B12: Vitamin B-12: 238 pg/mL (ref 211–911)

## 2014-12-04 NOTE — Telephone Encounter (Signed)
emmi emailed °

## 2014-12-06 ENCOUNTER — Other Ambulatory Visit: Payer: Self-pay | Admitting: Internal Medicine

## 2014-12-06 DIAGNOSIS — E538 Deficiency of other specified B group vitamins: Secondary | ICD-10-CM | POA: Insufficient documentation

## 2014-12-12 ENCOUNTER — Telehealth: Payer: Self-pay | Admitting: Internal Medicine

## 2014-12-12 NOTE — Telephone Encounter (Signed)
She should ask insurance her mediaction options Dr Asa Lente can decide to prescribe or not. I don't think Xanax was used frequently

## 2014-12-12 NOTE — Telephone Encounter (Signed)
Patient seen Dr.  Linna Darner in regards to her alprazolam.  Patient has received a letter from [facility] stating that alprazolam is no longer on her drug list.  Patient would like to know what the next step would be for her.

## 2014-12-12 NOTE — Telephone Encounter (Signed)
Notified pt gave md response. Pt will call back with the alternatives...Michele Burns

## 2014-12-14 ENCOUNTER — Telehealth: Payer: Self-pay | Admitting: Internal Medicine

## 2014-12-14 NOTE — Telephone Encounter (Signed)
Marcia Brash is calling for alprazolam. RX has been denied because DX submitted is not an FDA approved use of this medication.

## 2014-12-17 ENCOUNTER — Telehealth: Payer: Self-pay | Admitting: *Deleted

## 2014-12-17 ENCOUNTER — Ambulatory Visit (INDEPENDENT_AMBULATORY_CARE_PROVIDER_SITE_OTHER): Payer: Medicare Other | Admitting: Internal Medicine

## 2014-12-17 ENCOUNTER — Encounter: Payer: Self-pay | Admitting: Internal Medicine

## 2014-12-17 VITALS — BP 120/64 | HR 56 | Ht 63.75 in | Wt 125.2 lb

## 2014-12-17 DIAGNOSIS — R143 Flatulence: Secondary | ICD-10-CM

## 2014-12-17 DIAGNOSIS — Z86010 Personal history of colonic polyps: Secondary | ICD-10-CM

## 2014-12-17 MED ORDER — CLONAZEPAM 0.5 MG PO TABS: 0.2500 mg | ORAL_TABLET | Freq: Every evening | ORAL | Status: DC | PRN

## 2014-12-17 NOTE — Telephone Encounter (Signed)
Patient called in to inquire on call from Friday. Advised of notes. [employer] advised incorrectly , and this is not an issue around a DX code. The alprazolam is no longer on her formulary. They were supposed to be faxing a list of accepted meds on Saturday. I have not seen this document. Are you currently in possession? Can we have a covered alternative sent in so that patient can be covered? Please call patient with resolution.   [facility] [address] [facility]   Lucy, I am copying you on this because the patient repeatedly asked for you.

## 2014-12-17 NOTE — Telephone Encounter (Signed)
[facility] was made aware...Michele Burns

## 2014-12-17 NOTE — Telephone Encounter (Signed)
Spoke to pt. She is very upset after I told her that her PCP wanted her to be seen by someone here in the office for the requested rx (or similar alternative).

## 2014-12-17 NOTE — Telephone Encounter (Signed)
Called pt inform her I called [employer] spoke with rep the alprazolam is on her formulary but it requires a PA. Pt stated that she just received call from [facility] [facility] stating that she would need appt to get something. Pt saw Dr. Linna Darner 12/03/14 for sleep issues but at the time she stated she didn't ask for a prescription for the alprazolam. Colletta Maryland spoke with Dr. Linna Darner and he has rx pt clonazepam 0.5 mg 1/2-1 tab qhs prn # 30. Inform pt that Dr. Linna Darner has given her Clonazepam, and [facility], also inform her that her insurance might require a PA with this one as well, and if so gave her my direct number to call and I will proceed with PA. [facility] spoke with Shawn[facility]/pharmacist gave md approval for clonazepam. Epic has been updated...[name]

## 2014-12-17 NOTE — Telephone Encounter (Signed)
After sending the below note I went to Dr. Linna Darner to inform him that there was a note that did need his attention as soon as possible.  He gave verbal and written instructions for rx of Klonopin 0.5mg  1/2 to 1 tab qhs.  I called the pt and was asked to hold. Pt was on the phone with Lucy.  Hung up so that I was not ease dropping on the conversation. I walked over to triage and gave lucy the hand written note from dr. Linna Darner.

## 2014-12-17 NOTE — Progress Notes (Signed)
Subjective:    Patient ID: Michele Burns, female    DOB: 15-Apr-1936, 79 y.o.   MRN: [id number] Cc: gas and history of polyps HPI The patient is a very nice elderly white woman with a history of colon polyps removed when she was living in [place]. I have seen her in the past and we were going to schedule a repeat colonoscopy 5 years after her last been but she was on clopidogrel and we were waiting until she came off of that. Subsequently last year she had a cecal volvulus and required surgery. She has recovered from that. He is complaining of increasing gas. She avoids milk, so she doesn't think she is lactose intolerant necessarily but she avoids it, she also stopped taking her probiotic because she thought that might be making worse but she still has flatulence issues that are mainly a social concern.  Allergies  Allergen Reactions  . Codeine     REACTION: severe nausea   Outpatient Prescriptions Prior to Visit  Medication Sig Dispense Refill  . aspirin EC 81 MG tablet Take 162 mg by mouth daily.     Marland Kitchen escitalopram (LEXAPRO) 20 MG tablet TAKE 1 TABLET (20 MG TOTAL) BY MOUTH DAILY. 90 tablet 0  . losartan (COZAAR) 50 MG tablet Take 1 tablet (50 mg total) by mouth daily. 90 tablet 3  . simvastatin (ZOCOR) 40 MG tablet Take 1 tablet (40 mg total) by mouth at bedtime. 90 tablet 3  . ALPRAZolam (XANAX) 0.5 MG tablet Take 1 tablet (0.5 mg total) by mouth at bedtime. 30 tablet 0  . Probiotic Product (PROBIOTIC ACIDOPHILUS) CAPS Take 1 tablet by mouth daily as needed (stomach pain).      No facility-administered medications prior to visit.   Past Medical History  Diagnosis Date  . Hx of colonic polyp 2006    diminutive '06, no polyps on 4/09 colo - for repeat 4/14  . IBS (irritable bowel syndrome)   . Erosive gastritis 2006    NSAID precipitated, resolved on f/u EGD  . MIGRAINE HEADACHE   . PULMONARY NODULE   . PVD     normal ABI/TBI 09/2009, chronic L toe pain and vasospasm  (Ray[facility])  . POSTMENOPAUSAL SYNDROME   . RENAL CYST   . Hypertension   . Hyperlipidemia   . DEPRESSION   . Coronary artery disease     a. Cath 11/17/12 - severe Ca++ moderately severe disease in the mid RCA, otherwise nonobstructive disease - s/p PTCA/rotational atherectomy & 2 DES to mid RCA 11/22/12.  . Sinus bradycardia     a. not on BB due to this.  . 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 Tonsillectomy      As a child  . Vaginal hysterectomy    . Excision morton's neuroma  1971    L 3/4 toe space  . Artherectomy  11/22/2012    RCA  . Abdominal hysterectomy    . Tonsillectomy    . Esophagogastroduodenoscopy    . Colonoscopy    . Ptca    . Laparoscopic right hemi colectomy  06/29/2014  . Laparotomy N/A 06/29/2014    Procedure: EXPLORATORY LAPAROTOMY;  Surgeon: Stark Klein, MD;  Location: [facility];  Service: General;  Laterality: N/A;  . Partial colectomy N/A 06/29/2014    Procedure: PARTIAL COLECTOMY;  Surgeon: Stark Klein, MD;  Location: [facility];  Service: General;  Laterality: N/A;  . Left heart catheterization with  coronary angiogram N/A 11/17/2012    Procedure: LEFT HEART CATHETERIZATION WITH CORONARY ANGIOGRAM;  Surgeon: Peter M Martinique, MD;  Location: [facility] CATH LAB;  Service: Cardiovascular;  Laterality: N/A;  . Percutaneous coronary stent intervention (pci-s) N/A 11/22/2012    Procedure: PERCUTANEOUS CORONARY STENT INTERVENTION (PCI-S);  Surgeon: Peter M Martinique, MD;  Location: [facility] CATH LAB;  Service: Cardiovascular;  Laterality: N/A;  . Percutaneous coronary rotoblator intervention (pci-r)  11/22/2012    Procedure: PERCUTANEOUS CORONARY ROTOBLATOR INTERVENTION (PCI-R);  Surgeon: Peter M Martinique, MD;  Location[facility] C[facility] LAB;  Service: Cardiovascular;;   History   Social History  . Marital Status: Widowed    Spouse Name: N/A  . Number of Children: 3  . Years of Education: N/A   Occupational History  . [employer]    Social History Main Topics    . Smoking status: Former Smoker -- 30 years    Types: Cigarettes    Quit date: 10/12/1980  . Smokeless tobacco: Never Used     Comment: She is widowed since 2002-she has 3 grown children and 5 g-kids. Moved to [place] from [place] in 2010 to be close to family  . Alcohol Use: 6.0 oz/week    10 Glasses of wine per week     Comment: <2 /day  . Drug Use: No  . Sexual Activity: Not on file   Other Topics Concern  . None   Social History Narrative   Family History  Problem Relation Age of Onset  . Heart attack Father 86    deceased  . Heart attack Mother 30    deceased  . Diabetes type II Mother   . Diabetes Mother   . Heart attack Brother 60    deceased  . Diabetes Paternal Grandmother     IDDM  . Colon cancer Neg Hx   . Colon polyps Neg Hx   . Kidney disease Neg Hx   . Esophageal cancer Neg Hx   . Gallbladder disease Neg Hx    Review of Systems Remainder review of systems is negative or otherwise as per history of present illness. She is still working at [employer].    Objective:   Physical Exam BP 120/64 mmHg  Pulse 56  Ht 5' 3.75" (1.619 m)  Wt 125 lb 4 oz (56.813 kg)  BMI 21.67 kg/m2 General:  NAD Eyes:   anicteric Lungs:  clear Heart:  S1S2 no rubs, murmurs or gallops Abdomen:  soft and nontender, BS+  Data Reviewed:  Prior colonoscopies     Assessment & Plan:  Hx of colonic polyps  Intestinal gas    1) Retry probiotic for gas 2) Gas diet handout 3) surveillance colonoscopy 4) The risks and benefits as well as alternatives of endoscopic procedure(s) have been discussed and reviewed. All questions answered. The patient agrees to proceed.

## 2014-12-17 NOTE — Patient Instructions (Signed)
You have been scheduled for a colonoscopy. Please follow written instructions given to you at your visit today.  Please pick up your prep supplies at the pharmacy within the next 1-3 days. If you use inhalers (even only as needed), please bring them with you on the day of your procedure.  Today you have been given a handout on GAS to read.   Please purchase a probiotic and use daily to put the good bacteria back into your colon.   I appreciate the opportunity to care for you. Silvano Rusk, M.D., [facility]

## 2014-12-17 NOTE — Telephone Encounter (Signed)
This is much safer agent than Xanax

## 2014-12-17 NOTE — Telephone Encounter (Signed)
Per PCP conversation pt will need to have an appt to add this medication.   LVM for pt to call back.

## 2014-12-17 NOTE — Telephone Encounter (Signed)
[facility] [phone] stating Clonazepam requires prior authorization. This form has been completed on cover my meds. Waiting on insurance response.

## 2014-12-25 NOTE — Telephone Encounter (Signed)
Patient called back. I explained to her, her insurance has denied coverage on the Clonazepam. She should be getting a denial letter in the mail. She ended [facility]

## 2014-12-25 NOTE — Telephone Encounter (Signed)
Please explain process to her .It does not make sense that no coverage available for alternative ; but apparently denied & no options provided by her plan. She should price Clonazepam @ independent pharmacies as may be much cheaper

## 2014-12-25 NOTE — Telephone Encounter (Signed)
[facility] [phone]) called to check status of PA. Patient then called [facility], upset stating for someone to just call [facility] to inform [facility] that Dr. Linna Darner approved a Rx for her to fill clonazepam. Called [facility], then transferred to [facility] at [phone] (option #4, #1) to obtain information on the PA. [employer], spoke with Thayer Headings, who advised this is a HMO Blue Medicare pt and need to call [phone]. Called [facility] Medicare spoke with Gwenlyn Perking who advised that medication was denied and both MD office and pt were notified. Denial letter mailed out to office on 12/19/14 and a message was left for the patient on 12/19/14 at 11:30am. Per Olin Hauser, no alternative listed and MD office welcome to appeal. Please advise for this pt as she would like a call back today. Thanks

## 2014-12-25 NOTE — Telephone Encounter (Signed)
Left message for patient to return call.

## 2015-01-15 ENCOUNTER — Encounter: Payer: Self-pay | Admitting: Internal Medicine

## 2015-01-16 LAB — HM MAMMOGRAPHY

## 2015-01-21 ENCOUNTER — Encounter: Payer: Self-pay | Admitting: Internal Medicine

## 2015-01-21 ENCOUNTER — Ambulatory Visit (INDEPENDENT_AMBULATORY_CARE_PROVIDER_SITE_OTHER): Payer: Medicare Other | Admitting: Internal Medicine

## 2015-01-21 VITALS — BP 122/80 | HR 48 | Temp 97.6°F | Resp 18 | Wt 128.0 lb

## 2015-01-21 DIAGNOSIS — F5104 Psychophysiologic insomnia: Secondary | ICD-10-CM | POA: Insufficient documentation

## 2015-01-21 DIAGNOSIS — G4700 Insomnia, unspecified: Secondary | ICD-10-CM | POA: Diagnosis not present

## 2015-01-21 NOTE — Progress Notes (Signed)
   Subjective:    Patient ID: Michele Burns, female    DOB: 1936/04/08, 79 y.o.   MRN: [id number]  HPI The patient is a 79 YO woman who is coming in to follow up of her insomnia. She is using clonazepam with good success. It works most of the night and she does not feel groggy. Does not have any weird dreams or sleepwalking. Sometimes wakes in the night even with the clonazepam. Overall is stressed at home lately and has been struggling since the death of her granddaughter 2-3 years ago from drowning (was 77 years old). Tries to avoid caffeine in the afternoon and does not watch tv in the bedroom.   Review of Systems  Constitutional: Negative for fever, activity change, appetite change, fatigue and unexpected weight change.  Respiratory: Negative for cough, chest tightness, shortness of breath and wheezing.   Cardiovascular: Negative for chest pain, palpitations and leg swelling.  Gastrointestinal: Negative for abdominal pain, diarrhea, constipation and abdominal distention.  Neurological: Negative.   Psychiatric/Behavioral: Positive for sleep disturbance. Negative for hallucinations, confusion, self-injury, dysphoric mood and decreased concentration.      Objective:   Physical Exam  Constitutional: She is oriented to person, place, and time. She appears well-developed and well-nourished.  HENT:  Head: Normocephalic and atraumatic.  Eyes: EOM are normal.  Neck: Normal range of motion.  Cardiovascular: Normal rate and regular rhythm.   Pulmonary/Chest: Effort normal and breath sounds normal. No respiratory distress. She has no wheezes.  Abdominal: Soft. Bowel sounds are normal.  Neurological: She is alert and oriented to person, place, and time. Coordination normal.  Skin: Skin is warm and dry.  Psychiatric: She has a normal mood and affect.   Filed Vitals:   01/21/15 0854  BP: 122/80  Pulse: 48  Temp: 97.6 F (36.4 C)  TempSrc: Oral  Resp: 18  Weight: 128 lb (58.06 kg)  SpO2:  97%      Assessment & Plan:

## 2015-01-21 NOTE — Progress Notes (Signed)
Pre visit review using our clinic review tool, if applicable. No additional management support is needed unless otherwise documented below in the visit note. 

## 2015-01-21 NOTE — Patient Instructions (Signed)
We will have you keep taking the clonazepam for sleep and if you have problems with it call us and let us know.   Come back sometime in the fall for a physical. If you have problems or questions please feel free to call the office.   Insomnia Insomnia is frequent trouble falling and/or staying asleep. Insomnia can be a long term problem or a short term problem. Both are common. Insomnia can be a short term problem when the wakefulness is related to a certain stress or worry. Long term insomnia is often related to ongoing stress during waking hours and/or poor sleeping habits. Overtime, sleep deprivation itself can make the problem worse. Every little thing feels more severe because you are overtired and your ability to cope is decreased. CAUSES   Stress, anxiety, and depression.  Poor sleeping habits.  Distractions such as TV in the bedroom.  Naps close to bedtime.  Engaging in emotionally charged conversations before bed.  Technical reading before sleep.  Alcohol and other sedatives. They may make the problem worse. They can hurt normal sleep patterns and normal dream activity.  Stimulants such as caffeine for several hours prior to bedtime.  Pain syndromes and shortness of breath can cause insomnia.  Exercise late at night.  Changing time zones may cause sleeping problems (jet lag). It is sometimes helpful to have someone observe your sleeping patterns. They should look for periods of not breathing during the night (sleep apnea). They should also look to see how long those periods last. If you live alone or observers are uncertain, you can also be observed at a sleep clinic where your sleep patterns will be professionally monitored. Sleep apnea requires a checkup and treatment. Give your caregivers your medical history. Give your caregivers observations your family has made about your sleep.  SYMPTOMS   Not feeling rested in the morning.  Anxiety and restlessness at  bedtime.  Difficulty falling and staying asleep. TREATMENT   Your caregiver may prescribe treatment for an underlying medical disorders. Your caregiver can give advice or help if you are using alcohol or other drugs for self-medication. Treatment of underlying problems will usually eliminate insomnia problems.  Medications can be prescribed for short time use. They are generally not recommended for lengthy use.  Over-the-counter sleep medicines are not recommended for lengthy use. They can be habit forming.  You can promote easier sleeping by making lifestyle changes such as:  Using relaxation techniques that help with breathing and reduce muscle tension.  Exercising earlier in the day.  Changing your diet and the time of your last meal. No night time snacks.  Establish a regular time to go to bed.  Counseling can help with stressful problems and worry.  Soothing music and white noise may be helpful if there are background noises you cannot remove.  Stop tedious detailed work at least one hour before bedtime. HOME CARE INSTRUCTIONS   Keep a diary. Inform your caregiver about your progress. This includes any medication side effects. See your caregiver regularly. Take note of:  Times when you are asleep.  Times when you are awake during the night.  The quality of your sleep.  How you feel the next day. This information will help your caregiver care for you.  Get out of bed if you are still awake after 15 minutes. Read or do some quiet activity. Keep the lights down. Wait until you feel sleepy and go back to bed.  Keep regular sleeping and waking hours.  Avoid naps.  Exercise regularly.  Avoid distractions at bedtime. Distractions include watching television or engaging in any intense or detailed activity like attempting to balance the household checkbook.  Develop a bedtime ritual. Keep a familiar routine of bathing, brushing your teeth, climbing into bed at the same time  each night, listening to soothing music. Routines increase the success of falling to sleep faster.  Use relaxation techniques. This can be using breathing and muscle tension release routines. It can also include visualizing peaceful scenes. You can also help control troubling or intruding thoughts by keeping your mind occupied with boring or repetitive thoughts like the old concept of counting sheep. You can make it more creative like imagining planting one beautiful flower after another in your backyard garden.  During your day, work to eliminate stress. When this is not possible use some of the previous suggestions to help reduce the anxiety that accompanies stressful situations. MAKE SURE YOU:   Understand these instructions.  Will watch your condition.  Will get help right away if you are not doing well or get worse. Document Released: 09/25/2000 Document Revised: 12/21/2011 Document Reviewed: 10/26/2007 [facility] Patient Information 2015 [place], [place]. This information is not intended to replace advice given to you by your health care provider. Make sure you discuss any questions you have with your health care provider.

## 2015-01-21 NOTE — Assessment & Plan Note (Signed)
She is doing well with clonazepam for now. Will continue for now. She is trying to limit distractions at home and is practicing good sleep hygiene.

## 2015-01-24 ENCOUNTER — Other Ambulatory Visit: Payer: Self-pay | Admitting: Internal Medicine

## 2015-01-25 NOTE — Telephone Encounter (Signed)
OK x 1 

## 2015-01-25 NOTE — Telephone Encounter (Signed)
[facility] [phone]

## 2015-01-29 ENCOUNTER — Encounter: Payer: Self-pay | Admitting: Internal Medicine

## 2015-01-30 ENCOUNTER — Telehealth: Payer: Self-pay

## 2015-01-30 NOTE — Telephone Encounter (Signed)
Educated the patient regarding the annual wellness visit. The patient states she is having a colonoscopy this week. Also states she follows up on her screens and maintains her health and has apt with Dr. Doug Sou for a physical. Noted there was not an apt with Dr. Doug Sou and the patient agreed to make an apt post colonoscopy and Cardiac fup. Also educated on pneumonia due to changes in CDC recommendations and will discuss that with Dr. Doug Sou when she comes back for her physical., Continues to work full time and scheduling is difficult.

## 2015-01-31 ENCOUNTER — Ambulatory Visit (AMBULATORY_SURGERY_CENTER): Payer: Medicare Other | Admitting: Internal Medicine

## 2015-01-31 ENCOUNTER — Encounter: Payer: Self-pay | Admitting: Internal Medicine

## 2015-01-31 VITALS — BP 120/68 | HR 45 | Temp 96.2°F | Resp 13 | Ht 63.0 in | Wt 128.0 lb

## 2015-01-31 DIAGNOSIS — Z86010 Personal history of colonic polyps: Secondary | ICD-10-CM | POA: Diagnosis not present

## 2015-01-31 DIAGNOSIS — D123 Benign neoplasm of transverse colon: Secondary | ICD-10-CM | POA: Diagnosis not present

## 2015-01-31 MED ORDER — SODIUM CHLORIDE 0.9 % IV SOLN: 500.0000 mL | INTRAVENOUS | Status: DC

## 2015-01-31 NOTE — Patient Instructions (Addendum)
I found and removed one small benign-appearing polyp. I will let you know the pathology in a letter.  I am not going to recommend future routine colonoscopy for you based upon today's findings and your history.  I appreciate the opportunity to care for you. Gatha Mayer, MD, FACG  YOU HAD AN ENDOSCOPIC PROCEDURE TODAY AT [facility]:   Refer to the procedure report that was given to you for any specific questions about what was found during the examination.  If the procedure report does not answer your questions, please call your gastroenterologist to clarify.  If you requested that your care partner not be given the details of your procedure findings, then the procedure report has been included in a sealed envelope for you to review at your convenience later.  YOU SHOULD EXPECT: Some feelings of bloating in the abdomen. Passage of more gas than usual.  Walking can help get rid of the air that was put into your GI tract during the procedure and reduce the bloating. If you had a lower endoscopy (such as a colonoscopy or flexible sigmoidoscopy) you may notice spotting of blood in your stool or on the toilet paper. If you underwent a bowel prep for your procedure, you may not have a normal bowel movement for a few days.  Please Note:  You might notice some irritation and congestion in your nose or some drainage.  This is from the oxygen used during your procedure.  There is no need for concern and it should clear up in a day or so.  SYMPTOMS TO REPORT IMMEDIATELY:   Following lower endoscopy (colonoscopy or flexible sigmoidoscopy):  Excessive amounts of blood in the stool  Significant tenderness or worsening of abdominal pains  Swelling of the abdomen that is new, acute  Fever of 100F or higher   For urgent or emergent issues, a gastroenterologist can be reached at any hour by calling [phone].   DIET: Your first meal following the procedure should be a  small meal and then it is ok to progress to your normal diet. Heavy or fried foods are harder to digest and may make you feel nauseous or bloated.  Likewise, meals heavy in dairy and vegetables can increase bloating.  Drink plenty of fluids but you should avoid alcoholic beverages for 24 hours.  ACTIVITY:  You should plan to take it easy for the rest of today and you should NOT DRIVE or use heavy machinery until tomorrow (because of the sedation medicines used during the test).    FOLLOW UP: Our staff will call the number listed on your records the next business day following your procedure to check on you and address any questions or concerns that you may have regarding the information given to you following your procedure. If we do not reach you, we will leave a message.  However, if you are feeling well and you are not experiencing any problems, there is no need to return our call.  We will assume that you have returned to your regular daily activities without incident.  If any biopsies were taken you will be contacted by phone or by letter within the next 1-3 weeks.  Please call us at [phone] if you have not heard about the biopsies in 3 weeks.    SIGNATURES/CONFIDENTIALITY: You and/or your care partner have signed paperwork which will be entered into your electronic medical record.  These signatures attest to the fact that that the information  above on your After Visit Summary has been reviewed and is understood.  Full responsibility of the confidentiality of this discharge information lies with you and/or your care-partner.  Polyp information given.

## 2015-01-31 NOTE — Progress Notes (Signed)
Report to PACU, RN, vss, BBS= Clear.  

## 2015-01-31 NOTE — Progress Notes (Signed)
Called to room to assist during endoscopic procedure.  Patient ID and intended procedure confirmed with present staff. Received instructions for my participation in the procedure from the performing physician.  

## 2015-01-31 NOTE — Op Note (Addendum)
[facility]  [address] [place], [id number]   COLONOSCOPY PROCEDURE REPORT  PATIENT: Michele Burns, Michele Burns  MR#: [id number] BIRTHDATE: 1935-11-14 , 18  yrs. old GENDER: female ENDOSCOPIST: Gatha Mayer, MD, [facility] PROCEDURE DATE:  01/31/2015 PROCEDURE:   Colonoscopy, surveillance and Colonoscopy with biopsy First Screening Colonoscopy - Avg.  risk and is 50 yrs.  old or older - No.  Prior Negative Screening - Now for repeat screening. N/A  History of Adenoma - Now for follow-up colonoscopy & has been > or = to 3 yrs.  Yes hx of adenoma.  Has been 3 or more years since last colonoscopy. ASA CLASS:   Class III INDICATIONS:Surveillance due to prior colonic neoplasia and PH Colon Adenoma. MEDICATIONS: Propofol 200 mg IV and Monitored anesthesia care  DESCRIPTION OF PROCEDURE:   After the risks benefits and alternatives of the procedure were thoroughly explained, informed consent was obtained.  The digital rectal exam revealed no abnormalities of the rectum.   The [id number]  endoscope was introduced through the anus and advanced to the cecum, which was identified by both the appendix and ileocecal valve. No adverse events experienced.   The quality of the prep was good.  (MiraLax was used)  The instrument was then slowly withdrawn as the colon was fully examined.      COLON FINDINGS: A sessile polyp measuring 3 mm in size was found in the transverse colon.  A polypectomy was performed with cold forceps.  The resection was complete, the polyp tissue was completely retrieved and sent to histology.   There was evidence of a prior end-to-side ileocolonic surgical anastomosis in the right colon.   The examination was otherwise normal.  Retroflexed rectal exam not done.. The time to cecum = 9.1 Withdrawal time = 8.2   The scope was withdrawn and the procedure completed. COMPLICATIONS: There were no immediate complications.  ENDOSCOPIC IMPRESSION: 1.    Sessile polyp was found in the transverse colon; polypectomy was performed with cold forceps 2.   There was evidence of a prior ileocolonic surgical anastomosis in the right colon 3.   The examination was otherwise normal - good prep - hx polyps  RECOMMENDATIONS: Will notify pathology results. Do not anticipate she will need future routine colonoscopy based upon findings, history and age.  eSigned:  Gatha Mayer, MD, [facility] 01/31/2015 2:09 PM Revised: 01/31/2015 2:09 PM  cc: The Patient

## 2015-02-01 ENCOUNTER — Telehealth: Payer: Self-pay | Admitting: *Deleted

## 2015-02-01 NOTE — Telephone Encounter (Signed)
  Follow up Call-  Call back number 01/31/2015  Post procedure Call Back phone  # [phone]  Permission to leave phone message Yes    [facility]

## 2015-02-10 ENCOUNTER — Encounter: Payer: Self-pay | Admitting: Internal Medicine

## 2015-02-10 DIAGNOSIS — Z860101 Personal history of adenomatous and serrated colon polyps: Secondary | ICD-10-CM

## 2015-02-10 DIAGNOSIS — Z86010 Personal history of colonic polyps: Secondary | ICD-10-CM

## 2015-02-10 HISTORY — DX: Personal history of adenomatous and serrated colon polyps: Z86.0101

## 2015-02-10 HISTORY — DX: Personal history of colonic polyps: Z86.010

## 2015-02-10 NOTE — Progress Notes (Signed)
Quick Note:  3 mm adenoma Would not repeat routine colonoscopy (age + hx) ______

## 2015-02-11 ENCOUNTER — Ambulatory Visit (INDEPENDENT_AMBULATORY_CARE_PROVIDER_SITE_OTHER): Payer: Medicare Other | Admitting: Cardiovascular Disease

## 2015-02-11 ENCOUNTER — Encounter: Payer: Self-pay | Admitting: Cardiovascular Disease

## 2015-02-11 VITALS — BP 110/66 | HR 53 | Ht 66.0 in | Wt 123.8 lb

## 2015-02-11 DIAGNOSIS — I2510 Atherosclerotic heart disease of native coronary artery without angina pectoris: Secondary | ICD-10-CM | POA: Diagnosis not present

## 2015-02-11 DIAGNOSIS — I10 Essential (primary) hypertension: Secondary | ICD-10-CM | POA: Diagnosis not present

## 2015-02-11 NOTE — Patient Instructions (Signed)
Medication Instructions:  Your physician recommends that you continue on your current medications as directed. Please refer to the Current Medication list given to you today.  Labwork: No new orders.  Testing/Procedures: Your physician has requested that you have a stress echocardiogram. For further information please visit [email or web address]. Please follow instruction sheet as given.  Follow-Up: Your physician wants you to follow-up in: 1 YEAR with Dr Burt Knack.  You will receive a reminder letter in the mail two months in advance. If you don't receive a letter, please call our office to schedule the follow-up appointment.   Any Other Special Instructions Will Be Listed Below (If Applicable).

## 2015-02-11 NOTE — Progress Notes (Signed)
Cardiology Office Note   Date:  02/11/2015   ID:  Michele Burns, DOB November 02, 1935, MRN [id number]  PCP:  Olga Millers, MD  Cardiologist:  Sherren Mocha, MD    Chief Complaint  Patient presents with  . Coronary Artery Disease     History of Present Illness: Michele Burns is a 79 y.o. female who presents for follow-up of CAD. She underwent PCI of the right coronary artery in February 2014 after presenting with symptoms of unstable angina. She was treated with overlapping drug-eluting stents and adjunctive rotational atherectomy of the RCA. Her left ventricular ejection fraction was 55-60%.  Reports no significant change in any of her symptoms. She is not limited by chest pain, chest pressure, or shortness of breath. She denies leg swelling, orthopnea, or PND.  Past Medical History  Diagnosis Date  . Hx of colonic polyp 2006    diminutive '06, no polyps on 4/09 colo - for repeat 4/14  . IBS (irritable bowel syndrome)   . Erosive gastritis 2006    NSAID precipitated, resolved on f/u EGD  . MIGRAINE HEADACHE   . PULMONARY NODULE   . PVD     normal ABI/TBI 09/2009, chronic L toe pain and vasospasm ([place])  . POSTMENOPAUSAL SYNDROME   . RENAL CYST   . Hypertension   . Hyperlipidemia   . DEPRESSION   . Coronary artery disease     a. Cath 11/17/12 - severe Ca++ moderately severe disease in the mid RCA, otherwise nonobstructive disease - s/p PTCA/rotational atherectomy & 2 DES to mid RCA 11/22/12.  . Sinus bradycardia     a. not on BB due to this.  . 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 Hx of adenomatous colonic polyps 02/10/2015    Past Surgical History  Procedure Laterality Date  . Tonsillectomy      As a child  . Vaginal hysterectomy    . Excision morton's neuroma  1971    L 3/4 toe space  . Artherectomy  11/22/2012    RCA  . Abdominal hysterectomy    . Tonsillectomy    . Esophagogastroduodenoscopy    . Colonoscopy    . Ptca    . Laparoscopic right hemi  colectomy  06/29/2014  . Laparotomy N/A 06/29/2014    Procedure: EXPLORATORY LAPAROTOMY;  Surgeon: Stark Klein, MD;  Location: [facility];  Service: General;  Laterality: N/A;  . Partial colectomy N/A 06/29/2014    Procedure: PARTIAL COLECTOMY;  Surgeon: Stark Klein, MD;  Location: [facility];  Service: General;  Laterality: N/A;  . Left heart catheterization with coronary angiogram N/A 11/17/2012    Procedure: LEFT HEART CATHETERIZATION WITH CORONARY ANGIOGRAM;  Surgeon: Peter M Martinique, MD;  Location: [facility] CATH LAB;  Service: Cardiovascular;  Laterality: N/A;  . Percutaneous coronary stent intervention (pci-s) N/A 11/22/2012    Procedure: PERCUTANEOUS CORONARY STENT INTERVENTION (PCI-S);  Surgeon: Peter M Martinique, MD;  Location: [facility] C[facility] LAB;  Service: Cardiovascular;  Laterality: N/A;  . Percutaneous coronary rotoblator intervention (pci-r)  11/22/2012    Procedure: PERCUTANEOUS CORONARY ROTOBLATOR INTERVENTION (PCI-R);  Surgeon: Peter M Martinique, MD;  Location: [facility] CATH LAB;  Service: Cardiovascular;;    Current Outpatient Prescriptions  Medication Sig Dispense Refill  . aspirin EC 81 MG tablet Take 162 mg by mouth daily.     . clonazePAM (KLONOPIN) 0.5 MG tablet TAKE 1/2-1 TABLET AT BEDTIME AS NEEDED 30 tablet 0  . escitalopram (LEXAPRO) 20 MG tablet TAKE 1 TABLET (20 MG TOTAL) BY  MOUTH DAILY. 90 tablet 0  . losartan (COZAAR) 50 MG tablet Take 1 tablet (50 mg total) by mouth daily. 90 tablet 3  . simvastatin (ZOCOR) 40 MG tablet Take 1 tablet (40 mg total) by mouth at bedtime. 90 tablet 3   No current facility-administered medications for this visit.    Allergies:   Codeine   Social History:  The patient  reports that she quit smoking about 34 years ago. Her smoking use included Cigarettes. She quit after 30 years of use. She has never used smokeless tobacco. She reports that she drinks about 6.0 oz of alcohol per week. She reports that she does not use illicit drugs.   Family History:  The patient's  family history includes Diabetes in her mother and paternal grandmother; Diabetes type II in her mother; Heart attack (age of onset: 80) in her brother; Heart attack (age of onset: 25) in her father; Heart attack (age of onset: 60) in her mother. There is no history of Colon cancer, Colon polyps, Kidney disease, Esophageal cancer, or Gallbladder disease.    ROS:  Please see the history of present illness.  Otherwise, review of systems is positive for cough, visual disturbance, hearing loss.  All other systems are reviewed and negative.    PHYSICAL EXAM: VS:  BP 110/66 mmHg  Pulse 53  Ht 5\' 6"  (1.676 m)  Wt 123 lb 12.8 oz (56.155 kg)  BMI 19.99 kg/m2 , BMI Body mass index is 19.99 kg/(m^2). GEN: Well nourished, well developed, in no acute distress HEENT: normal Neck: no JVD, no masses. No carotid bruits Cardiac: RRR without murmur or gallop                Respiratory:  clear to auscultation bilaterally, normal work of breathing GI: soft, nontender, nondistended, + BS MS: no deformity or atrophy Ext: no pretibial edema, pedal pulses 2+= bilaterally Skin: warm and dry Neuro:  Strength and sensation are intact Psych: euthymic mood, full affect  EKG:  EKG is ordered today. The ekg ordered today shows sinus bradycardia 53 bpm, left anterior fascicular block  Recent Labs: 12/04/2014: ALT 26; BUN 20; Creatinine 0.83; Hemoglobin 12.5; Platelets 192.0; Potassium 4.6; Sodium 138; TSH 2.33   Lipid Panel     Component Value Date/Time   CHOL 168 12/04/2014 1222   TRIG 39.0 12/04/2014 1222   TRIG 78 04/17/2009   HDL 89.90 12/04/2014 1222   CHOLHDL 2 12/04/2014 1222   VLDL 7.8 12/04/2014 1222   LDLCALC 70 12/04/2014 1222      Wt Readings from Last 3 Encounters:  02/11/15 123 lb 12.8 oz (56.155 kg)  01/31/15 128 lb (58.06 kg)  01/21/15 128 lb (58.06 kg)     Cardiac Studies Reviewed: Cardiac catheterization 11/17/2012: Procedural Findings: Hemodynamics: AO 163/95 with a mean of 171  mmHg LV 163/15 mmHg  Coronary angiography: Coronary dominance: right  Left mainstem: The left main coronary is mildly calcified in the distal vessel. It has no significant obstructive disease.  Left anterior descending (LAD): The LAD is heavily calcified in the proximal and mid vessel. There is 30% disease at the ostium. In the mid vessel at the takeoff of the second diagonal there is 40% disease in the LAD. The first diagonal is without significant disease. The second diagonal has 40-50% disease at its origin.  Left circumflex (LCx): The left circumflex is a large vessel that gives rise to 2 marginal branches in the mid vessel. There is 30-40% disease at the bifurcation  of the marginals.  Right coronary artery (RCA): The right coronary has a downward takeoff that is somewhat out of plane. After a great deal of difficulty we were able to engage it with a multipurpose 1 guide catheter. The right coronary is severely calcified in the proximal, mid, and distal vessel. There is a long segmental 80% stenosis in the mid vessel followed by a segmental 80% stenosis at the crux.  Left ventriculography: Left ventricular systolic function is normal, LVEF is estimated at 55-60%, there is mild anterior wall hypokinesis, there is no significant mitral regurgitation   Final Conclusions:  1. Single vessel obstructive coronary disease. The right coronary has severe calcification and moderately severe disease in the mid vessel and crux. 2. Good left ventricular function.  Recommendations: All reviewed treatment options with the patient including medical therapy versus percutaneous intervention. Percutaneous intervention would be difficult and rotational atherectomy would be necessary to achieve optimal result.  ASSESSMENT AND PLAN: 1.  CAD, native vessel: The patient has diffuse coronary artery disease based on review of her most recent heart catheterization. She has undergone complex PCI of the right coronary  artery. There is 40-50% stenosis of the LAD present. I have recommended an exercise stress echocardiogram for evaluation.  2. Essential hypertension: Blood pressure well controlled on losartan  3. Hyperlipidemia: The patient continues on simvastatin. Lipids are followed by her primary physician.   Current medicines are reviewed with the patient today.  The patient does not have concerns regarding medicines.  Labs/ tests ordered today include:   Orders Placed This Encounter  Procedures  . EKG 12-Lead  . Echo stress   Disposition:   FU one year  Signed, Sherren Mocha, MD  02/11/2015 2:11 PM    [facility] [address], [place], [place]  [address] Phone: [phone]; Fax: [phone]

## 2015-02-17 ENCOUNTER — Other Ambulatory Visit: Payer: Self-pay | Admitting: Internal Medicine

## 2015-02-23 ENCOUNTER — Other Ambulatory Visit: Payer: Self-pay | Admitting: Internal Medicine

## 2015-02-28 ENCOUNTER — Telehealth (HOSPITAL_COMMUNITY): Payer: Self-pay | Admitting: *Deleted

## 2015-02-28 NOTE — Telephone Encounter (Signed)
Left message on voicemail in reference to upcoming appointment scheduled for 03/04/15. Phone number given for a call back so details instructions can be given. Crissie Figures, RN

## 2015-03-01 NOTE — Telephone Encounter (Signed)
Called pharmacy had to leave on pharmacist vm left md approval.../lmb

## 2015-03-04 ENCOUNTER — Ambulatory Visit (HOSPITAL_COMMUNITY): Payer: Medicare Other | Attending: Cardiovascular Disease

## 2015-03-04 DIAGNOSIS — I2510 Atherosclerotic heart disease of native coronary artery without angina pectoris: Secondary | ICD-10-CM | POA: Diagnosis not present

## 2015-03-04 DIAGNOSIS — E785 Hyperlipidemia, unspecified: Secondary | ICD-10-CM | POA: Diagnosis not present

## 2015-03-04 DIAGNOSIS — I10 Essential (primary) hypertension: Secondary | ICD-10-CM | POA: Diagnosis not present

## 2015-03-04 DIAGNOSIS — Z87891 Personal history of nicotine dependence: Secondary | ICD-10-CM | POA: Insufficient documentation

## 2015-04-05 ENCOUNTER — Ambulatory Visit (INDEPENDENT_AMBULATORY_CARE_PROVIDER_SITE_OTHER): Payer: Medicare Other | Admitting: Internal Medicine

## 2015-04-05 ENCOUNTER — Encounter: Payer: Self-pay | Admitting: Internal Medicine

## 2015-04-05 VITALS — BP 116/72 | HR 54 | Temp 98.2°F | Resp 16 | Wt 123.0 lb

## 2015-04-05 DIAGNOSIS — R251 Tremor, unspecified: Secondary | ICD-10-CM | POA: Diagnosis not present

## 2015-04-05 NOTE — Assessment & Plan Note (Signed)
Tremor stable and not interfering with ADLs. Not indicated for medication at this time and support and reassurance given.

## 2015-04-05 NOTE — Progress Notes (Signed)
   Subjective:    Patient ID: Michele Burns, female    DOB: 10-19-1935, 79 y.o.   MRN: [id number]  HPI The patient is a 79 YO woman who is coming in to follow up on her tremor. She noticed it years ago and it has been the same since then. Rare and easily controlled. She does not take anything for it. Wonders if it will get worse or mean anything bad for the future.   Review of Systems  Constitutional: Negative for fever, activity change, appetite change, fatigue and unexpected weight change.  Respiratory: Negative for cough, chest tightness, shortness of breath and wheezing.   Cardiovascular: Negative for chest pain, palpitations and leg swelling.  Gastrointestinal: Negative for abdominal pain, diarrhea, constipation and abdominal distention.  Neurological: Positive for tremors.       Mild  Psychiatric/Behavioral: Negative for hallucinations, confusion, self-injury, dysphoric mood and decreased concentration.      Objective:   Physical Exam  Constitutional: She is oriented to person, place, and time. She appears well-developed and well-nourished.  HENT:  Head: Normocephalic and atraumatic.  Eyes: EOM are normal.  Neck: Normal range of motion.  Cardiovascular: Normal rate and regular rhythm.   Pulmonary/Chest: Effort normal and breath sounds normal. No respiratory distress. She has no wheezes.  Abdominal: Soft. Bowel sounds are normal.  Neurological: She is alert and oriented to person, place, and time. Coordination normal.  Skin: Skin is warm and dry.  Psychiatric: She has a normal mood and affect.   Filed Vitals:   04/05/15 1019  BP: 116/72  Pulse: 54  Temp: 98.2 F (36.8 C)  TempSrc: Oral  Resp: 16  Weight: 123 lb (55.792 kg)  SpO2: 98%      Assessment & Plan:

## 2015-04-05 NOTE — Patient Instructions (Signed)
We will change the instructions the next time we refill the medicine clonazepam.  The tremor is nothing to worry about unless it gets to where it bothers you all the time.

## 2015-04-05 NOTE — Progress Notes (Signed)
Pre visit review using our clinic review tool, if applicable. No additional management support is needed unless otherwise documented below in the visit note. 

## 2015-04-17 ENCOUNTER — Ambulatory Visit: Payer: Self-pay | Admitting: Internal Medicine

## 2015-04-29 ENCOUNTER — Telehealth: Payer: Self-pay | Admitting: Internal Medicine

## 2015-04-29 NOTE — Telephone Encounter (Signed)
Pt called in and said that her clonazePAM (KLONOPIN) 0.5 MG tablet [[id number]]  Is no longer working for her.  She is needs something higher or different.  She is only getting 3 hrs of sleep a night.    Told her that Dr Doug Sou was not in this week

## 2015-04-29 NOTE — Telephone Encounter (Signed)
Called pt and made appt for 7/26

## 2015-04-29 NOTE — Telephone Encounter (Signed)
She will need an acute appointment to be seen by another provider. Dr. Doug Sou is out and in order to be prescribed a replacement med for clonazepam, she will need to be seen.

## 2015-05-03 NOTE — Telephone Encounter (Signed)
Appointment for 7/26 was cancelled reason states patient cancelled because foot is in a cast and can not drive.  Patient states she did not cancel this appointment.  Patient states her son is driving in from [place] to bring her to this appointment and then he is taking her back to [place].  The time slot she had is of course not available now.  She is requesting to be worked in on this day so she can get her med refil.  Please advise.

## 2015-05-07 ENCOUNTER — Ambulatory Visit: Payer: Medicare Other | Admitting: Internal Medicine

## 2015-05-07 ENCOUNTER — Encounter: Payer: Self-pay | Admitting: Internal Medicine

## 2015-05-07 ENCOUNTER — Ambulatory Visit (INDEPENDENT_AMBULATORY_CARE_PROVIDER_SITE_OTHER): Payer: Medicare Other | Admitting: Internal Medicine

## 2015-05-07 VITALS — BP 104/60 | HR 97 | Temp 97.5°F | Ht 66.0 in | Wt 124.8 lb

## 2015-05-07 DIAGNOSIS — G4700 Insomnia, unspecified: Secondary | ICD-10-CM

## 2015-05-07 DIAGNOSIS — Z23 Encounter for immunization: Secondary | ICD-10-CM | POA: Diagnosis not present

## 2015-05-07 DIAGNOSIS — F5104 Psychophysiologic insomnia: Secondary | ICD-10-CM

## 2015-05-07 DIAGNOSIS — F329 Major depressive disorder, single episode, unspecified: Secondary | ICD-10-CM

## 2015-05-07 DIAGNOSIS — Z0000 Encounter for general adult medical examination without abnormal findings: Secondary | ICD-10-CM | POA: Diagnosis not present

## 2015-05-07 DIAGNOSIS — F32A Depression, unspecified: Secondary | ICD-10-CM

## 2015-05-07 MED ORDER — ESCITALOPRAM OXALATE 10 MG PO TABS: 10.0000 mg | ORAL_TABLET | Freq: Every day | ORAL | Status: DC

## 2015-05-07 MED ORDER — TEMAZEPAM 15 MG PO CAPS: 15.0000 mg | ORAL_CAPSULE | Freq: Every evening | ORAL | Status: DC | PRN
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
## 2015-05-07 NOTE — Patient Instructions (Addendum)
It was good to see you today.  Prevnar 13 pneumonia vaccine updated today  We have reviewed your prior records including labs and tests today  Medications reviewed and updated, Resume Lexapro at 10 mg daily dose for underlying anxiety/depression Stop clonazepam Begin generic Restoril for sleep as discussed Your prescription(s) have been submitted to your pharmacy. Please take as directed and contact our office if you believe you are having problem(s) with the medication(s).  we'll make referral to neurology for specialist evaluation of chronic sleep issues and affect of medications on memory function. Our office will contact you regarding appointment(s) once made.  Please schedule followup in 6 months, call sooner if problems.

## 2015-05-07 NOTE — Progress Notes (Signed)
Patient received education resource, including the self-management goal and tool. Patient verbalized understanding. 

## 2015-05-20 ENCOUNTER — Other Ambulatory Visit: Payer: Self-pay | Admitting: Internal Medicine

## 2015-05-29 ENCOUNTER — Telehealth: Payer: Self-pay | Admitting: Internal Medicine

## 2015-05-29 NOTE — Telephone Encounter (Signed)
[facility] called to let you know that patient refused referral.

## 2015-06-28 ENCOUNTER — Ambulatory Visit (INDEPENDENT_AMBULATORY_CARE_PROVIDER_SITE_OTHER): Payer: Medicare Other | Admitting: General Practice

## 2015-06-28 ENCOUNTER — Ambulatory Visit (INDEPENDENT_AMBULATORY_CARE_PROVIDER_SITE_OTHER): Payer: Medicare Other

## 2015-06-28 DIAGNOSIS — Z23 Encounter for immunization: Secondary | ICD-10-CM

## 2015-07-02 DIAGNOSIS — Z23 Encounter for immunization: Secondary | ICD-10-CM | POA: Diagnosis not present

## 2015-07-25 ENCOUNTER — Ambulatory Visit (INDEPENDENT_AMBULATORY_CARE_PROVIDER_SITE_OTHER): Payer: Medicare Other | Admitting: Internal Medicine

## 2015-07-25 ENCOUNTER — Encounter: Payer: Self-pay | Admitting: Internal Medicine

## 2015-07-25 VITALS — BP 108/60 | HR 54 | Temp 98.3°F | Resp 12 | Ht 67.0 in | Wt 127.0 lb

## 2015-07-25 DIAGNOSIS — G4700 Insomnia, unspecified: Secondary | ICD-10-CM | POA: Diagnosis not present

## 2015-07-25 DIAGNOSIS — F5104 Psychophysiologic insomnia: Secondary | ICD-10-CM

## 2015-07-25 MED ORDER — ZALEPLON 5 MG PO CAPS: 5.0000 mg | ORAL_CAPSULE | Freq: Every evening | ORAL | Status: DC | PRN

## 2015-07-25 NOTE — Progress Notes (Signed)
Pre visit review using our clinic review tool, if applicable. No additional management support is needed unless otherwise documented below in the visit note. 

## 2015-07-25 NOTE — Patient Instructions (Signed)
We will try a medicine called sonata for the sleeping. We would like to have you stop the temazepam and the clonazepam as they could be affecting your memory.  It is okay to also take the melatonin with the sleep medicine as this can help you to stay asleep better.   Call us if you are not able to sleep with the new medicine within 1-2 weeks of starting the medicine.

## 2015-07-28 NOTE — Progress Notes (Signed)
   Subjective:    Patient ID: Michele Burns, female    DOB: 1936/07/04, 80 y.o.   MRN: [id number]  HPI The patient is a 79 YO female coming in for insomnia. Since she changed her clonazepam to temazepam she is having more memory troubles remembering names. No confusion or getting lost. Feeling slightly drowsy during the day. Not sleeping well either. Tried going back on clonazepam (had a few left) but this did not help her sleep and she was still having the trouble. Very tired overall getting 2-3 hours of sleep on a good night lately.   Review of Systems  Constitutional: Negative for fever, activity change, appetite change, fatigue and unexpected weight change.  Respiratory: Negative for cough, chest tightness, shortness of breath and wheezing.   Cardiovascular: Negative for chest pain, palpitations and leg swelling.  Gastrointestinal: Negative for abdominal pain, diarrhea, constipation and abdominal distention.  Neurological: Positive for tremors.       Mild  Psychiatric/Behavioral: Positive for sleep disturbance. Negative for hallucinations, confusion, self-injury, dysphoric mood and decreased concentration.      Objective:   Physical Exam  Constitutional: She is oriented to person, place, and time. She appears well-developed and well-nourished.  HENT:  Head: Normocephalic and atraumatic.  Eyes: EOM are normal.  Neck: Normal range of motion.  Cardiovascular: Normal rate and regular rhythm.   Pulmonary/Chest: Effort normal and breath sounds normal. No respiratory distress. She has no wheezes.  Abdominal: Soft. Bowel sounds are normal.  Neurological: She is alert and oriented to person, place, and time. Coordination normal.  Skin: Skin is warm and dry.  Psychiatric: She has a normal mood and affect.   Filed Vitals:   07/25/15 0941  BP: 108/60  Pulse: 54  Temp: 98.3 F (36.8 C)  TempSrc: Oral  Resp: 12  Height: 5\' 7"  (1.702 m)  Weight: 127 lb (57.607 kg)  SpO2: 93%        Assessment & Plan:

## 2015-07-28 NOTE — Assessment & Plan Note (Signed)
Recent change from clonazepam to temazepam (for safety reasons) which she has not done well with. Advised her to stop and not to take the clonazepam either. Rx for sonata which is safer and she can add melatonin over the counter to that safely. Talked to her about the fact that she should not mix temazepam and clonazepam the same night (she had done this a couple of times). If no change to her memory will investigate more next visit.

## 2015-07-30 ENCOUNTER — Telehealth: Payer: Self-pay | Admitting: Internal Medicine

## 2015-07-30 NOTE — Telephone Encounter (Signed)
Pt called in and wanted to know if there is anyway that she could get a high dose of the zaleplon (SONATA) 5 MG capsule [[id number]]  She said that it is not strong enough.

## 2015-07-30 NOTE — Telephone Encounter (Signed)
She can try taking 2 of them at night time but this is the highest safe dose. She can call back if it is working and we can change her prescription.

## 2015-07-30 NOTE — Telephone Encounter (Signed)
Left message informing patient she can take two at night and to call us back to let us know how its working.

## 2015-08-02 NOTE — Telephone Encounter (Signed)
Pt left msg on triage stating md to her to call to let her know that taking two of the Zaleplon is working for her.../lmb.

## 2015-08-06 MED ORDER — ZALEPLON 10 MG PO CAPS: 10.0000 mg | ORAL_CAPSULE | Freq: Every evening | ORAL | Status: DC | PRN

## 2015-08-06 NOTE — Telephone Encounter (Signed)
zaleplon (SONATA) 5 MG capsule [[id number]] Patient called to advise that the 2 pills / day did work. She is asking for the script to be updated and sent in again.

## 2015-08-06 NOTE — Telephone Encounter (Signed)
Rx printed for 10 mg pills so take 1 with bedtime.

## 2015-08-06 NOTE — Addendum Note (Signed)
Addended by: Pricilla Holm A on: 08/06/2015 04:35 PM   Modules accepted: Orders

## 2015-08-06 NOTE — Telephone Encounter (Signed)
Sent to pharmacy 

## 2015-08-13 ENCOUNTER — Telehealth: Payer: Self-pay | Admitting: *Deleted

## 2015-08-13 ENCOUNTER — Other Ambulatory Visit: Payer: Self-pay | Admitting: Internal Medicine

## 2015-08-13 NOTE — Telephone Encounter (Signed)
Left msg on triage stating she is needing updated rx for her Zalepon. She was taking 5 mg, but was told by nurse to take (2) pills, and a new rx will be sent to pharmacy. Inform pt per chart md sent new rx for Zalepon 10 mg on 08/06/15. She states the pharmacy keep saying its not time to fill. Inform pt i will call [facility] to see what the problem & give her a call back....../LMB

## 2015-08-13 NOTE — Telephone Encounter (Signed)
Called pharmacy spoke with pharmacist Pamalee Leyden verified if they received updated script for Zalepon on 08/06/15. She stated no they did not. Gave verbal authorization for Zalepon 10 mg & to void the 5 mg which they had a file. Called pt inform her status and she should be able to get today...[name]

## 2015-08-22 ENCOUNTER — Other Ambulatory Visit: Payer: Self-pay | Admitting: Internal Medicine

## 2015-08-23 ENCOUNTER — Other Ambulatory Visit: Payer: Self-pay | Admitting: Internal Medicine

## 2015-11-05 ENCOUNTER — Telehealth: Payer: Self-pay | Admitting: Internal Medicine

## 2015-11-05 MED ORDER — ZALEPLON 10 MG PO CAPS: 10.0000 mg | ORAL_CAPSULE | Freq: Every evening | ORAL | Status: DC | PRN

## 2015-11-05 NOTE — Telephone Encounter (Signed)
New rx printed and signed  

## 2015-11-05 NOTE — Telephone Encounter (Signed)
Faxed to pharmacy

## 2015-11-05 NOTE — Telephone Encounter (Signed)
Pt's insurance has switched her pharmacy to [facility] on [address] She states they won't fill her zaleplon (SONATA) 10 MG capsule [id number] until the 27th.  She says she only takes 1 pill a day and she has come up short by 2 pills.  She's wondering if you would just write a whole new prescription or whatever you feel is necessary.

## 2015-11-08 ENCOUNTER — Other Ambulatory Visit: Payer: Self-pay

## 2015-11-08 MED ORDER — SIMVASTATIN 40 MG PO TABS: ORAL_TABLET | ORAL | Status: DC

## 2015-11-08 MED ORDER — LOSARTAN POTASSIUM 50 MG PO TABS: ORAL_TABLET | ORAL | Status: DC

## 2015-12-06 ENCOUNTER — Telehealth: Payer: Self-pay | Admitting: *Deleted

## 2015-12-06 NOTE — Telephone Encounter (Signed)
Received call pt states due to insurance change they have to use [facility], and [facility] will not transfer Zalepon refills over. Inform pt will contact [facility] and give verbally. Called [facility] spoke with pharmacist Verdis Frederickson gave her remaining refills on Zalepon...Michele Burns

## 2015-12-09 NOTE — Progress Notes (Signed)
Cardiology Office Note   Date:  12/10/2015   ID:  WILMOTH CLOHESSY, DOB 01-17-1936, MRN [id number]  PCP:  Hoyt Koch, MD  Cardiologist: Dr. Burt Knack   CC: exertional chest pain    History of Present Illness: Michele Burns is a 80 y.o. female with a history of CAD s/p overlapping DESx2 to RCA (2014), HTN and HLD who presents to clinic for evaluation of chest pain.   She underwent PCI of the RCA in 11/2012 after presenting with symptoms of unstable angina. She was treated with overlapping drug-eluting stents and adjunctive rotational atherectomy of the RCA. Her left ventricular ejection fraction was 55-60%.  She was last seen by Dr. Burt Knack in 02/2015. He recommended a stress ECHO which returned normal with excellent exercise capacity.   Today she presents to clinic for evaluation of exertional chest pain. She did have foot surgery in 04/2015 and was not able to work out.  She was able to get back to her normal workout routine in October. She had no issues with chest pain or SOB. She has been working part time in a Dentist as well. Starting at the end of November, she started noticing chest pressure with radiation into her left jaw with walking on a treadmill. It does resolve with rest. Yesterday, she was able to walk without symptoms. The last time she had chest tightness a couple weeks ago, but she did get worried and wanted to be evaluated. She denies shortness of breath, orthopnea or PND. No dizziness or syncope. No palpitations. No blood in her stool or urine.  Additionally, she feels like she is loosing her memory. Things like remembering names has been more difficult for her lately. She also has been having severe ringing in her ears. She has never seen a neurologist before.    Past Medical History  Diagnosis Date  . Hx of colonic polyp 2006    diminutive '06, no polyps on 4/09 colo - for repeat 4/14  . IBS (irritable bowel syndrome)   . Erosive gastritis 2006     NSAID precipitated, resolved on f/u EGD  . MIGRAINE HEADACHE   . PULMONARY NODULE   . PVD     normal ABI/TBI 09/2009, chronic L toe pain and vasospasm ([place])  . POSTMENOPAUSAL SYNDROME   . RENAL CYST   . Hypertension   . Hyperlipidemia   . DEPRESSION   . Coronary artery disease     a. Cath 11/17/12 - severe Ca++ moderately severe disease in the mid RCA, otherwise nonobstructive disease - s/p PTCA/rotational atherectomy & 2 DES to mid RCA 11/22/12.  . Sinus bradycardia     a. not on BB due to this.  . 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 Hx of adenomatous colonic polyps 02/10/2015    Past Surgical History  Procedure Laterality Date  . Tonsillectomy      As a child  . Vaginal hysterectomy    . Excision morton's neuroma  1971    L 3/4 toe space  . Artherectomy  11/22/2012    RCA  . Abdominal hysterectomy    . Tonsillectomy    . Esophagogastroduodenoscopy    . Colonoscopy    . Ptca    . Laparoscopic right hemi colectomy  06/29/2014  . Laparotomy N/A 06/29/2014    Procedure: EXPLORATORY LAPAROTOMY;  Surgeon: Stark Klein, MD;  Location: [facility];  Service: General;  Laterality: N/A;  . Partial colectomy N/A 06/29/2014    Procedure: PARTIAL  COLECTOMY;  Surgeon: Stark Klein, MD;  Location: [facility];  Service: General;  Laterality: N/A;  . Left heart catheterization with coronary angiogram N/A 11/17/2012    Procedure: LEFT HEART CATHETERIZATION WITH CORONARY ANGIOGRAM;  Surgeon: Peter M Martinique, MD;  Location: [facility] CATH LAB;  Service: Cardiovascular;  Laterality: N/A;  . Percutaneous coronary stent intervention (pci-s) N/A 11/22/2012    Procedure: PERCUTANEOUS CORONARY STENT INTERVENTION (PCI-S);  Surgeon: Peter M Martinique, MD;  Location: [facility] CATH LAB;  Service: Cardiovascular;  Laterality: N/A;  . Percutaneous coronary rotoblator intervention (pci-r)  11/22/2012    Procedure: PERCUTANEOUS CORONARY ROTOBLATOR INTERVENTION (PCI-R);  Surgeon: Peter M Martinique, MD;  Location: [facility] CATH LAB;  Service:  Cardiovascular;;     Current Outpatient Prescriptions  Medication Sig Dispense Refill  . aspirin EC 81 MG tablet Take 162 mg by mouth daily.     Marland Kitchen escitalopram (LEXAPRO) 10 MG tablet TAKE 1 TABLET (10 MG TOTAL) BY MOUTH DAILY. 90 tablet 1  . losartan (COZAAR) 50 MG tablet TAKE 1 BY MOUTH DAILY 90 tablet 0  . simvastatin (ZOCOR) 40 MG tablet TAKE 1 BY MOUTH AT BEDTIME 90 tablet 0  . zaleplon (SONATA) 10 MG capsule Take 1 capsule (10 mg total) by mouth at bedtime as needed for sleep. 30 capsule 3   No current facility-administered medications for this visit.    Allergies:   Codeine    Social History:  The patient  reports that she quit smoking about 35 years ago. Her smoking use included Cigarettes. She quit after 30 years of use. She has never used smokeless tobacco. She reports that she drinks about 6.0 oz of alcohol per week. She reports that she does not use illicit drugs.   Family History:  The patient's *family history includes Diabetes in her mother and paternal grandmother; Diabetes type II in her mother; Heart attack (age of onset: 12) in her brother; Heart attack (age of onset: 66) in her father; Heart attack (age of onset: 48) in her mother. There is no history of Colon cancer, Colon polyps, Kidney disease, Esophageal cancer, Gallbladder disease, Hypertension, or Stroke.    ROS:  Please see the history of present illness.   Otherwise, review of systems are positive for none.   All other systems are reviewed and negative.    PHYSICAL EXAM: VS:  BP 110/60 mmHg  Pulse 60  Ht 5\' 7"  (1.702 m)  Wt 125 lb 6.4 oz (56.881 kg)  BMI 19.64 kg/m2 , BMI Body mass index is 19.64 kg/(m^2). GEN: Well nourished, well developed, in no acute distress HEENT: normal Neck: no JVD, carotid bruits, or masses Cardiac: bradycardia; no murmurs, rubs, or gallops,no edema  Respiratory:  clear to auscultation bilaterally, normal work of breathing GI: soft, nontender, nondistended, + BS MS: no  deformity or atrophy Skin: warm and dry, no rash Neuro:  Strength and sensation are intact Psych: euthymic mood, full affect   EKG:  EKG is ordered today. The ekg ordered today demonstrates sinus brady LAFB, q waves in V1-V2   Recent Labs: No results found for requested labs within last 365 days.    Lipid Panel    Component Value Date/Time   CHOL 168 12/04/2014 1222   TRIG 39.0 12/04/2014 1222   TRIG 78 04/17/2009   HDL 89.90 12/04/2014 1222   CHOLHDL 2 12/04/2014 1222   VLDL 7.8 12/04/2014 1222   LDLCALC 70 12/04/2014 1222      Wt Readings from Last 3 Encounters:  12/10/15  125 lb 6.4 oz (56.881 kg)  07/25/15 127 lb (57.607 kg)  05/07/15 124 lb 12 oz (56.586 kg)     Cardiac Studies Reviewed: Cardiac catheterization 11/17/2012: Procedural Findings: Hemodynamics: AO 163/95 with a mean of 171 mmHg LV 163/15 mmHg  Coronary angiography: Coronary dominance: right  Left mainstem: The left main coronary is mildly calcified in the distal vessel. It has no significant obstructive disease.  Left anterior descending (LAD): The LAD is heavily calcified in the proximal and mid vessel. There is 30% disease at the ostium. In the mid vessel at the takeoff of the second diagonal there is 40% disease in the LAD. The first diagonal is without significant disease. The second diagonal has 40-50% disease at its origin.  Left circumflex (LCx): The left circumflex is a large vessel that gives rise to 2 marginal branches in the mid vessel. There is 30-40% disease at the bifurcation of the marginals.  Right coronary artery (RCA): The right coronary has a downward takeoff that is somewhat out of plane. After a great deal of difficulty we were able to engage it with a multipurpose 1 guide catheter. The right coronary is severely calcified in the proximal, mid, and distal vessel. There is a long segmental 80% stenosis in the mid vessel followed by a segmental 80% stenosis at the crux.  Left  ventriculography: Left ventricular systolic function is normal, LVEF is estimated at 55-60%, there is mild anterior wall hypokinesis, there is no significant mitral regurgitation   Final Conclusions:  1. Single vessel obstructive coronary disease. The right coronary has severe calcification and moderately severe disease in the mid vessel and crux. 2. Good left ventricular function.  Recommendations: All reviewed treatment options with the patient including medical therapy versus percutaneous intervention. Percutaneous intervention would be difficult and rotational atherectomy would be necessary to achieve optimal result.    Stress ECHO: 03/04/2015 Study Conclusions - Stress ECG conclusions: There were no stress arrhythmias or conduction abnormalities. The stress ECG was normal. - Staged echo: There was no echocardiographic evidence for stress-induced ischemia. Impressions: - Normal stress echocardiogram. Excellent exercise capacity. Normal BP response.   ASSESSMENT AND PLAN: Michele Burns is a 80 y.o. female with a history of CAD s/p overlapping DESx2 to RCA (2014), HTN and HLD who presents to clinic for evaluation of chest pain.   CAD/chest pain: The patient has diffuse coronary artery disease based on review of her most recent heart catheterization. She has undergone complex PCI of the right coronary artery. There is 40-50% stenosis of the LAD present. She had a normal stress echo in 02/2015. She has been having exertional chest pain recently. I will get an ETT Myoview for further evaluation.  -- Continue ASA and stain. No BB due to sinus bradycardia.   HTN. Blood pressure is well controlled on losartan.  HLD. Continue statin. Lipids followed by PCP.  Memory loss/Tinnitus: will set up a neurology referal  Current medicines are reviewed at length with the patient today.  The patient has concerns regarding medicines.  The following changes have been made:  no  change  Labs/ tests ordered today include:   Orders Placed This Encounter  Procedures  . Ambulatory referral to Neurology  . Myocardial Perfusion Imaging  . EKG 12-Lead     Disposition:   FU with Dr. Burt Knack in 3 months or earlier if stress test abnormal   Signed, Eileen Stanford, PA-C  12/10/2015 3:03 PM    [facility] [id number]  [address] [place], [place]  [address] Phone: [phone]; Fax: [phone]

## 2015-12-10 ENCOUNTER — Ambulatory Visit (INDEPENDENT_AMBULATORY_CARE_PROVIDER_SITE_OTHER): Payer: Medicare Other | Admitting: Physician Assistant

## 2015-12-10 ENCOUNTER — Encounter: Payer: Self-pay | Admitting: Physician Assistant

## 2015-12-10 VITALS — BP 110/60 | HR 60 | Ht 67.0 in | Wt 125.4 lb

## 2015-12-10 DIAGNOSIS — R413 Other amnesia: Secondary | ICD-10-CM

## 2015-12-10 DIAGNOSIS — R079 Chest pain, unspecified: Secondary | ICD-10-CM | POA: Diagnosis not present

## 2015-12-10 DIAGNOSIS — H9319 Tinnitus, unspecified ear: Secondary | ICD-10-CM | POA: Diagnosis not present

## 2015-12-10 NOTE — Patient Instructions (Addendum)
Medication Instructions:  Your physician recommends that you continue on your current medications as directed. Please refer to the Current Medication list given to you today.  Labwork: NONE  Testing/Procedures: Your physician has requested that you have en exercise stress myoview. For further information please visit [email or web address]. Please follow instruction sheet, as given.  Follow-Up: Your physician recommends that you schedule a follow-up appointment in: 3 months with Dr. Burt Knack or sooner if stress test is abnormal.  You have been referred to neurology for memory loss and tinnitus (ringing in the ears)  If you need a refill on your cardiac medications before your next appointment, please call your pharmacy.

## 2015-12-12 ENCOUNTER — Encounter: Payer: Self-pay | Admitting: Internal Medicine

## 2015-12-12 ENCOUNTER — Ambulatory Visit (INDEPENDENT_AMBULATORY_CARE_PROVIDER_SITE_OTHER): Payer: Medicare Other | Admitting: Internal Medicine

## 2015-12-12 VITALS — BP 110/70 | HR 60 | Ht 63.75 in | Wt 126.4 lb

## 2015-12-12 DIAGNOSIS — H25013 Cortical age-related cataract, bilateral: Secondary | ICD-10-CM | POA: Diagnosis not present

## 2015-12-12 DIAGNOSIS — R143 Flatulence: Secondary | ICD-10-CM

## 2015-12-12 DIAGNOSIS — H2513 Age-related nuclear cataract, bilateral: Secondary | ICD-10-CM | POA: Diagnosis not present

## 2015-12-12 NOTE — Patient Instructions (Signed)
   Look at the gas prevention and FODMAPS diets and try to eliminate foods on them that you do eat.  Can stop Lactaid and Culturelle.  Try Beano.  Call back and leave me a message if these do not help.  I appreciate the opportunity to care for you. Gatha Mayer, MD, [facility]

## 2015-12-12 NOTE — Progress Notes (Signed)
   Subjective:    Patient ID: Michele Burns, female    DOB: [birth year], 80 y.o.   MRN: [id number] Chief Complaint: Gas HPI     The patient is here for follow-up. She is extremely frustrated by flatulence problems. She says it's malodorous and embarrassing. There is no abdominal pain. She has avoided milk products, she has tried Lactaid she has used culture L. Nothing seems to help. No new medications. Medications, allergies, past medical history, past surgical history, family history and social history are reviewed and updated in the EMR.  Review of Systems As above    Objective:   Physical Exam BP 110/70 mmHg  Pulse 60  Ht 5' 3.75" (1.619 m)  Wt 126 lb 6 oz (57.323 kg)  BMI 21.87 kg/m2 Well-developed well-nourished no acute distress looking younger than stated age     Assessment & Plan:   1. Flatulence    I explained to her that there are multiple possible causes of this. Diet can be part of it. I suggested she try Beano. I've given her gas and flatulence prevention and a FODMAPS diet. She seems to think she avoids most of the foods on these lists. She will try Beano to see if that makes a difference. I think she can stop a culture L and the Lactaid since that is not helping. We'll call back if the Beano is unaffected we will consider empiric antibiotics like metronidazole versus breath testing.  15 minutes time spent with patient > half in counseling coordination of care

## 2015-12-19 ENCOUNTER — Encounter: Payer: Self-pay | Admitting: Internal Medicine

## 2015-12-19 ENCOUNTER — Telehealth: Payer: Self-pay

## 2015-12-19 ENCOUNTER — Other Ambulatory Visit (INDEPENDENT_AMBULATORY_CARE_PROVIDER_SITE_OTHER): Payer: Medicare Other

## 2015-12-19 ENCOUNTER — Ambulatory Visit (INDEPENDENT_AMBULATORY_CARE_PROVIDER_SITE_OTHER): Payer: Medicare Other | Admitting: Internal Medicine

## 2015-12-19 VITALS — BP 138/68 | HR 57 | Temp 98.4°F | Resp 12 | Ht 66.0 in | Wt 128.0 lb

## 2015-12-19 DIAGNOSIS — F5104 Psychophysiologic insomnia: Secondary | ICD-10-CM

## 2015-12-19 DIAGNOSIS — Z0000 Encounter for general adult medical examination without abnormal findings: Secondary | ICD-10-CM

## 2015-12-19 DIAGNOSIS — G4700 Insomnia, unspecified: Secondary | ICD-10-CM

## 2015-12-19 LAB — CBC
HCT: 38.1 % (ref 36.0–46.0)
Hemoglobin: 13.1 g/dL (ref 12.0–15.0)
MCHC: 34.3 g/dL (ref 30.0–36.0)
MCV: 92.4 fl (ref 78.0–100.0)
PLATELETS: 201 10*3/uL (ref 150.0–400.0)
RBC: 4.12 Mil/uL (ref 3.87–5.11)
RDW: 13.5 % (ref 11.5–15.5)
WBC: 5.1 10*3/uL (ref 4.0–10.5)

## 2015-12-19 LAB — COMPREHENSIVE METABOLIC PANEL
ALBUMIN: 4.6 g/dL (ref 3.5–5.2)
ALK PHOS: 38 U/L — AB (ref 39–117)
ALT: 17 U/L (ref 0–35)
AST: 21 U/L (ref 0–37)
BILIRUBIN TOTAL: 1 mg/dL (ref 0.2–1.2)
BUN: 24 mg/dL — ABNORMAL HIGH (ref 6–23)
CALCIUM: 10 mg/dL (ref 8.4–10.5)
CO2: 31 meq/L (ref 19–32)
Chloride: 102 mEq/L (ref 96–112)
Creatinine, Ser: 0.95 mg/dL (ref 0.40–1.20)
GFR: 60.51 mL/min (ref >60.00)
Glucose, Bld: 89 mg/dL (ref 70–99)
Potassium: 4.7 mEq/L (ref 3.5–5.1)
Sodium: 140 mEq/L (ref 135–145)
Total Protein: 7.2 g/dL (ref 6.0–8.3)

## 2015-12-19 LAB — LIPID PANEL
CHOL/HDL RATIO: 2
Cholesterol: 180 mg/dL (ref 0–200)
HDL: 98.5 mg/dL (ref >39.00)
LDL Cholesterol: 71 mg/dL (ref 0–99)
NonHDL: 81.29
TRIGLYCERIDES: 52 mg/dL (ref 0.0–149.0)
VLDL: 10.4 mg/dL (ref 0.0–40.0)

## 2015-12-19 MED ORDER — ESCITALOPRAM OXALATE 20 MG PO TABS: 20.0000 mg | ORAL_TABLET | Freq: Every day | ORAL | Status: DC

## 2015-12-19 MED ORDER — ESZOPICLONE 2 MG PO TABS: 2.0000 mg | ORAL_TABLET | Freq: Every evening | ORAL | Status: DC | PRN

## 2015-12-19 NOTE — Telephone Encounter (Signed)
PA initiated via CoverMyMeds Key [phone]  Covered alternative Silenor

## 2015-12-19 NOTE — Progress Notes (Signed)
Pre visit review using our clinic review tool, if applicable. No additional management support is needed unless otherwise documented below in the visit note. 

## 2015-12-19 NOTE — Patient Instructions (Addendum)
We have faxed in a prescription for lunesta to the pharmacy that you can try to see if it works better than the sonata (zalepton).  We will check the labs and call you back with the results.   We would like you to come in for a wellness visit with our health coach Manuela Schwartz sometime before the next visit to make sure we are not missing anything for your health.

## 2015-12-20 ENCOUNTER — Ambulatory Visit (HOSPITAL_COMMUNITY): Payer: Medicare Other | Attending: Cardiology

## 2015-12-20 DIAGNOSIS — I739 Peripheral vascular disease, unspecified: Secondary | ICD-10-CM | POA: Insufficient documentation

## 2015-12-20 DIAGNOSIS — R079 Chest pain, unspecified: Secondary | ICD-10-CM | POA: Insufficient documentation

## 2015-12-20 DIAGNOSIS — Z8249 Family history of ischemic heart disease and other diseases of the circulatory system: Secondary | ICD-10-CM | POA: Insufficient documentation

## 2015-12-20 LAB — MYOCARDIAL PERFUSION IMAGING
CHL CUP MPHR: 143 {beats}/min
CHL CUP NUCLEAR SRS: 7
CHL CUP NUCLEAR SSS: 9
CSEPHR: 90 %
CSEPPHR: 130 {beats}/min
Estimated workload: 10.1 METS
Exercise duration (min): 9 min
Exercise duration (sec): 0 s
LHR: 0.35
LVDIAVOL: 88 mL (ref 46–106)
LVSYSVOL: 37 mL
NUC STRESS TID: 0.96
Rest HR: 48 {beats}/min
SDS: 2

## 2015-12-20 MED ORDER — TECHNETIUM TC 99M SESTAMIBI GENERIC - CARDIOLITE
11.0000 | Freq: Once | INTRAVENOUS | Status: AC | PRN
  Administered 2015-12-20: 11 via INTRAVENOUS

## 2015-12-20 MED ORDER — TECHNETIUM TC 99M SESTAMIBI GENERIC - CARDIOLITE
32.0000 | Freq: Once | INTRAVENOUS | Status: AC | PRN
  Administered 2015-12-20: 32 via INTRAVENOUS

## 2015-12-20 NOTE — Telephone Encounter (Signed)
PA Lunesta denied, covered alternative is Silenor, please advise

## 2015-12-20 NOTE — Telephone Encounter (Signed)
silenor is not a good option for this patient.

## 2015-12-22 NOTE — Assessment & Plan Note (Signed)
Rx for lunesta to see if this works better.

## 2015-12-22 NOTE — Progress Notes (Signed)
   Subjective:    Patient ID: Michele Burns, female    DOB: [birth year], 80 y.o.   MRN: [id number]  HPI The patient is a 80 YO female coming in for her insomnia. She has tried the sonata which helps some but she is not able to sleep for a long time. Maybe 4 hours a night is all. She would be willing to try a different option. She denies excessive sleepiness but does feel tired during the day.   Review of Systems  Constitutional: Negative for fever, activity change, appetite change, fatigue and unexpected weight change.  Respiratory: Negative for cough, chest tightness, shortness of breath and wheezing.   Cardiovascular: Negative for chest pain, palpitations and leg swelling.  Gastrointestinal: Negative for abdominal pain, diarrhea, constipation and abdominal distention.  Neurological: Positive for tremors.       Mild  Psychiatric/Behavioral: Positive for sleep disturbance. Negative for hallucinations, confusion, self-injury, dysphoric mood and decreased concentration.      Objective:   Physical Exam  Constitutional: She is oriented to person, place, and time. She appears well-developed and well-nourished.  HENT:  Head: Normocephalic and atraumatic.  Eyes: EOM are normal.  Neck: Normal range of motion.  Cardiovascular: Normal rate and regular rhythm.   Pulmonary/Chest: Effort normal and breath sounds normal. No respiratory distress. She has no wheezes.  Abdominal: Soft. Bowel sounds are normal.  Neurological: She is alert and oriented to person, place, and time. Coordination normal.  Skin: Skin is warm and dry.  Psychiatric: She has a normal mood and affect.   Filed Vitals:   12/19/15 1119  BP: 138/68  Pulse: 57  Temp: 98.4 F (36.9 C)  TempSrc: Oral  Resp: 12  Height: 5\' 6"  (1.676 m)  Weight: 128 lb (58.06 kg)  SpO2: 98%      Assessment & Plan:

## 2015-12-24 NOTE — Telephone Encounter (Signed)
Appeal initiated via [phone]

## 2016-01-02 ENCOUNTER — Telehealth: Payer: Self-pay | Admitting: Neurology

## 2016-01-02 ENCOUNTER — Ambulatory Visit (INDEPENDENT_AMBULATORY_CARE_PROVIDER_SITE_OTHER): Payer: Medicare Other | Admitting: Neurology

## 2016-01-02 ENCOUNTER — Other Ambulatory Visit (INDEPENDENT_AMBULATORY_CARE_PROVIDER_SITE_OTHER): Payer: Medicare Other

## 2016-01-02 ENCOUNTER — Encounter: Payer: Self-pay | Admitting: Neurology

## 2016-01-02 ENCOUNTER — Other Ambulatory Visit: Payer: Self-pay | Admitting: Neurology

## 2016-01-02 VITALS — BP 128/78 | HR 62 | Ht 66.0 in | Wt 127.2 lb

## 2016-01-02 DIAGNOSIS — G3184 Mild cognitive impairment, so stated: Secondary | ICD-10-CM

## 2016-01-02 DIAGNOSIS — R292 Abnormal reflex: Secondary | ICD-10-CM

## 2016-01-02 DIAGNOSIS — M62838 Other muscle spasm: Secondary | ICD-10-CM

## 2016-01-02 DIAGNOSIS — M6249 Contracture of muscle, multiple sites: Secondary | ICD-10-CM | POA: Diagnosis not present

## 2016-01-02 DIAGNOSIS — H9313 Tinnitus, bilateral: Secondary | ICD-10-CM | POA: Diagnosis not present

## 2016-01-02 DIAGNOSIS — E538 Deficiency of other specified B group vitamins: Secondary | ICD-10-CM

## 2016-01-02 NOTE — Progress Notes (Signed)
[facility] Clinic Note - Initial Visit   Date: 01/02/2016  Michele Burns MRN: [id number] DOB: 11-29-1935   Dear Dr. Sharlet Salina:  Thank you for your kind referral of Michele Burns for consultation of memory changes. Although her history is well known to you, please allow [place] to reiterate it for the purpose of our medical record. The patient was accompanied to the clinic by self.    History of Present Illness: Michele Burns is a 80 y.o. right-handed Caucasian female with CAD, depression, hyperlipemia, and insomnia presenting for evaluation of memory loss.    MEMORY LOSS.  Around the fall 2016, she began noticing that she was having word-finding difficulty.  The other day, she was talking on the phone with her daughter and could not remember the word "yoga" or unable to remember which building her son lived in.  She denies problems with memory.  She lives alone and manages all her own ADLs and IADLs.  She drives and has not been involved in any MVAs.  She stays very active and goes to the gym several times per week and still works part-time.  She denies any neck or back pain, incontinence, or behavior changes.   Dementia questions Memory Are you repeating things excessively? No Are you forgetting important details of conversations/events? No Are you prone to misplacing items more now than in the past? No Can you tell me about some recent headlines?  [place] terrorist, Trump Trump and more [employer] Are you having trouble managing financial matters?  No Are you taking your medications regularly w/o prompting? Yes  Can you organize and prepare a large holiday meal for multiple people? Yes Can you multitask effectively?  Yes  Language Do you have any word finding difficulties?  No Do you have trouble following instructions or a conversation?  No Have you been avoiding reading or writing due to problems with recognition or remembering words?  No, reads a lot Are you using generalities when speaking because of memory trouble?  No  Visuospatial Are you getting lost while driving? No Are you getting turned around in your own home? No Do you have trouble recognizing familiar faces/family members/close friends?  No Do you have trouble dressing, putting on cloths? No  TINNITUS.  Starting around October 2016, she began noticing bilateral buzzing noise in her hear, which is more noticeable when resting to sleep. She denies headache, hearing loss, ear pain, vision change, numbness/tingling, or imbalance.  She has not seen ENT.     Out-side paper records, electronic medical record, and images have been reviewed where available and summarized as:  Lab Results  Component Value Date   VITAMINB12 238 12/04/2014   Lab Results  Component Value Date   TSH 2.33 12/04/2014   Lab Results  Component Value Date   CKTOTAL 52 12/04/2014     Past Medical History  Diagnosis Date  . Hx of colonic polyp 2006    diminutive '06, no polyps on 4/09 colo - for repeat 4/14  . IBS (irritable bowel syndrome)   . Erosive gastritis 2006    NSAID precipitated, resolved on f/u EGD  . MIGRAINE HEADACHE   . PULMONARY NODULE   . PVD     normal ABI/TBI 09/2009, chronic L toe pain and vasospasm (Raynauds)  . POSTMENOPAUSAL SYNDROME   . RENAL CYST   . Hypertension   . Hyperlipidemia   . DEPRESSION   . Coronary artery disease  a. Cath 11/17/12 - severe Ca++ moderately severe disease in the mid RCA, otherwise nonobstructive disease - s/p PTCA/rotational atherectomy & 2 DES to mid RCA 11/22/12.  . Sinus bradycardia     a. not on BB due to this.  . Cecal volvulus ([place]) 06/29/2014  . Hx of adenomatous colonic polyps 02/10/2015    Past Surgical History  Procedure Laterality Date  . Tonsillectomy      As a child  . Vaginal hysterectomy    . Excision morton's neuroma  1971    L 3/4 toe space  . Artherectomy  11/22/2012    RCA  . Abdominal hysterectomy     . Tonsillectomy    . Esophagogastroduodenoscopy    . Colonoscopy    . Ptca    . Laparoscopic right hemi colectomy  06/29/2014  . Laparotomy N/A 06/29/2014    Procedure: EXPLORATORY LAPAROTOMY;  Surgeon: Stark Klein, MD;  Location: [place];  Service: General;  Laterality: N/A;  . Partial colectomy N/A 06/29/2014    Procedure: PARTIAL COLECTOMY;  Surgeon: Stark Klein, MD;  Location: [place];  Service: General;  Laterality: N/A;  . Left heart catheterization with coronary angiogram N/A 11/17/2012    Procedure: LEFT HEART CATHETERIZATION WITH CORONARY ANGIOGRAM;  Surgeon: Peter M Martinique, MD;  Location: [facility] CATH LAB;  Service: Cardiovascular;  Laterality: N/A;  . Percutaneous coronary stent intervention (pci-s) N/A 11/22/2012    Procedure: PERCUTANEOUS CORONARY STENT INTERVENTION (PCI-S);  Surgeon: Peter M Martinique, MD;  Location: [facility] CATH LAB;  Service: Cardiovascular;  Laterality: N/A;  . Percutaneous coronary rotoblator intervention (pci-r)  11/22/2012    Procedure: PERCUTANEOUS CORONARY ROTOBLATOR INTERVENTION (PCI-R);  Surgeon: Peter M Martinique, MD;  Location: [facility] CATH LAB;  Service: Cardiovascular;;     Medications:  Outpatient Encounter Prescriptions as of 01/02/2016  Medication Sig  . aspirin EC 81 MG tablet Take 162 mg by mouth daily.   Marland Kitchen escitalopram (LEXAPRO) 20 MG tablet Take 1 tablet (20 mg total) by mouth daily.  . eszopiclone (LUNESTA) 2 MG TABS tablet Take 1 tablet (2 mg total) by mouth at bedtime as needed for sleep. Take immediately before bedtime  . losartan (COZAAR) 50 MG tablet TAKE 1 BY MOUTH DAILY  . simvastatin (ZOCOR) 40 MG tablet TAKE 1 BY MOUTH AT BEDTIME  . zaleplon (SONATA) 10 MG capsule Take 1 capsule (10 mg total) by mouth at bedtime as needed for sleep.   No facility-administered encounter medications on file as of 01/02/2016.     Allergies:  Allergies  Allergen Reactions  . Codeine     REACTION: severe nausea    Family History: Family History  Problem Relation  Age of Onset  . Heart attack Father 24    deceased  . Heart attack Mother 34    deceased  . Diabetes type II Mother   . Diabetes Mother   . Heart attack Brother 21    deceased  . Diabetes Paternal Grandmother     IDDM  . Colon cancer Neg Hx   . Colon polyps Neg Hx   . Kidney disease Neg Hx   . Esophageal cancer Neg Hx   . Gallbladder disease Neg Hx   . Hypertension Neg Hx   . Stroke Neg Hx   . Healthy Son     x 2  . Healthy Daughter     Social History: Social History  Substance Use Topics  . Smoking status: Former Smoker -- 30 years    Types: Cigarettes  Quit date: 10/12/1980  . Smokeless tobacco: Never Used     Comment: She is widowed since 2002-she has 3 grown children and 5 g-kids. Moved to [place] from [place] in 2010 to be close to family  . Alcohol Use: 6.0 oz/week    10 Glasses of wine per week     Comment: <2 /day, white wine twice daily   Social History   Social History Narrative   Lives alone in a one story home. Husband passed away 15 year ago. Has 3 children.     Works part time in [employer] (medical).     Education: 2 years of college.     Review of Systems:  CONSTITUTIONAL: No fevers, chills, night sweats, or weight loss.   EYES: No visual changes or eye pain ENT: No hearing changes.  No history of nose bleeds.   RESPIRATORY: No cough, wheezing and shortness of breath.   CARDIOVASCULAR: Negative for chest pain, and palpitations.   GI: Negative for abdominal discomfort, blood in stools or black stools.  No recent change in bowel habits.   GU:  No history of incontinence.   MUSCLOSKELETAL: No history of joint pain or swelling.  No myalgias.   SKIN: Negative for lesions, rash, and itching.   HEMATOLOGY/ONCOLOGY: Negative for prolonged bleeding, bruising easily, and swollen nodes.  No history of cancer.   ENDOCRINE: Negative for cold or heat intolerance, polydipsia or goiter.   PSYCH:  +depression or anxiety symptoms.   NEURO: As Above.   Vital Signs:  BP  128/78 mmHg  Pulse 62  Ht 5\' 6"  (1.676 m)  Wt 127 lb 4 oz (57.72 kg)  BMI 20.55 kg/m2  SpO2 95% Pain Scale: 0 on a scale of 0-10 Montreal Cognitive Assessment  01/02/2016  Visuospatial/ Executive (0/5) 2  Naming (0/3) 2  Attention: Read list of digits (0/2) 2  Attention: Read list of letters (0/1) 1  Attention: Serial 7 subtraction starting at 100 (0/3) 1  Language: Repeat phrase (0/2) 2  Language : Fluency (0/1) 0  Abstraction (0/2) 1  Delayed Recall (0/5) 0  Orientation (0/6) 6  Total 17  Adjusted Score (based on education) 17     General Medical Exam:   General:  Well appearing, comfortable.   Eyes/ENT: see cranial nerve examination.   Neck: No masses appreciated.  Full range of motion without tenderness.  No carotid bruits. Respiratory:  Clear to auscultation, good air entry bilaterally.   Cardiac:  Regular rate and rhythm, no murmur.   Extremities:  No deformities, edema, or skin discoloration.  Skin:  No rashes or lesions.  Neurological Exam: MENTAL STATUS including orientation to time, place, person, recent and remote memory, attention span and concentration, language, and fund of knowledge is normal.  Speech is not dysarthric.  CRANIAL NERVES: II:  No visual field defects.  Unremarkable fundi.   III-IV-VI: Pupils equal round and reactive to light.  Normal conjugate, extra-ocular eye movements in all directions of gaze.  No nystagmus.  No ptosis.   V:  Normal facial sensation.  Jaw jerk is absent.   VII:  Normal facial symmetry and movements.  Myerson's sign positive.  No snout.  VIII:  Reduced hearing to finger rub bilaterally.  Normal Rinne and Weber testing.     IX-X:  Normal palatal movement.   XI:  Normal shoulder shrug and head rotation.   XII:  Normal tongue strength and range of motion, no deviation or fasciculation.  MOTOR:  No atrophy, fasciculations or  abnormal movements.  No pronator drift.  Tone is increased in the legs.    Right Upper Extremity:     Left Upper Extremity:    Deltoid  5/5   Deltoid  5/5   Biceps  5/5   Biceps  5/5   Triceps  5/5   Triceps  5/5   Wrist extensors  5/5   Wrist extensors  5/5   Wrist flexors  5/5   Wrist flexors  5/5   Finger extensors  5/5   Finger extensors  5/5   Finger flexors  5/5   Finger flexors  5/5   Dorsal interossei  5/5   Dorsal interossei  5/5   Abductor pollicis  5/5   Abductor pollicis  5/5   Tone (Ashworth scale)  0  Tone (Ashworth scale)  0   Right Lower Extremity:    Left Lower Extremity:    Hip flexors  5/5   Hip flexors  5/5   Hip extensors  5/5   Hip extensors  5/5   Knee flexors  5/5   Knee flexors  5/5   Knee extensors  5/5   Knee extensors  5/5   Dorsiflexors  5/5   Dorsiflexors  5/5   Plantarflexors  5/5   Plantarflexors  5/5   Toe extensors  5/5   Toe extensors  5/5   Toe flexors  5/5   Toe flexors  5/5   Tone (Ashworth scale)  0+  Tone (Ashworth scale)  0+   MSRs:  Right                                                                 Left brachioradialis 3+  brachioradialis 3+  biceps 3+  biceps 3+  triceps 3+  triceps 3+  patellar 3+  patellar 3+  ankle jerk 0  ankle jerk 0  Hoffman no  Hoffman no  plantar response up  plantar response down  No ankle clonus  SENSORY:  Normal and symmetric perception of light touch, pinprick, vibration, and proprioception.  Romberg's sign absent.   COORDINATION/GAIT: Normal finger-to- nose-finger and heel-to-shin.  Intact rapid alternating movements bilaterally.  Able to rise from a chair without using arms.  Gait narrow based and stable. Tandem and stressed gait intact.    IMPRESSION/PLAN: 1.  Cognitive impairment, moderate based on MoCA (17/30), but she maintains a highly independent and active lifestyle which is rather incongruent with her cognitive testing. To better understand the degree and type of cognitive impairment, formal neuropsychological testing will be ordered.  Her vitamin B12 was borderline low in 2016 and will be  rechecked along with TSH.  With her upper motor neuron findings on exam,  MRI brain and labs for myelopathy including copper, zinc, MMA, and vitamin B12 will be ordered.    2.  Bilateral tinnitus without signs of central pathology, refer to ENT for evaluation  Return to clinic in 3 months.   The duration of this appointment visit was 60 minutes of face-to-face time with the patient.  Greater than 50% of this time was spent in counseling, explanation of diagnosis, planning of further management, and coordination of care.   Thank you for allowing me to participate in patient's care.  If I can answer  any additional questions, I would be pleased to do so.    Sincerely,    Donika K. Posey Pronto, DO

## 2016-01-02 NOTE — Patient Instructions (Addendum)
1.  Check blood work 2.  MRI brain without contrast 3.  Neuropsychological testing 4.  ENT referral  Return to clinic in 3 months

## 2016-01-02 NOTE — Telephone Encounter (Signed)
Pt wants to talk to someone about ENT referral please call her at [phone]

## 2016-01-03 LAB — VITAMIN B12: VITAMIN B 12: 160 pg/mL — AB (ref 211–911)

## 2016-01-03 LAB — TSH: TSH: 2.53 u[IU]/mL (ref 0.35–4.50)

## 2016-01-03 NOTE — Telephone Encounter (Signed)
Received call from [place], please advise. Do you advise pt pay for medication out of pocket?

## 2016-01-03 NOTE — Telephone Encounter (Signed)
Patient set up her own ENT appointment at [facility].

## 2016-01-03 NOTE — Telephone Encounter (Signed)
She can do that or stay with her other medicine which is covered. zeleplon

## 2016-01-06 ENCOUNTER — Other Ambulatory Visit: Payer: Self-pay | Admitting: Geriatric Medicine

## 2016-01-06 ENCOUNTER — Telehealth: Payer: Self-pay | Admitting: Internal Medicine

## 2016-01-06 LAB — METHYLMALONIC ACID, SERUM: METHYLMALONIC ACID, QUANT: 272 nmol/L (ref 87–318)

## 2016-01-06 LAB — ZINC: ZINC: 56 ug/dL — AB (ref 60–130)

## 2016-01-06 MED ORDER — ZALEPLON 10 MG PO CAPS: 10.0000 mg | ORAL_CAPSULE | Freq: Every evening | ORAL | Status: DC | PRN

## 2016-01-06 NOTE — Telephone Encounter (Signed)
Please see previous phone note from 12/19/15. Same advice

## 2016-01-06 NOTE — Telephone Encounter (Signed)
Pt states insurance denied eszopiclone (LUNESTA) 2 MG TABS tablet [id number]  Should you start a PA or prescribe something else She is almost out of last medication  [facility] on [address]

## 2016-01-06 NOTE — Telephone Encounter (Signed)
Spoke with patient. PA was denied for lunesta. Per, Dr. Sharlet Salina refill sonata. Patient aware.

## 2016-01-07 LAB — COPPER, SERUM: Copper: 108 ug/dL (ref 72–166)

## 2016-01-07 NOTE — Telephone Encounter (Signed)
See phone note 01/06/2016

## 2016-01-08 ENCOUNTER — Ambulatory Visit (INDEPENDENT_AMBULATORY_CARE_PROVIDER_SITE_OTHER): Payer: Medicare Other | Admitting: *Deleted

## 2016-01-08 ENCOUNTER — Telehealth: Payer: Self-pay | Admitting: *Deleted

## 2016-01-08 DIAGNOSIS — E60 Dietary zinc deficiency: Secondary | ICD-10-CM

## 2016-01-08 DIAGNOSIS — E538 Deficiency of other specified B group vitamins: Secondary | ICD-10-CM | POA: Diagnosis not present

## 2016-01-08 MED ORDER — CYANOCOBALAMIN 1000 MCG/ML IJ SOLN
1000.0000 ug | Freq: Once | INTRAMUSCULAR | Status: AC
  Administered 2016-01-08: 1000 ug via INTRAMUSCULAR

## 2016-01-08 NOTE — Telephone Encounter (Signed)
Patient given results and instructions.  She will begin injections next week.

## 2016-01-08 NOTE — Progress Notes (Signed)
Patient in for B12 injection. 

## 2016-01-09 ENCOUNTER — Ambulatory Visit (INDEPENDENT_AMBULATORY_CARE_PROVIDER_SITE_OTHER): Payer: Medicare Other | Admitting: *Deleted

## 2016-01-09 DIAGNOSIS — E538 Deficiency of other specified B group vitamins: Secondary | ICD-10-CM | POA: Diagnosis not present

## 2016-01-09 MED ORDER — CYANOCOBALAMIN 1000 MCG/ML IJ SOLN
1000.0000 ug | Freq: Once | INTRAMUSCULAR | Status: AC
  Administered 2016-01-09: 1000 ug via INTRAMUSCULAR

## 2016-01-09 NOTE — Progress Notes (Signed)
Patient in for B12 injection. 

## 2016-01-14 ENCOUNTER — Telehealth: Payer: Self-pay | Admitting: Cardiovascular Disease

## 2016-01-14 ENCOUNTER — Ambulatory Visit (INDEPENDENT_AMBULATORY_CARE_PROVIDER_SITE_OTHER): Payer: Medicare Other | Admitting: *Deleted

## 2016-01-14 DIAGNOSIS — E538 Deficiency of other specified B group vitamins: Secondary | ICD-10-CM | POA: Diagnosis not present

## 2016-01-14 MED ORDER — CYANOCOBALAMIN 1000 MCG/ML IJ SOLN
1000.0000 ug | Freq: Once | INTRAMUSCULAR | Status: AC
  Administered 2016-01-14: 1000 ug via INTRAMUSCULAR

## 2016-01-14 NOTE — Telephone Encounter (Signed)
I spoke with the pt and made her aware of myoview results.  I answered all her questions and placed a copy of test in the mail.

## 2016-01-14 NOTE — Telephone Encounter (Signed)
F/u  Pt returning Rn phone call. Please call back and discuss.   

## 2016-01-14 NOTE — Progress Notes (Signed)
Patient in for B12 injection. 

## 2016-01-14 NOTE — Telephone Encounter (Signed)
Michele Burns is calling because she is needing the results of her stress test. States that she was called , but someone just rattled off the numbers to her . Would like the test to be explained to her as well as she is asking for a copy of the stress test . Please call.  Thanks

## 2016-01-14 NOTE — Telephone Encounter (Signed)
Left message on machine for pt to contact the office.   

## 2016-01-15 ENCOUNTER — Ambulatory Visit (INDEPENDENT_AMBULATORY_CARE_PROVIDER_SITE_OTHER): Payer: Medicare Other | Admitting: Neurology

## 2016-01-15 DIAGNOSIS — E538 Deficiency of other specified B group vitamins: Secondary | ICD-10-CM | POA: Diagnosis not present

## 2016-01-15 MED ORDER — CYANOCOBALAMIN 1000 MCG/ML IJ SOLN
1000.0000 ug | Freq: Once | INTRAMUSCULAR | Status: AC
  Administered 2016-01-15: 1000 ug via INTRAMUSCULAR

## 2016-01-15 NOTE — Progress Notes (Signed)
B12 injection to left deltoid with no apparent complications.   

## 2016-01-16 ENCOUNTER — Inpatient Hospital Stay: Admission: RE | Admit: 2016-01-16 | Payer: Medicare Other | Source: Ambulatory Visit

## 2016-01-16 ENCOUNTER — Ambulatory Visit: Payer: Medicare Other

## 2016-01-17 ENCOUNTER — Ambulatory Visit (INDEPENDENT_AMBULATORY_CARE_PROVIDER_SITE_OTHER): Payer: Medicare Other | Admitting: *Deleted

## 2016-01-17 DIAGNOSIS — E538 Deficiency of other specified B group vitamins: Secondary | ICD-10-CM

## 2016-01-17 MED ORDER — CYANOCOBALAMIN 1000 MCG/ML IJ SOLN
1000.0000 ug | Freq: Once | INTRAMUSCULAR | Status: AC
  Administered 2016-01-17: 1000 ug via INTRAMUSCULAR

## 2016-01-17 NOTE — Progress Notes (Signed)
Patient in for B12 injection. 

## 2016-01-20 ENCOUNTER — Ambulatory Visit: Payer: Medicare Other

## 2016-01-20 ENCOUNTER — Telehealth: Payer: Self-pay | Admitting: *Deleted

## 2016-01-20 DIAGNOSIS — H9113 Presbycusis, bilateral: Secondary | ICD-10-CM | POA: Diagnosis not present

## 2016-01-20 DIAGNOSIS — H9313 Tinnitus, bilateral: Secondary | ICD-10-CM | POA: Diagnosis not present

## 2016-01-20 DIAGNOSIS — H903 Sensorineural hearing loss, bilateral: Secondary | ICD-10-CM | POA: Diagnosis not present

## 2016-01-20 NOTE — Telephone Encounter (Signed)
Noted  

## 2016-01-20 NOTE — Telephone Encounter (Signed)
[name] from Dr. Berle Mull office called to let you know that patient cancelled her appointment with him.

## 2016-01-22 ENCOUNTER — Ambulatory Visit
Admission: RE | Admit: 2016-01-22 | Discharge: 2016-01-22 | Disposition: A | Discharge status: Home / Self Care | Payer: Medicare Other | Source: Ambulatory Visit | Attending: Neurology | Admitting: Neurology

## 2016-01-22 DIAGNOSIS — R7989 Other specified abnormal findings of blood chemistry: Secondary | ICD-10-CM

## 2016-01-22 DIAGNOSIS — R410 Disorientation, unspecified: Secondary | ICD-10-CM | POA: Diagnosis not present

## 2016-01-22 DIAGNOSIS — H9313 Tinnitus, bilateral: Secondary | ICD-10-CM

## 2016-01-22 DIAGNOSIS — E538 Deficiency of other specified B group vitamins: Secondary | ICD-10-CM

## 2016-01-22 DIAGNOSIS — M62838 Other muscle spasm: Secondary | ICD-10-CM

## 2016-01-22 DIAGNOSIS — R292 Abnormal reflex: Secondary | ICD-10-CM

## 2016-01-22 DIAGNOSIS — G3184 Mild cognitive impairment, so stated: Secondary | ICD-10-CM

## 2016-01-30 ENCOUNTER — Ambulatory Visit (INDEPENDENT_AMBULATORY_CARE_PROVIDER_SITE_OTHER): Payer: Medicare Other | Admitting: *Deleted

## 2016-01-30 DIAGNOSIS — E538 Deficiency of other specified B group vitamins: Secondary | ICD-10-CM | POA: Diagnosis not present

## 2016-01-30 MED ORDER — CYANOCOBALAMIN 1000 MCG/ML IJ SOLN
1000.0000 ug | Freq: Once | INTRAMUSCULAR | Status: AC
  Administered 2016-01-30: 1000 ug via INTRAMUSCULAR

## 2016-01-30 NOTE — Progress Notes (Signed)
Patient in for B12 injection. 

## 2016-01-31 ENCOUNTER — Telehealth: Payer: Self-pay | Admitting: Neurology

## 2016-01-31 NOTE — Telephone Encounter (Signed)
I called to make appt with dr Richrd Sox and patient refused appt

## 2016-01-31 NOTE — Telephone Encounter (Signed)
FYI

## 2016-01-31 NOTE — Telephone Encounter (Signed)
Noted  

## 2016-02-05 ENCOUNTER — Telehealth: Payer: Self-pay | Admitting: *Deleted

## 2016-02-05 ENCOUNTER — Other Ambulatory Visit: Payer: Self-pay | Admitting: Geriatric Medicine

## 2016-02-05 ENCOUNTER — Telehealth: Payer: Self-pay | Admitting: Geriatric Medicine

## 2016-02-05 MED ORDER — SIMVASTATIN 40 MG PO TABS: ORAL_TABLET | ORAL | Status: DC

## 2016-02-05 NOTE — Telephone Encounter (Signed)
Patient aware.

## 2016-02-05 NOTE — Telephone Encounter (Signed)
Expedited PA initiated via CoverMyMeds [facility] no longer initiates PA via phone) Key [id number]

## 2016-02-05 NOTE — Telephone Encounter (Signed)
Patient states that her Michele Burns is needing approval from insurance company. I called the pharmacy to verify what they needed. They tried to run it through and it is coming up non-formulary. Are you able to try to get her medication approved if it is on the non formulary list? She has tried other things, and she has been taking Lunesta. Please let me know. The patient is out of medication as of today. She does not have anything to take tonight.

## 2016-02-05 NOTE — Telephone Encounter (Signed)
Pt left msg on triage stating that [facility] will not refill her Zalepon. Requesting call back. [facility] first spoke with Ben/pharmacist he stated pt plan requires her to have a PA done every 90 days. Insurance # [id number], Member ID # [id number]. Called pt to inform her status on what is going on. She states she spoe with her insurance and they stated they have fax the MD some paper work already. Inform will f/u and give her a call back...[name]

## 2016-02-05 NOTE — Telephone Encounter (Signed)
Johnnye Sima was D/C'd for this patient.  PA is required for Sonata, please see other telephone encounter for 02/05/2016 PA initiated

## 2016-02-06 ENCOUNTER — Ambulatory Visit (INDEPENDENT_AMBULATORY_CARE_PROVIDER_SITE_OTHER): Payer: Medicare Other | Admitting: *Deleted

## 2016-02-06 DIAGNOSIS — E538 Deficiency of other specified B group vitamins: Secondary | ICD-10-CM

## 2016-02-06 MED ORDER — CYANOCOBALAMIN 1000 MCG/ML IJ SOLN
1000.0000 ug | Freq: Once | INTRAMUSCULAR | Status: AC
  Administered 2016-02-06: 1000 ug via INTRAMUSCULAR

## 2016-02-06 NOTE — Progress Notes (Signed)
Patient in for B12 injection. 

## 2016-02-10 NOTE — Telephone Encounter (Signed)
PA APPROVED per pharmacy. Pt picked up medication 02/06/2016

## 2016-02-14 ENCOUNTER — Ambulatory Visit (INDEPENDENT_AMBULATORY_CARE_PROVIDER_SITE_OTHER): Payer: Medicare Other | Admitting: Family Medicine

## 2016-02-14 DIAGNOSIS — E538 Deficiency of other specified B group vitamins: Secondary | ICD-10-CM

## 2016-02-14 MED ORDER — CYANOCOBALAMIN 1000 MCG/ML IJ SOLN
1000.0000 ug | Freq: Once | INTRAMUSCULAR | Status: AC
  Administered 2016-02-14: 1000 ug via INTRAMUSCULAR

## 2016-02-18 ENCOUNTER — Ambulatory Visit (INDEPENDENT_AMBULATORY_CARE_PROVIDER_SITE_OTHER): Payer: Medicare Other | Admitting: *Deleted

## 2016-02-18 DIAGNOSIS — E538 Deficiency of other specified B group vitamins: Secondary | ICD-10-CM

## 2016-02-18 MED ORDER — CYANOCOBALAMIN 1000 MCG/ML IJ SOLN
1000.0000 ug | Freq: Once | INTRAMUSCULAR | Status: AC
  Administered 2016-02-18: 1000 ug via INTRAMUSCULAR

## 2016-02-18 NOTE — Progress Notes (Signed)
Patient in for B12 injection. 

## 2016-02-23 ENCOUNTER — Other Ambulatory Visit: Payer: Self-pay | Admitting: Internal Medicine

## 2016-03-24 ENCOUNTER — Ambulatory Visit (INDEPENDENT_AMBULATORY_CARE_PROVIDER_SITE_OTHER): Payer: Medicare Other | Admitting: Cardiovascular Disease

## 2016-03-24 ENCOUNTER — Ambulatory Visit (INDEPENDENT_AMBULATORY_CARE_PROVIDER_SITE_OTHER): Payer: Medicare Other | Admitting: *Deleted

## 2016-03-24 ENCOUNTER — Encounter: Payer: Self-pay | Admitting: *Deleted

## 2016-03-24 ENCOUNTER — Encounter: Payer: Self-pay | Admitting: Cardiovascular Disease

## 2016-03-24 VITALS — BP 118/70 | HR 68 | Ht 66.0 in | Wt 127.1 lb

## 2016-03-24 DIAGNOSIS — E538 Deficiency of other specified B group vitamins: Secondary | ICD-10-CM

## 2016-03-24 DIAGNOSIS — I2510 Atherosclerotic heart disease of native coronary artery without angina pectoris: Secondary | ICD-10-CM | POA: Diagnosis not present

## 2016-03-24 MED ORDER — CYANOCOBALAMIN 1000 MCG/ML IJ SOLN
1000.0000 ug | Freq: Once | INTRAMUSCULAR | Status: AC
  Administered 2016-03-24: 1000 ug via INTRAMUSCULAR

## 2016-03-24 NOTE — Progress Notes (Signed)
Patient in for B12 injection. 

## 2016-03-24 NOTE — Progress Notes (Signed)
Cardiology Office Note Date:  03/24/2016   ID:  EMERSEN KELSAY, DOB [birth year], MRN [id number]  PCP:  Hoyt Koch, MD  Cardiologist:  Sherren Mocha, MD    Chief Complaint  Patient presents with  . Coronary Artery Disease  . Essential Hypertension     History of Present Illness: Michele Burns is a 80 y.o. female who presents for follow-up evaluation. She has CAD and underwent PCI of the right coronary artery in 2014 after presenting with symptoms of unstable angina. She was treated with overlapping drug-eluting stents and adjunctive rotational atherectomy of the RCA. Her left ventricular ejection fraction was 55-60%.  She had done well until this spring when she presented with recurrent exertional chest discomfort. Symptoms slowly resolved on their own. However, she did undergo an exercise stress nuclear scan that showed excellent exercise capacity, no significant ST changes, and normal myocardial perfusion. Symptoms have not returned. She specifically denies chest pain, shortness of breath, edema, or heart palpitations.  Past Medical History  Diagnosis Date  . Hx of colonic polyp 2006    diminutive '06, no polyps on 4/09 colo - for repeat 4/14  . IBS (irritable bowel syndrome)   . Erosive gastritis 2006    NSAID precipitated, resolved on f/u EGD  . MIGRAINE HEADACHE   . PULMONARY NODULE   . PVD     normal ABI/TBI 09/2009, chronic L toe pain and vasospasm (Raynauds)  . POSTMENOPAUSAL SYNDROME   . RENAL CYST   . Hypertension   . Hyperlipidemia   . DEPRESSION   . Coronary artery disease     a. Cath 11/17/12 - severe Ca++ moderately severe disease in the mid RCA, otherwise nonobstructive disease - s/p PTCA/rotational atherectomy & 2 DES to mid RCA 11/22/12.  . Sinus bradycardia     a. not on BB due to this.  . Cecal volvulus ([name]) 06/29/2014  . Hx of adenomatous colonic polyps 02/10/2015    Past Surgical History  Procedure Laterality Date  . Tonsillectomy     As a child  . Vaginal hysterectomy    . Excision morton's neuroma  1971    L 3/4 toe space  . Artherectomy  11/22/2012    RCA  . Abdominal hysterectomy    . Tonsillectomy    . Esophagogastroduodenoscopy    . Colonoscopy    . Ptca    . Laparoscopic right hemi colectomy  06/29/2014  . Laparotomy N/A 06/29/2014    Procedure: EXPLORATORY LAPAROTOMY;  Surgeon: Stark Klein, MD;  Location: [place];  Service: General;  Laterality: N/A;  . Partial colectomy N/A 06/29/2014    Procedure: PARTIAL COLECTOMY;  Surgeon: Stark Klein, MD;  Location: [place];  Service: General;  Laterality: N/A;  . Left heart catheterization with coronary angiogram N/A 11/17/2012    Procedure: LEFT HEART CATHETERIZATION WITH CORONARY ANGIOGRAM;  Surgeon: Peter M Martinique, MD;  Location: [facility] CATH LAB;  Service: Cardiovascular;  Laterality: N/A;  . Percutaneous coronary stent intervention (pci-s) N/A 11/22/2012    Procedure: PERCUTANEOUS CORONARY STENT INTERVENTION (PCI-S);  Surgeon: Peter M Martinique, MD;  Location: [facility] CATH LAB;  Service: Cardiovascular;  Laterality: N/A;  . Percutaneous coronary rotoblator intervention (pci-r)  11/22/2012    Procedure: PERCUTANEOUS CORONARY ROTOBLATOR INTERVENTION (PCI-R);  Surgeon: Peter M Martinique, MD;  Location: [facility] CATH LAB;  Service: Cardiovascular;;    Current Outpatient Prescriptions  Medication Sig Dispense Refill  . aspirin EC 81 MG tablet Take 162 mg by mouth daily.     Marland Kitchen  escitalopram (LEXAPRO) 20 MG tablet Take 1 tablet (20 mg total) by mouth daily. 90 tablet 3  . losartan (COZAAR) 50 MG tablet TAKE 1 TABLET BY MOUTH EVERY MORNING 90 tablet 0  . simvastatin (ZOCOR) 40 MG tablet TAKE 1 BY MOUTH AT BEDTIME 90 tablet 3  . zaleplon (SONATA) 10 MG capsule Take 1 capsule (10 mg total) by mouth at bedtime as needed for sleep. 30 capsule 3   No current facility-administered medications for this visit.    Allergies:   Codeine and Cortisone   Social History:  The patient  reports that she quit  smoking about 35 years ago. Her smoking use included Cigarettes. She quit after 30 years of use. She has never used smokeless tobacco. She reports that she drinks about 6.0 oz of alcohol per week. She reports that she does not use illicit drugs.   Family History:  The patient's family history includes Diabetes in her mother and paternal grandmother; Diabetes type II in her mother; Healthy in her daughter and son; Heart attack (age of onset: 79) in her brother; Heart attack (age of onset: 11) in her father; Heart attack (age of onset: 65) in her mother. There is no history of Colon cancer, Colon polyps, Kidney disease, Esophageal cancer, Gallbladder disease, Hypertension, or Stroke.   ROS:  Please see the history of present illness. All other systems are reviewed and negative.   PHYSICAL EXAM: VS:  BP 118/70 mmHg  Pulse 68  Ht 5\' 6"  (1.676 m)  Wt 127 lb 1.9 oz (57.661 kg)  BMI 20.53 kg/m2 , BMI Body mass index is 20.53 kg/(m^2). GEN: Well nourished, well developed, in no acute distress HEENT: normal Neck: no JVD, no masses. No carotid bruits Cardiac: RRR without murmur or gallop                Respiratory:  clear to auscultation bilaterally, normal work of breathing GI: soft, nontender, nondistended, + BS MS: no deformity or atrophy Ext: no pretibial edema, pedal pulses 2+= bilaterally Skin: warm and dry, no rash Neuro:  Strength and sensation are intact Psych: euthymic mood, full affect  EKG:  EKG is not ordered today.  Recent Labs: 12/19/2015: ALT 17; BUN 24*; Creatinine, Ser 0.95; Hemoglobin 13.1; Platelets 201.0; Potassium 4.7; Sodium 140 01/02/2016: TSH 2.53   Lipid Panel     Component Value Date/Time   CHOL 180 12/19/2015 1157   TRIG 52.0 12/19/2015 1157   TRIG 78 04/17/2009   HDL 98.50 12/19/2015 1157   CHOLHDL 2 12/19/2015 1157   VLDL 10.4 12/19/2015 1157   LDLCALC 71 12/19/2015 1157      Wt Readings from Last 3 Encounters:  03/24/16 127 lb 1.9 oz (57.661 kg)    01/02/16 127 lb 4 oz (57.72 kg)  12/19/15 128 lb (58.06 kg)     Cardiac Studies Reviewed: Nuclear stress test 12-20-2015: Study Highlights     Nuclear stress EF: 59%.  There was no ST segment deviation noted during stress.  The study is normal.  This is a low risk study.  The left ventricular ejection fraction is normal (55-65%).  Normal exercise stress test with no prior infarct and no ischemia. Normal BP response to stress. Excellent exercise capacity.   ASSESSMENT AND PLAN: 1.  CAD, native vessel, without symptoms of angina: Recent stress test reviewed with no evidence of ischemia. LVEF is normal. She will continue on current medical therapy which includes aspirin and a statin drug.  2. Essential hypertension: Blood  pressure well controlled on losartan.  3. Hyperlipidemia: Treated with simvastatin. Recent lipids reviewed as above. She has an excellent lipid panel with very high HDL and LDL at goal.  Current medicines are reviewed with the patient today.  The patient does not have concerns regarding medicines.  Labs/ tests ordered today include:  No orders of the defined types were placed in this encounter.   Disposition:   FU one year  Signed, Sherren Mocha, MD  03/24/2016 1:02 PM    [facility] [address], [place], [place]  [address] Phone: [phone]; Fax: [phone]

## 2016-03-24 NOTE — Patient Instructions (Addendum)
  Medication Instructions:  Your physician recommends that you continue on your current medications as directed. Please refer to the Current Medication list given to you today.   Labwork: None   Testing/Procedures: None    Follow-Up: Your physician wants you to follow-up in: 1 year with Dr Burt Knack (June 2018).  You will receive a reminder letter in the mail two months in advance. If you don't receive a letter, please call our office to schedule the follow-up appointment.      If you need a refill on your cardiac medications before your next appointment, please call your pharmacy.

## 2016-04-03 ENCOUNTER — Telehealth: Payer: Self-pay | Admitting: Cardiovascular Disease

## 2016-04-03 NOTE — Telephone Encounter (Signed)
New Message   Pt call requested a call back from RN. Pt states Dr. Burt Knack Recommended a primary care doctor. Pt is unable to find a Doctor and wants to know if she had the correct name. Only wanted to speak with RN about the correct recommended doctor. Please call back to discuss

## 2016-04-03 NOTE — Telephone Encounter (Signed)
I spoke with the pt and made her aware that Dr Burt Knack recommended [facility].  I provided the pt with the office phone number. The pt was very appreciative of my call.

## 2016-04-20 ENCOUNTER — Ambulatory Visit (INDEPENDENT_AMBULATORY_CARE_PROVIDER_SITE_OTHER): Payer: Medicare Other | Admitting: *Deleted

## 2016-04-20 DIAGNOSIS — E538 Deficiency of other specified B group vitamins: Secondary | ICD-10-CM | POA: Diagnosis not present

## 2016-04-20 MED ORDER — CYANOCOBALAMIN 1000 MCG/ML IJ SOLN
1000.0000 ug | Freq: Once | INTRAMUSCULAR | Status: AC
  Administered 2016-04-20: 1000 ug via INTRAMUSCULAR

## 2016-04-20 NOTE — Progress Notes (Signed)
Patient in for B12 injection. 

## 2016-04-21 DIAGNOSIS — E784 Other hyperlipidemia: Secondary | ICD-10-CM | POA: Diagnosis not present

## 2016-04-21 DIAGNOSIS — Z6821 Body mass index (BMI) 21.0-21.9, adult: Secondary | ICD-10-CM | POA: Diagnosis not present

## 2016-04-21 DIAGNOSIS — I10 Essential (primary) hypertension: Secondary | ICD-10-CM | POA: Diagnosis not present

## 2016-04-21 DIAGNOSIS — F325 Major depressive disorder, single episode, in full remission: Secondary | ICD-10-CM | POA: Diagnosis not present

## 2016-04-21 DIAGNOSIS — R197 Diarrhea, unspecified: Secondary | ICD-10-CM | POA: Diagnosis not present

## 2016-04-21 DIAGNOSIS — I25118 Atherosclerotic heart disease of native coronary artery with other forms of angina pectoris: Secondary | ICD-10-CM | POA: Diagnosis not present

## 2016-04-22 DIAGNOSIS — R197 Diarrhea, unspecified: Secondary | ICD-10-CM | POA: Diagnosis not present

## 2016-05-03 ENCOUNTER — Other Ambulatory Visit: Payer: Self-pay | Admitting: Internal Medicine

## 2016-05-04 NOTE — Telephone Encounter (Signed)
Faxed to pharmacy

## 2016-05-20 ENCOUNTER — Other Ambulatory Visit: Payer: Self-pay | Admitting: Internal Medicine

## 2016-05-26 ENCOUNTER — Ambulatory Visit (INDEPENDENT_AMBULATORY_CARE_PROVIDER_SITE_OTHER): Payer: Medicare Other | Admitting: *Deleted

## 2016-05-26 DIAGNOSIS — E538 Deficiency of other specified B group vitamins: Secondary | ICD-10-CM

## 2016-05-26 MED ORDER — CYANOCOBALAMIN 1000 MCG/ML IJ SOLN
1000.0000 ug | Freq: Once | INTRAMUSCULAR | Status: AC
  Administered 2016-05-26: 1000 ug via INTRAMUSCULAR

## 2016-06-04 DIAGNOSIS — M7062 Trochanteric bursitis, left hip: Secondary | ICD-10-CM | POA: Diagnosis not present

## 2016-06-26 ENCOUNTER — Ambulatory Visit (INDEPENDENT_AMBULATORY_CARE_PROVIDER_SITE_OTHER): Payer: Medicare Other

## 2016-06-26 DIAGNOSIS — E538 Deficiency of other specified B group vitamins: Secondary | ICD-10-CM

## 2016-06-26 MED ORDER — CYANOCOBALAMIN 1000 MCG/ML IJ SOLN
1000.0000 ug | Freq: Once | INTRAMUSCULAR | Status: AC
  Administered 2016-06-26: 1000 ug via INTRAMUSCULAR

## 2016-06-27 ENCOUNTER — Encounter (HOSPITAL_COMMUNITY): Payer: Self-pay | Admitting: Nurse Practitioner

## 2016-06-27 ENCOUNTER — Emergency Department (HOSPITAL_COMMUNITY)
Admission: EM | Admit: 2016-06-27 | Discharge: 2016-06-27 | Disposition: A | Discharge status: Home / Self Care | Payer: Medicare Other | Attending: Emergency Medicine | Admitting: Emergency Medicine

## 2016-06-27 ENCOUNTER — Ambulatory Visit (HOSPITAL_COMMUNITY)
Admission: EM | Admit: 2016-06-27 | Discharge: 2016-06-27 | Discharge status: Absent without leave | Payer: Medicare Other

## 2016-06-27 DIAGNOSIS — Y9389 Activity, other specified: Secondary | ICD-10-CM | POA: Diagnosis not present

## 2016-06-27 DIAGNOSIS — S0502XA Injury of conjunctiva and corneal abrasion without foreign body, left eye, initial encounter: Secondary | ICD-10-CM | POA: Diagnosis not present

## 2016-06-27 DIAGNOSIS — Z7982 Long term (current) use of aspirin: Secondary | ICD-10-CM | POA: Diagnosis not present

## 2016-06-27 DIAGNOSIS — I10 Essential (primary) hypertension: Secondary | ICD-10-CM | POA: Insufficient documentation

## 2016-06-27 DIAGNOSIS — Y929 Unspecified place or not applicable: Secondary | ICD-10-CM | POA: Insufficient documentation

## 2016-06-27 DIAGNOSIS — Y999 Unspecified external cause status: Secondary | ICD-10-CM | POA: Diagnosis not present

## 2016-06-27 DIAGNOSIS — I2510 Atherosclerotic heart disease of native coronary artery without angina pectoris: Secondary | ICD-10-CM | POA: Diagnosis not present

## 2016-06-27 DIAGNOSIS — Z87891 Personal history of nicotine dependence: Secondary | ICD-10-CM | POA: Diagnosis not present

## 2016-06-27 DIAGNOSIS — X58XXXA Exposure to other specified factors, initial encounter: Secondary | ICD-10-CM | POA: Insufficient documentation

## 2016-06-27 DIAGNOSIS — S0592XA Unspecified injury of left eye and orbit, initial encounter: Secondary | ICD-10-CM | POA: Diagnosis present

## 2016-06-27 MED ORDER — TETRACAINE HCL 0.5 % OP SOLN
2.0000 [drp] | Freq: Once | OPHTHALMIC | Status: AC
  Administered 2016-06-27: 2 [drp] via OPHTHALMIC
  Filled 2016-06-27: qty 2

## 2016-06-27 MED ORDER — CIPROFLOXACIN HCL 0.3 % OP SOLN: 1.0000 [drp] | Freq: Four times a day (QID) | OPHTHALMIC | 0 refills | Status: DC

## 2016-06-27 MED ORDER — FLUORESCEIN SODIUM 1 MG OP STRP
1.0000 | ORAL_STRIP | Freq: Once | OPHTHALMIC | Status: AC
  Administered 2016-06-27: 1 via OPHTHALMIC
  Filled 2016-06-27: qty 1

## 2016-06-27 NOTE — ED Provider Notes (Signed)
[facility] DEPT Provider Note   CSN: [id number] Arrival date & time: 06/27/16  1300 By signing my name below, I, Georgette Shell, attest that this documentation has been prepared under the direction and in the presence of Sakia Schrimpf, PA-C. Electronically Signed: Georgette Shell, ED Scribe. 06/27/16. 2:16 PM.  History   Chief Complaint Chief Complaint  Patient presents with  . Eye Injury   HPI Comments: Michele Burns is a 80 y.o. female who presents to the Emergency Department complaining of 8/10 left eye pain onset last night. Pt states the pain began when she rubbed her eye in the middle of the night last night and since then, the pain has became increasingly worse and her eye has been teary. Pt states she is also having minimal decreased vision. No alleviating factors noted. Pt wears glasses, no contacts. No new makeup or facial/skin care products. She denies fever or any other associated symptoms. Pt's Tdap is UTD.  The history is provided by the patient. No language interpreter was used.    Past Medical History:  Diagnosis Date  . Cecal volvulus ([place]) 06/29/2014  . Coronary artery disease    a. Cath 11/17/12 - severe Ca++ moderately severe disease in the mid RCA, otherwise nonobstructive disease - s/p PTCA/rotational atherectomy & 2 DES to mid RCA 11/22/12.  Marland Kitchen DEPRESSION   . Erosive gastritis 2006   NSAID precipitated, resolved on f/u EGD  . Hx of adenomatous colonic polyps 02/10/2015  . Hx of colonic polyp 2006   diminutive '06, no polyps on 4/09 colo - for repeat 4/14  . Hyperlipidemia   . Hypertension   . IBS (irritable bowel syndrome)   . MIGRAINE HEADACHE   . POSTMENOPAUSAL SYNDROME   . PULMONARY NODULE   . PVD    normal ABI/TBI 09/2009, chronic L toe pain and vasospasm (Raynauds)  . RENAL CYST   . Sinus bradycardia    a. not on BB due to this.    Patient Active Problem List   Diagnosis Date Noted  . Chronic insomnia 01/21/2015  . Low serum vitamin B12 12/06/2014  .  Anemia 12/03/2014  . Tremor   . Atherosclerosis of native arteries of the extremities with ulceration(440.23) 01/02/2014  . CAD (coronary artery disease) 11/18/2012  . Depression 12/25/2010  . IRRITABLE BOWEL SYNDROME 10/08/2010  . PULMONARY NODULE 08/14/2010  . PVD 12/27/2009  . MIGRAINE HEADACHE 11/12/2009  . RENAL CYST 11/12/2009  . CARDIAC MURMUR 09/23/2009  . Hyperlipidemia 09/20/2009  . Essential hypertension 09/20/2009    Past Surgical History:  Procedure Laterality Date  . ABDOMINAL HYSTERECTOMY    . artherectomy  11/22/2012   RCA  . COLONOSCOPY    . ESOPHAGOGASTRODUODENOSCOPY    . [place]   L 3/4 toe space  . LAPAROSCOPIC RIGHT HEMI COLECTOMY  06/29/2014  . LAPAROTOMY N/A 06/29/2014   Procedure: EXPLORATORY LAPAROTOMY;  Surgeon: Stark Klein, MD;  Location: [place];  Service: General;  Laterality: N/A;  . LEFT HEART CATHETERIZATION WITH CORONARY ANGIOGRAM N/A 11/17/2012   Procedure: LEFT HEART CATHETERIZATION WITH CORONARY ANGIOGRAM;  Surgeon: Peter M Martinique, MD;  Location: [facility] CATH LAB;  Service: Cardiovascular;  Laterality: N/A;  . PARTIAL COLECTOMY N/A 06/29/2014   Procedure: PARTIAL COLECTOMY;  Surgeon: Stark Klein, MD;  Location: [facility];  Service: General;  Laterality: N/A;  . PERCUTANEOUS CORONARY ROTOBLATOR INTERVENTION (PCI-R)  11/22/2012   Procedure: PERCUTANEOUS CORONARY ROTOBLATOR INTERVENTION (PCI-R);  Surgeon: Peter M Martinique, MD;  Location: [facility] CATH  LAB;  Service: Cardiovascular;;  . PERCUTANEOUS CORONARY STENT INTERVENTION (PCI-S) N/A 11/22/2012   Procedure: PERCUTANEOUS CORONARY STENT INTERVENTION (PCI-S);  Surgeon: Peter M Martinique, MD;  Location: [facility] CATH LAB;  Service: Cardiovascular;  Laterality: N/A;  . PTCA    . TONSILLECTOMY     As a child  . TONSILLECTOMY    . VAGINAL HYSTERECTOMY      OB History    No data available       Home Medications    Prior to Admission medications   Medication Sig Start Date End Date Taking?  Authorizing Provider  aspirin EC 81 MG tablet Take 162 mg by mouth daily.  11/18/12   Jessica A Hope, PA-C  ciprofloxacin (CILOXAN) 0.3 % ophthalmic solution Place 1-2 drops into the left eye 4 (four) times daily. X 5 days 06/27/16   Clayton Bibles, PA-C  escitalopram (LEXAPRO) 20 MG tablet Take 1 tablet (20 mg total) by mouth daily. 12/19/15   Hoyt Koch, MD  losartan (COZAAR) 50 MG tablet TAKE 1 TABLET BY MOUTH EVERY MORNING 05/20/16   Hoyt Koch, MD  simvastatin (ZOCOR) 40 MG tablet TAKE 1 BY MOUTH AT BEDTIME 02/05/16   Hoyt Koch, MD  zaleplon (SONATA) 10 MG capsule TAKE ONE CAPSULE BY MOUTH AT BEDTIME AS NEEDED FOR SLEEP 05/04/16   Hoyt Koch, MD    Family History Family History  Problem Relation Age of Onset  . Heart attack Father 54    deceased  . Heart attack Mother 3    deceased  . Diabetes type II Mother   . Diabetes Mother   . Heart attack Brother 49    deceased  . Diabetes Paternal Grandmother     IDDM  . Colon cancer Neg Hx   . Colon polyps Neg Hx   . Kidney disease Neg Hx   . Esophageal cancer Neg Hx   . Gallbladder disease Neg Hx   . Hypertension Neg Hx   . Stroke Neg Hx   . Healthy Son     x 2  . Healthy Daughter     Social History Social History  Substance Use Topics  . Smoking status: Former Smoker    Years: 30.00    Types: Cigarettes    Quit date: 10/12/1980  . Smokeless tobacco: Never Used     Comment: She is widowed since 2002-she has 3 grown children and 5 g-kids. Moved to [place] from [place] in 2010 to be close to family  . Alcohol use 6.0 oz/week    10 Glasses of wine per week     Comment: <2 /day, white wine twice daily     Allergies   Codeine and Cortisone   Review of Systems Review of Systems  Constitutional: Negative for fever.  Eyes: Positive for pain, discharge and visual disturbance.  Allergic/Immunologic: Negative for immunocompromised state.  Hematological: Does not bruise/bleed easily.    Psychiatric/Behavioral: Negative for self-injury (accidental).     Physical Exam Updated Vital Signs BP 115/79 (BP Location: Left Arm)   Pulse (!) 57   Temp 97.8 F (36.6 C) (Oral)   Resp 14   SpO2 97%   Physical Exam  Constitutional: She appears well-developed and well-nourished.  HENT:  Head: Normocephalic and atraumatic.  Eyes: Conjunctivae, EOM and lids are normal. Pupils are equal, round, and reactive to light. Right eye exhibits no discharge. Left eye exhibits no discharge. Right conjunctiva is not injected. Right conjunctiva has no hemorrhage. Left conjunctiva is  not injected. Left conjunctiva has no hemorrhage. No scleral icterus.  Slit lamp exam:      The left eye shows corneal abrasion.  Small corneal abrasion at approximately 4 o'clock over the iris.  Neck: Neck supple.  Pulmonary/Chest: Effort normal.  Neurological: She is alert.  Nursing note and vitals reviewed.    ED Treatments / Results  DIAGNOSTIC STUDIES: Oxygen Saturation is 97% on RA, adequate by my interpretation.    COORDINATION OF CARE: 2:16 PM Discussed treatment plan with pt at bedside which includes eyedrops and pt agreed to plan.  Labs (all labs ordered are listed, but only abnormal results are displayed) Labs Reviewed - No data to display  EKG  EKG Interpretation None       Radiology No results found.  Procedures Procedures (including critical care time)  Medications Ordered in ED Medications  tetracaine (PONTOCAINE) 0.5 % ophthalmic solution 2 drop (2 drops Right Eye Given 06/27/16 1344)  fluorescein ophthalmic strip 1 strip (1 strip Left Eye Given 06/27/16 1344)     Initial Impression / Assessment and Plan / ED Course  I have reviewed the triage vital signs and the nursing notes.  Pertinent labs & imaging results that were available during my care of the patient were reviewed by me and considered in my medical decision making (see chart for details).  Clinical Course     Afebrile, nontoxic patient with left eye pain and clear discharge after scratching her own eye accidentally in the middle of the night.  Corneal abrasion present.  No FB seen.  Pt also seen by Dr Regenia Skeeter.   D/C home with ciprofloxacin ophth drops, ophthalmology follow up (pt has an ophthalmologist she would like to follow up with).  Discussed result, findings, treatment, and follow up  with patient.  Pt given return precautions.  Pt verbalizes understanding and agrees with plan.       Final Clinical Impressions(s) / ED Diagnoses   Final diagnoses:  Corneal abrasion, left, initial encounter    New Prescriptions Discharge Medication List as of 06/27/2016  2:39 PM    START taking these medications   Details  ciprofloxacin (CILOXAN) 0.3 % ophthalmic solution Place 1-2 drops into the left eye 4 (four) times daily. X 5 days, Starting Sat 06/27/2016, Print        I personally performed the services described in this documentation, which was scribed in my presence. The recorded information has been reviewed and is accurate.     Clayton Bibles, PA-C 06/27/16 1635    Sherwood Gambler, MD 06/29/16 [phone]

## 2016-06-27 NOTE — ED Notes (Signed)
Woke this am with left eye "irritation". Slight redness, no drainage noted.

## 2016-06-27 NOTE — Discharge Instructions (Signed)
Read the information below.  Use the prescribed medication as directed.  Please discuss all new medications with your pharmacist.  You may return to the Emergency Department at any time for worsening condition or any new symptoms that concern you.      If you develop worsening pain in your eye, change in your vision, swelling around your eye, difficulty moving your eye, or fevers greater than 100.4, see your eye doctor or return to the Emergency Department immediately for a recheck.    °

## 2016-06-27 NOTE — ED Triage Notes (Signed)
Pt presents with c/o L eye pain. Onset of pain after she rubbed her eye in the middle of night last night. Since then,pain has been increasingly worse and she is having trouble seeing out the eye now. She is alert and breathing easily.

## 2016-07-10 DIAGNOSIS — Z1231 Encounter for screening mammogram for malignant neoplasm of breast: Secondary | ICD-10-CM | POA: Diagnosis not present

## 2016-07-24 ENCOUNTER — Ambulatory Visit (INDEPENDENT_AMBULATORY_CARE_PROVIDER_SITE_OTHER): Payer: Medicare Other

## 2016-07-24 DIAGNOSIS — E538 Deficiency of other specified B group vitamins: Secondary | ICD-10-CM

## 2016-07-24 MED ORDER — CYANOCOBALAMIN 1000 MCG/ML IJ SOLN
1000.0000 ug | Freq: Once | INTRAMUSCULAR | Status: AC
  Administered 2016-07-24: 1000 ug via INTRAMUSCULAR

## 2016-08-02 ENCOUNTER — Other Ambulatory Visit: Payer: Self-pay | Admitting: Cardiovascular Disease

## 2016-08-02 ENCOUNTER — Other Ambulatory Visit: Payer: Self-pay | Admitting: Internal Medicine

## 2016-08-03 NOTE — Telephone Encounter (Signed)
Rx sent to the pharmacy.

## 2016-08-03 NOTE — Telephone Encounter (Signed)
Please advise on refill request as patients pcp has been refilling this and I do not see where Dr Burt Knack has ever filled this for the patient. Thanks, MI

## 2016-08-04 DIAGNOSIS — H43813 Vitreous degeneration, bilateral: Secondary | ICD-10-CM | POA: Diagnosis not present

## 2016-08-04 DIAGNOSIS — H1789 Other corneal scars and opacities: Secondary | ICD-10-CM | POA: Diagnosis not present

## 2016-08-04 DIAGNOSIS — H2513 Age-related nuclear cataract, bilateral: Secondary | ICD-10-CM | POA: Diagnosis not present

## 2016-08-04 DIAGNOSIS — H25013 Cortical age-related cataract, bilateral: Secondary | ICD-10-CM | POA: Diagnosis not present

## 2016-08-04 NOTE — Telephone Encounter (Signed)
Faxed to pharmacy

## 2016-08-13 DIAGNOSIS — Z23 Encounter for immunization: Secondary | ICD-10-CM | POA: Diagnosis not present

## 2016-08-25 ENCOUNTER — Ambulatory Visit (INDEPENDENT_AMBULATORY_CARE_PROVIDER_SITE_OTHER): Payer: Medicare Other

## 2016-08-25 DIAGNOSIS — E538 Deficiency of other specified B group vitamins: Secondary | ICD-10-CM | POA: Diagnosis not present

## 2016-08-25 MED ORDER — CYANOCOBALAMIN 1000 MCG/ML IJ SOLN
1000.0000 ug | Freq: Once | INTRAMUSCULAR | Status: AC
  Administered 2016-08-25: 1000 ug via INTRAMUSCULAR

## 2016-09-22 ENCOUNTER — Ambulatory Visit (INDEPENDENT_AMBULATORY_CARE_PROVIDER_SITE_OTHER): Payer: Medicare Other

## 2016-09-22 DIAGNOSIS — E538 Deficiency of other specified B group vitamins: Secondary | ICD-10-CM | POA: Diagnosis not present

## 2016-09-22 MED ORDER — CYANOCOBALAMIN 1000 MCG/ML IJ SOLN
1000.0000 ug | Freq: Once | INTRAMUSCULAR | Status: AC
  Administered 2016-09-22: 1000 ug via INTRAMUSCULAR

## 2016-10-19 DIAGNOSIS — M7062 Trochanteric bursitis, left hip: Secondary | ICD-10-CM | POA: Diagnosis not present

## 2016-10-22 ENCOUNTER — Ambulatory Visit (INDEPENDENT_AMBULATORY_CARE_PROVIDER_SITE_OTHER): Payer: Medicare Other | Admitting: *Deleted

## 2016-10-22 DIAGNOSIS — E538 Deficiency of other specified B group vitamins: Secondary | ICD-10-CM

## 2016-10-22 MED ORDER — CYANOCOBALAMIN 1000 MCG/ML IJ SOLN
1000.0000 ug | Freq: Once | INTRAMUSCULAR | Status: AC
  Administered 2016-10-22: 1000 ug via INTRAMUSCULAR

## 2016-10-26 DIAGNOSIS — M7062 Trochanteric bursitis, left hip: Secondary | ICD-10-CM | POA: Diagnosis not present

## 2016-11-05 DIAGNOSIS — H25811 Combined forms of age-related cataract, right eye: Secondary | ICD-10-CM | POA: Diagnosis not present

## 2016-11-05 DIAGNOSIS — H25011 Cortical age-related cataract, right eye: Secondary | ICD-10-CM | POA: Diagnosis not present

## 2016-11-05 DIAGNOSIS — H2511 Age-related nuclear cataract, right eye: Secondary | ICD-10-CM | POA: Diagnosis not present

## 2016-11-05 DIAGNOSIS — H25812 Combined forms of age-related cataract, left eye: Secondary | ICD-10-CM | POA: Diagnosis not present

## 2016-11-06 DIAGNOSIS — M7062 Trochanteric bursitis, left hip: Secondary | ICD-10-CM | POA: Diagnosis not present

## 2016-11-06 DIAGNOSIS — S76012D Strain of muscle, fascia and tendon of left hip, subsequent encounter: Secondary | ICD-10-CM | POA: Diagnosis not present

## 2016-11-06 DIAGNOSIS — M7072 Other bursitis of hip, left hip: Secondary | ICD-10-CM | POA: Diagnosis not present

## 2016-11-20 ENCOUNTER — Ambulatory Visit (INDEPENDENT_AMBULATORY_CARE_PROVIDER_SITE_OTHER): Payer: Medicare Other

## 2016-11-20 DIAGNOSIS — E538 Deficiency of other specified B group vitamins: Secondary | ICD-10-CM | POA: Diagnosis not present

## 2016-11-20 MED ORDER — CYANOCOBALAMIN 1000 MCG/ML IJ SOLN
1000.0000 ug | Freq: Once | INTRAMUSCULAR | Status: AC
  Administered 2016-11-20: 1000 ug via INTRAMUSCULAR

## 2016-12-11 DIAGNOSIS — H353132 Nonexudative age-related macular degeneration, bilateral, intermediate dry stage: Secondary | ICD-10-CM | POA: Diagnosis not present

## 2016-12-15 DIAGNOSIS — I10 Essential (primary) hypertension: Secondary | ICD-10-CM | POA: Diagnosis not present

## 2016-12-15 DIAGNOSIS — E784 Other hyperlipidemia: Secondary | ICD-10-CM | POA: Diagnosis not present

## 2016-12-22 DIAGNOSIS — R32 Unspecified urinary incontinence: Secondary | ICD-10-CM | POA: Diagnosis not present

## 2016-12-22 DIAGNOSIS — Z0000 Encounter for general adult medical examination without abnormal findings: Secondary | ICD-10-CM | POA: Diagnosis not present

## 2016-12-22 DIAGNOSIS — R740 Nonspecific elevation of levels of transaminase and lactic acid dehydrogenase [LDH]: Secondary | ICD-10-CM | POA: Diagnosis not present

## 2016-12-22 DIAGNOSIS — G4700 Insomnia, unspecified: Secondary | ICD-10-CM | POA: Diagnosis not present

## 2016-12-24 ENCOUNTER — Ambulatory Visit (INDEPENDENT_AMBULATORY_CARE_PROVIDER_SITE_OTHER): Payer: Medicare Other | Admitting: *Deleted

## 2016-12-24 DIAGNOSIS — E538 Deficiency of other specified B group vitamins: Secondary | ICD-10-CM

## 2016-12-24 MED ORDER — CYANOCOBALAMIN 1000 MCG/ML IJ SOLN: 1000.0000 ug | Freq: Once | INTRAMUSCULAR | 0 refills | Status: DC

## 2016-12-24 MED ORDER — CYANOCOBALAMIN 1000 MCG/ML IJ SOLN
1000.0000 ug | Freq: Once | INTRAMUSCULAR | Status: AC
  Administered 2016-12-24: 1000 ug via INTRAMUSCULAR

## 2017-01-19 ENCOUNTER — Ambulatory Visit (INDEPENDENT_AMBULATORY_CARE_PROVIDER_SITE_OTHER): Payer: Medicare Other | Admitting: *Deleted

## 2017-01-19 DIAGNOSIS — E538 Deficiency of other specified B group vitamins: Secondary | ICD-10-CM | POA: Diagnosis not present

## 2017-01-19 MED ORDER — CYANOCOBALAMIN 1000 MCG/ML IJ SOLN
1000.0000 ug | Freq: Once | INTRAMUSCULAR | Status: AC
  Administered 2017-01-19: 1000 ug via INTRAMUSCULAR

## 2017-01-27 ENCOUNTER — Other Ambulatory Visit: Payer: Self-pay | Admitting: Internal Medicine

## 2017-01-28 DIAGNOSIS — M6588 Other synovitis and tenosynovitis, other site: Secondary | ICD-10-CM | POA: Diagnosis not present

## 2017-01-28 DIAGNOSIS — M79672 Pain in left foot: Secondary | ICD-10-CM | POA: Diagnosis not present

## 2017-01-28 DIAGNOSIS — M21612 Bunion of left foot: Secondary | ICD-10-CM | POA: Diagnosis not present

## 2017-02-04 DIAGNOSIS — H25812 Combined forms of age-related cataract, left eye: Secondary | ICD-10-CM | POA: Diagnosis not present

## 2017-02-04 DIAGNOSIS — H25012 Cortical age-related cataract, left eye: Secondary | ICD-10-CM | POA: Diagnosis not present

## 2017-02-04 DIAGNOSIS — H2512 Age-related nuclear cataract, left eye: Secondary | ICD-10-CM | POA: Diagnosis not present

## 2017-02-08 DIAGNOSIS — D1801 Hemangioma of skin and subcutaneous tissue: Secondary | ICD-10-CM | POA: Diagnosis not present

## 2017-02-08 DIAGNOSIS — L821 Other seborrheic keratosis: Secondary | ICD-10-CM | POA: Diagnosis not present

## 2017-02-08 DIAGNOSIS — L720 Epidermal cyst: Secondary | ICD-10-CM | POA: Diagnosis not present

## 2017-02-08 DIAGNOSIS — Z85828 Personal history of other malignant neoplasm of skin: Secondary | ICD-10-CM | POA: Diagnosis not present

## 2017-02-09 DIAGNOSIS — N3946 Mixed incontinence: Secondary | ICD-10-CM | POA: Diagnosis not present

## 2017-02-13 ENCOUNTER — Other Ambulatory Visit: Payer: Self-pay | Admitting: Internal Medicine

## 2017-02-19 ENCOUNTER — Ambulatory Visit (INDEPENDENT_AMBULATORY_CARE_PROVIDER_SITE_OTHER): Payer: Medicare Other | Admitting: *Deleted

## 2017-02-19 DIAGNOSIS — E538 Deficiency of other specified B group vitamins: Secondary | ICD-10-CM | POA: Diagnosis not present

## 2017-02-19 MED ORDER — CYANOCOBALAMIN 1000 MCG/ML IJ SOLN
1000.0000 ug | Freq: Once | INTRAMUSCULAR | Status: AC
  Administered 2017-02-19: 1000 ug via INTRAMUSCULAR

## 2017-02-22 DIAGNOSIS — M79672 Pain in left foot: Secondary | ICD-10-CM | POA: Diagnosis not present

## 2017-02-22 DIAGNOSIS — M6588 Other synovitis and tenosynovitis, other site: Secondary | ICD-10-CM | POA: Diagnosis not present

## 2017-02-22 DIAGNOSIS — M21612 Bunion of left foot: Secondary | ICD-10-CM | POA: Diagnosis not present

## 2017-02-22 NOTE — Progress Notes (Deleted)
Cardiology Office Note    Date:  02/22/2017   ID:  Michele Burns, DOB November 10, 1935, MRN [id number]  PCP:  Hoyt Koch, MD  Cardiologist:  Dr. Burt Knack  Chief Complaint:????  History of Present Illness:   Michele Burns is a 81 y.o. female with hx of CAd, HTN, HLD, IBS, sinus bradycardia (not on AV blocking agent) presents for  She has CAD and underwent PCI of the right coronary artery in 2014 after presenting with symptoms of unstable angina. She was treated with overlapping drug-eluting stents and adjunctive rotational atherectomy of the RCA. Her left ventricular ejection fraction was 55-60%. Last myoview 12/2015 is low risk with excellent exercise capacity.   Here today for   Past Medical History:  Diagnosis Date  . 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 Coronary artery disease    a. Cath 11/17/12 - severe Ca++ moderately severe disease in the mid RCA, otherwise nonobstructive disease - s/p PTCA/rotational atherectomy & 2 DES to mid RCA 11/22/12.  Marland Kitchen DEPRESSION   . Erosive gastritis 2006   NSAID precipitated, resolved on f/u EGD  . Hx of adenomatous colonic polyps 02/10/2015  . Hx of colonic polyp 2006   diminutive '06, no polyps on 4/09 colo - for repeat 4/14  . Hyperlipidemia   . Hypertension   . IBS (irritable bowel syndrome)   . MIGRAINE HEADACHE   . POSTMENOPAUSAL SYNDROME   . PULMONARY NODULE   . PVD    normal ABI/TBI 09/2009, chronic L toe pain and vasospasm (Raynauds)  . RENAL CYST   . Sinus bradycardia    a. not on BB due to this.    Past Surgical History:  Procedure Laterality Date  . ABDOMINAL HYSTERECTOMY    . artherectomy  11/22/2012   RCA  . COLONOSCOPY    . ESOPHAGOGASTRODUODENOSCOPY    . [place]   L 3/4 toe space  . LAPAROSCOPIC RIGHT HEMI COLECTOMY  06/29/2014  . LAPAROTOMY N/A 06/29/2014   Procedure: EXPLORATORY LAPAROTOMY;  Surgeon: Stark Klein, MD;  Location: [place];  Service: General;  Laterality: N/A;  . LEFT  HEART CATHETERIZATION WITH CORONARY ANGIOGRAM N/A 11/17/2012   Procedure: LEFT HEART CATHETERIZATION WITH CORONARY ANGIOGRAM;  Surgeon: Peter M Martinique, MD;  Location[facility] CATH LAB;  Service: Cardiovascular;  Laterality: N/A;  . PARTIAL COLECTOMY N/A 06/29/2014   Procedure: PARTIAL COLECTOMY;  Surgeon: Stark Klein, MD;  Location: [place];  Service: General;  Laterality: N/A;  . PERCUTANEOUS CORONARY ROTOBLATOR INTERVENTION (PCI-R)  11/22/2012   Procedure: PERCUTANEOUS CORONARY ROTOBLATOR INTERVENTION (PCI-R);  Surgeon: Peter M Martinique, MD;  Location: [facility] CATH LAB;  Service: Cardiovascular;;  . PERCUTANEOUS CORONARY STENT INTERVENTION (PCI-S) N/A 11/22/2012   Procedure: PERCUTANEOUS CORONARY STENT INTERVENTION (PCI-S);  Surgeon: Peter M Martinique, MD;  Location: [facility] CATH LAB;  Service: Cardiovascular;  Laterality: N/A;  . PTCA    . TONSILLECTOMY     As a child  . TONSILLECTOMY    . VAGINAL HYSTERECTOMY      Current Medications: Prior to Admission medications   Medication Sig Start Date End Date Taking? Authorizing Provider  aspirin EC 81 MG tablet Take 162 mg by mouth daily.  11/18/12   Hope, Jessica A, PA-C  ciprofloxacin (CILOXAN) 0.3 % ophthalmic solution Place 1-2 drops into the left eye 4 (four) times daily. X 5 days 06/27/16   Clayton Bibles, PA-C  escitalopram (LEXAPRO) 20 MG tablet Take 1 tablet (20 mg total) by mouth daily. 12/19/15  Hoyt Koch, MD  losartan (COZAAR) 50 MG tablet TAKE 1 TABLET BY MOUTH EVERY MORNING 08/03/16   Sherren Mocha, MD  simvastatin (ZOCOR) 40 MG tablet TAKE 1 BY MOUTH AT BEDTIME 02/05/16   Hoyt Koch, MD  zaleplon (SONATA) 10 MG capsule TAKE ONE CAPSULE BY MOUTH AT BEDTIME AS NEEDED FOR SLEEP 08/04/16   [place] Circle, FNP    Allergies:   Codeine and Cortisone   Social History   Social History  . Marital status: Widowed    Spouse name: N/A  . Number of children: 3  . Years of education: N/A   Occupational History  . [employer]   Social History Main Topics  . Smoking status: Former Smoker    Years: 30.00    Types: Cigarettes    Quit date: 10/12/1980  . Smokeless tobacco: Never Used     Comment: She is widowed since 2002-she has 3 grown children and 5 g-kids. Moved to [place] from [place] in 2010 to be close to family  . Alcohol use 6.0 oz/week    10 Glasses of wine per week     Comment: <2 /day, white wine twice daily  . Drug use: No  . Sexual activity: Not on file   Other Topics Concern  . Not on file   Social History Narrative   Lives alone in a one story home. Husband passed away 15 year ago. Has 3 children.     Works part time in [employer] (medical).     Education: 2 years of college.      Family History:  The patient's family history includes Diabetes in her mother and paternal grandmother; Diabetes type II in her mother; Healthy in her daughter and son; Heart attack (age of onset: 33) in her brother; Heart attack (age of onset: 47) in her father; Heart attack (age of onset: 55) in her mother. ***  ROS:   Please see the history of present illness.    ROS All other systems reviewed and are negative.   PHYSICAL EXAM:   VS:  There were no vitals taken for this visit.   GEN: Well nourished, well developed, in no acute distress  HEENT: normal  Neck: no JVD, carotid bruits, or masses Cardiac: ***RRR; no murmurs, rubs, or gallops,no edema  Respiratory:  clear to auscultation bilaterally, normal work of breathing GI: soft, nontender, nondistended, + BS MS: no deformity or atrophy  Skin: warm and dry, no rash Neuro:  Alert and Oriented x 3, Strength and sensation are intact Psych: euthymic mood, full affect  Wt Readings from Last 3 Encounters:  03/24/16 127 lb 1.9 oz (57.7 kg)  01/02/16 127 lb 4 oz (57.7 kg)  12/19/15 128 lb (58.1 kg)      Studies/Labs Reviewed:   EKG:  EKG is ordered today.  The ekg ordered today demonstrates ***  Recent Labs: No results found for requested labs within last 8760  hours.   Lipid Panel    Component Value Date/Time   CHOL 180 12/19/2015 1157   TRIG 52.0 12/19/2015 1157   TRIG 78 04/17/2009   HDL 98.50 12/19/2015 1157   CHOLHDL 2 12/19/2015 1157   VLDL 10.4 12/19/2015 1157   LDLCALC 71 12/19/2015 1157    Additional studies/ records that were reviewed today include:   Echocardiogram: 02/2015 Study Conclusions  - Stress ECG conclusions: There were no stress arrhythmias or   conduction abnormalities. The stress ECG was normal. - Staged echo: There  was no echocardiographic evidence for   stress-induced ischemia.  Impressions:  - Normal stress echocardiogram. Excellent exercise capacity. Normal   BP response.  Nuclear stress test 12-20-2015:    Study Highlights     Nuclear stress EF: 59%.  There was no ST segment deviation noted during stress.  The study is normal.  This is a low risk study.  The left ventricular ejection fraction is normal (55-65%).  Normal exercise stress test with no prior infarct and no ischemia. Normal BP response to stress      ASSESSMENT & PLAN:    1. CAD  2. HTN  3. HLD    Medication Adjustments/Labs and Tests Ordered: Current medicines are reviewed at length with the patient today.  Concerns regarding medicines are outlined above.  Medication changes, Labs and Tests ordered today are listed in the Patient Instructions below. There are no Patient Instructions on file for this visit.   Jarrett Soho, [place]  02/22/2017 10:18 AM    [address], [place], [place]  [address] Phone: [phone]; Fax: [phone]

## 2017-02-23 ENCOUNTER — Encounter: Payer: Self-pay | Admitting: Physician Assistant

## 2017-02-24 ENCOUNTER — Ambulatory Visit: Payer: Medicare Other | Admitting: Physician Assistant

## 2017-02-24 ENCOUNTER — Telehealth: Payer: Self-pay | Admitting: Cardiovascular Disease

## 2017-02-24 NOTE — Telephone Encounter (Signed)
New message     For Michele Burns Pt was late for her 02-24-17 appt with Vin and was not seen.  She want to see Dr Burt Knack only.  Please call and let her know when she can see Dr Burt Knack.

## 2017-02-25 NOTE — Telephone Encounter (Signed)
Left message on machine for pt to contact the office.  The pt already has a pending appointment with Dr Burt Knack in June.

## 2017-02-25 NOTE — Telephone Encounter (Signed)
I spoke with the pt and she complains of pulsing and tingling in her feet and legs. The pt saw a podiatrist and was told she has neuropathy. They instructed her to take Vitamin C and Vitamin D.  The pt does stand on her feet at work for long periods of time and states she has done this since the age of 28. The pt wondered if she needs to see Cardiology.  I advised the pt that she should see her PCP. The pt is scheduled for her 1 year appointment 03/29/2017 with Dr Burt Knack.

## 2017-03-03 ENCOUNTER — Encounter: Payer: Self-pay | Admitting: Physician Assistant

## 2017-03-10 ENCOUNTER — Encounter: Payer: Self-pay | Admitting: Cardiovascular Disease

## 2017-03-16 DIAGNOSIS — Z961 Presence of intraocular lens: Secondary | ICD-10-CM | POA: Diagnosis not present

## 2017-03-23 ENCOUNTER — Ambulatory Visit (INDEPENDENT_AMBULATORY_CARE_PROVIDER_SITE_OTHER): Payer: Medicare Other | Admitting: *Deleted

## 2017-03-23 DIAGNOSIS — E538 Deficiency of other specified B group vitamins: Secondary | ICD-10-CM | POA: Diagnosis not present

## 2017-03-23 MED ORDER — CYANOCOBALAMIN 1000 MCG/ML IJ SOLN
1000.0000 ug | Freq: Once | INTRAMUSCULAR | Status: AC
  Administered 2017-03-23: 1000 ug via INTRAMUSCULAR

## 2017-03-29 ENCOUNTER — Encounter: Payer: Self-pay | Admitting: Cardiovascular Disease

## 2017-03-29 ENCOUNTER — Ambulatory Visit (INDEPENDENT_AMBULATORY_CARE_PROVIDER_SITE_OTHER): Payer: Medicare Other | Admitting: Cardiovascular Disease

## 2017-03-29 VITALS — BP 120/64 | HR 56 | Ht 67.0 in | Wt 131.4 lb

## 2017-03-29 DIAGNOSIS — I2510 Atherosclerotic heart disease of native coronary artery without angina pectoris: Secondary | ICD-10-CM

## 2017-03-29 MED ORDER — ASPIRIN EC 81 MG PO TBEC: 81.0000 mg | DELAYED_RELEASE_TABLET | Freq: Every day | ORAL | Status: DC

## 2017-03-29 NOTE — Patient Instructions (Signed)
Medication Instructions:  Your physician has recommended you make the following change in your medication:  1. DECREASE Aspirin to 81mg  take one tablet by mouth daily  Labwork: No new orders.  Testing/Procedures: No new orders.   Follow-Up: Your physician wants you to follow-up in: 1 YEAR with Dr Burt Knack. You will receive a reminder letter in the mail two months in advance. If you don't receive a letter, please call our office to schedule the follow-up appointment.   Any Other Special Instructions Will Be Listed Below (If Applicable).     If you need a refill on your cardiac medications before your next appointment, please call your pharmacy.

## 2017-03-29 NOTE — Progress Notes (Signed)
Cardiology Office Note Date:  03/29/2017   ID:  Michele Burns, DOB 1936/01/15, MRN [id number]  PCP:  Michele Hatchet, MD  Cardiologist:  Michele Mocha, MD    Chief Complaint  Patient presents with  . Follow-up    1 year     History of Present Illness: Michele Burns is a 81 y.o. female who presents for follow-up evaluation. She has CAD and underwent PCI of the right coronary artery in 2014 after presenting with symptoms of unstable angina. She was treated with overlapping drug-eluting stents and adjunctive rotational atherectomy of the RCA. Her left ventricular ejection fraction was 55-60%.  She is doing well from a cardiac perspective. Today, she denies symptoms of palpitations, chest pain, shortness of breath, orthopnea, PND, lower extremity edema, dizziness, or syncope.   Past Medical History:  Diagnosis Date  . Cecal volvulus ([place]) 06/29/2014  . Coronary artery disease    a. Cath 11/17/12 - severe Ca++ moderately severe disease in the mid RCA, otherwise nonobstructive disease - s/p PTCA/rotational atherectomy & 2 DES to mid RCA 11/22/12.  Marland Kitchen DEPRESSION   . Erosive gastritis 2006   NSAID precipitated, resolved on f/u EGD  . Hx of adenomatous colonic polyps 02/10/2015  . Hx of colonic polyp 2006   diminutive '06, no polyps on 4/09 colo - for repeat 4/14  . Hyperlipidemia   . Hypertension   . IBS (irritable bowel syndrome)   . MIGRAINE HEADACHE   . POSTMENOPAUSAL SYNDROME   . PULMONARY NODULE   . PVD    normal ABI/TBI 09/2009, chronic L toe pain and vasospasm (Raynauds)  . RENAL CYST   . Sinus bradycardia    a. not on BB due to this.    Past Surgical History:  Procedure Laterality Date  . ABDOMINAL HYSTERECTOMY    . artherectomy  11/22/2012   RCA  . COLONOSCOPY    . ESOPHAGOGASTRODUODENOSCOPY    . [place]   L 3/4 toe space  . LAPAROSCOPIC RIGHT HEMI COLECTOMY  06/29/2014  . LAPAROTOMY N/A 06/29/2014   Procedure: EXPLORATORY LAPAROTOMY;   Surgeon: Stark Klein, MD;  Location: [facility];  Service: General;  Laterality: N/A;  . LEFT HEART CATHETERIZATION WITH CORONARY ANGIOGRAM N/A 11/17/2012   Procedure: LEFT HEART CATHETERIZATION WITH CORONARY ANGIOGRAM;  Surgeon: Peter M Martinique, MD;  Location: [facility] CATH LAB;  Service: Cardiovascular;  Laterality: N/A;  . PARTIAL COLECTOMY N/A 06/29/2014   Procedure: PARTIAL COLECTOMY;  Surgeon: Stark Klein, MD;  Location: [facility];  Service: General;  Laterality: N/A;  . PERCUTANEOUS CORONARY ROTOBLATOR INTERVENTION (PCI-R)  11/22/2012   Procedure: PERCUTANEOUS CORONARY ROTOBLATOR INTERVENTION (PCI-R);  Surgeon: Peter M Martinique, MD;  Location: [facility] CATH LAB;  Service: Cardiovascular;;  . PERCUTANEOUS CORONARY STENT INTERVENTION (PCI-S) N/A 11/22/2012   Procedure: PERCUTANEOUS CORONARY STENT INTERVENTION (PCI-S);  Surgeon: Peter M Martinique, MD;  Location: [facility] CATH LAB;  Service: Cardiovascular;  Laterality: N/A;  . PTCA    . TONSILLECTOMY     As a child  . TONSILLECTOMY    . VAGINAL HYSTERECTOMY      Current Outpatient Prescriptions  Medication Sig Dispense Refill  . aspirin EC 81 MG tablet Take 162 mg by mouth daily.     Marland Kitchen escitalopram (LEXAPRO) 20 MG tablet Take 1 tablet (20 mg total) by mouth daily. 90 tablet 3  . losartan (COZAAR) 50 MG tablet TAKE 1 TABLET BY MOUTH EVERY MORNING 90 tablet 2  . simvastatin (ZOCOR) 40 MG tablet  TAKE 1 BY MOUTH AT BEDTIME 90 tablet 3  . zaleplon (SONATA) 10 MG capsule TAKE ONE CAPSULE BY MOUTH AT BEDTIME AS NEEDED FOR SLEEP 30 capsule 0   No current facility-administered medications for this visit.     Allergies:   Codeine and Cortisone   Social History:  The patient  reports that she quit smoking about 36 years ago. Her smoking use included Cigarettes. She quit after 30.00 years of use. She has never used smokeless tobacco. She reports that she drinks about 6.0 oz of alcohol per week . She reports that she does not use drugs.   Family History:  The patient's  family  history includes Diabetes in her mother and paternal grandmother; Diabetes type II in her mother; Healthy in her daughter and son; Heart attack (age of onset: 65) in her brother; Heart attack (age of onset: 39) in her father; Heart attack (age of onset: 55) in her mother.    ROS:  Please see the history of present illness.  Otherwise, review of systems is positive for depression.  All other systems are reviewed and negative.    PHYSICAL EXAM: VS:  BP 120/64   Pulse (!) 56   Ht 5\' 7"  (1.702 m)   Wt 131 lb 6.4 oz (59.6 kg)   BMI 20.58 kg/m  , BMI Body mass index is 20.58 kg/m. GEN: Well nourished, well developed, in no acute distress  HEENT: normal  Neck: no JVD, no masses. No carotid bruits Cardiac: RRR without murmur or gallop                Respiratory:  clear to auscultation bilaterally, normal work of breathing GI: soft, nontender, nondistended, + BS MS: no deformity or atrophy  Ext: no pretibial edema, pedal pulses 2+= bilaterally Skin: warm and dry, no rash Neuro:  Strength and sensation are intact Psych: euthymic mood, full affect  EKG:  EKG is not ordered today.  Recent Labs: No results found for requested labs within last 8760 hours.   Lipid Panel     Component Value Date/Time   CHOL 180 12/19/2015 1157   TRIG 52.0 12/19/2015 1157   TRIG 78 04/17/2009   HDL 98.50 12/19/2015 1157   CHOLHDL 2 12/19/2015 1157   VLDL 10.4 12/19/2015 1157   LDLCALC 71 12/19/2015 1157      Wt Readings from Last 3 Encounters:  03/29/17 131 lb 6.4 oz (59.6 kg)  03/24/16 127 lb 1.9 oz (57.7 kg)  01/02/16 127 lb 4 oz (57.7 kg)    Stress Test 12/20/2015: Study Highlights    Nuclear stress EF: 59%.  There was no ST segment deviation noted during stress.  The study is normal.  This is a low risk study.  The left ventricular ejection fraction is normal (55-65%).   Normal exercise stress test with no prior infarct and no ischemia. Normal BP response to stress. Excellent  exercise capacity.    ASSESSMENT AND PLAN: 1.  CAD, native vessel: no angina. Exercising regularly without exertional symptoms. Medications reviewed and will be continued without change.  2. Essential HTN: BP controlled on losartan.  3. Hyperlipidemia: treated with simvastatin.   Current medicines are reviewed with the patient today.  The patient does not have concerns regarding medicines.  Labs/ tests ordered today include:  No orders of the defined types were placed in this encounter.  Disposition:   FU one year  Signed, Michele Mocha, MD  03/29/2017 11:22 AM    [facility] [address], [place], [place]  [address] Phone: [phone]; Fax: [phone]

## 2017-04-20 ENCOUNTER — Ambulatory Visit (INDEPENDENT_AMBULATORY_CARE_PROVIDER_SITE_OTHER): Payer: Medicare Other | Admitting: *Deleted

## 2017-04-20 DIAGNOSIS — E538 Deficiency of other specified B group vitamins: Secondary | ICD-10-CM

## 2017-04-20 MED ORDER — CYANOCOBALAMIN 1000 MCG/ML IJ SOLN
1000.0000 ug | Freq: Once | INTRAMUSCULAR | Status: AC
  Administered 2017-04-20: 1000 ug via INTRAMUSCULAR

## 2017-05-26 ENCOUNTER — Ambulatory Visit (INDEPENDENT_AMBULATORY_CARE_PROVIDER_SITE_OTHER): Payer: Medicare Other

## 2017-05-26 DIAGNOSIS — E538 Deficiency of other specified B group vitamins: Secondary | ICD-10-CM

## 2017-05-26 MED ORDER — CYANOCOBALAMIN 1000 MCG/ML IJ SOLN
1000.0000 ug | Freq: Once | INTRAMUSCULAR | Status: AC
  Administered 2017-05-26: 1000 ug via INTRAMUSCULAR

## 2017-05-31 ENCOUNTER — Ambulatory Visit: Payer: Medicare Other | Admitting: Neurology

## 2017-06-04 ENCOUNTER — Other Ambulatory Visit: Payer: Self-pay | Admitting: Cardiovascular Disease

## 2017-06-07 ENCOUNTER — Other Ambulatory Visit (INDEPENDENT_AMBULATORY_CARE_PROVIDER_SITE_OTHER): Payer: Medicare Other

## 2017-06-07 ENCOUNTER — Ambulatory Visit (INDEPENDENT_AMBULATORY_CARE_PROVIDER_SITE_OTHER): Payer: Medicare Other | Admitting: Neurology

## 2017-06-07 ENCOUNTER — Encounter: Payer: Self-pay | Admitting: Neurology

## 2017-06-07 VITALS — BP 110/60 | HR 51 | Ht 67.0 in | Wt 132.2 lb

## 2017-06-07 DIAGNOSIS — E538 Deficiency of other specified B group vitamins: Secondary | ICD-10-CM

## 2017-06-07 DIAGNOSIS — G3184 Mild cognitive impairment, so stated: Secondary | ICD-10-CM

## 2017-06-07 LAB — VITAMIN B12

## 2017-06-07 NOTE — Patient Instructions (Signed)
We will check your vitamin B12 levels.   If it remains low, we will continue you in injections.   If they are normal, then start vitamin B12 1077mcg daily over the counter

## 2017-06-07 NOTE — Progress Notes (Signed)
Follow-up Visit   Date: 06/07/17    Michele Burns MRN: [id number] DOB: 12-03-1935   Interim History: Michele Burns is a 81 y.o. right-handed Caucasian female with CAD, depression, hyperlipemia, and insomnia returning to the clinic for follow-up of memory changes in the setting of B12 deficiency.  The patient was accompanied to the clinic by self.  History of present illness: Initial visit 01/02/2016:  MEMORY LOSS.  Around the fall 2016, she began noticing that she was having word-finding difficulty. The other day, she was talking on the phone with her daughter and could not remember the word "yoga" or unable to remember which building her son lived in.  She denies problems with memory.  She lives alone and manages all her own ADLs and IADLs.  She drives and has not been involved in any MVAs.  She stays very active and goes to the gym several times per week and still works part-time.  She denies any neck or back pain, incontinence, or behavior changes.   TINNITUS.  Starting around October 2016, she began noticing bilateral buzzing noise in her hear, which is more noticeable when resting to sleep. She denies headache, hearing loss, ear pain, vision change, numbness/tingling, or imbalance.  She has not seen ENT.    UPDATE 06/07/2017:  She is here for follow-up visit.  At her last visit in March 2017, she was found to have B12 deficiency (160) and has been getting monthly injections.  She has noticed marked improvement with getting these injections with respect to her memory.  She manages her own ADLs and IADLs.  No issues with driving.  In fact, she  continues to work about 12-18 hours per week at [employer].  She stays active and goes to the [facility] when able.   Medications:  Current Outpatient Prescriptions on File Prior to Visit  Medication Sig Dispense Refill  . aspirin EC 81 MG tablet Take 1 tablet (81 mg total) by mouth daily.    Marland Kitchen escitalopram (LEXAPRO) 20 MG tablet Take 1  tablet (20 mg total) by mouth daily. 90 tablet 3  . losartan (COZAAR) 50 MG tablet TAKE 1 TABLET BY MOUTH EVERY MORNING 90 tablet 2  . simvastatin (ZOCOR) 40 MG tablet TAKE 1 BY MOUTH AT BEDTIME 90 tablet 3  . zaleplon (SONATA) 10 MG capsule TAKE ONE CAPSULE BY MOUTH AT BEDTIME AS NEEDED FOR SLEEP 30 capsule 0   No current facility-administered medications on file prior to visit.     Allergies:  Allergies  Allergen Reactions  . Codeine     REACTION: severe nausea  . Cortisone Nausea Only    Review of Systems:  CONSTITUTIONAL: No fevers, chills, night sweats, or weight loss.  EYES: No visual changes or eye pain ENT: No hearing changes.  No history of nose bleeds.   RESPIRATORY: No cough, wheezing and shortness of breath.   CARDIOVASCULAR: Negative for chest pain, and palpitations.   GI: Negative for abdominal discomfort, blood in stools or black stools.  No recent change in bowel habits.   GU:  No history of incontinence.   MUSCLOSKELETAL: No history of joint pain or swelling.  No myalgias.   SKIN: Negative for lesions, rash, and itching.   ENDOCRINE: Negative for cold or heat intolerance, polydipsia or goiter.   PSYCH:  No depression or anxiety symptoms.   NEURO: As Above.   Vital Signs:  BP 110/60   Pulse (!) 51   Ht 5\' 7"  (1.702  m)   Wt 132 lb 4 oz (60 kg)   SpO2 93%   BMI 20.71 kg/m   Neurological Exam: MENTAL STATUS including orientation to time, place, person, recent and remote memory, attention span and concentration, language, and fund of knowledge is normal.  Speech is not dysarthric. Montreal Cognitive Assessment  06/07/2017 01/02/2016  Visuospatial/ Executive (0/5) 4 2  Naming (0/3) 2 2  Attention: Read list of digits (0/2) 2 2  Attention: Read list of letters (0/1) 1 1  Attention: Serial 7 subtraction starting at 100 (0/3) 1 1  Language: Repeat phrase (0/2) 2 2  Language : Fluency (0/1) 0 0  Abstraction (0/2) 2 1  Delayed Recall (0/5) 0 0  Orientation  (0/6) 6 6  Total 20 17  Adjusted Score (based on education) 20 17   CRANIAL NERVES: Pupils equal round and reactive to light.  Normal conjugate, extra-ocular eye movements in all directions of gaze.  No ptosis. Normal facial sensation.  Face is symmetric. Palate elevates symmetrically.  Tongue is midline.  MOTOR:  Motor strength is 5/5 in all extremities.  No pronator drift.  Tone is normal.    MSRs:  Reflexes are 2+/4 throughout  SENSORY:  Intact to vibration.  COORDINATION/GAIT:  Normal finger-to- nose-finger.  Gait narrow based and stable.   Data: Lab Results  Component Value Date   VITAMINB12 160 (L) 01/02/2016   Lab Results  Component Value Date   TSH 2.53 01/02/2016    IMPRESSION/PLAN: Cognitive impairment, age-appropriate and contributed by vitamin B12 deficiency. She has been getting B12 injections for > 1 year, so I will recheck her levels to see if we can transition her to oral supplementation.  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 She denies any ongoing cognitive complaints.  She is in great health for her age and I encouraged her to continue to remain physically and cognitively active.  The duration of this appointment visit was 25 minutes of face-to-face time with the patient.  Greater than 50% of this time was spent in counseling, explanation of diagnosis, planning of further management, and coordination of care.   Thank you for allowing me to participate in patient's care.  If I can answer any additional questions, I would be pleased to do so.    Sincerely,    Donika K. Posey Pronto, DO

## 2017-06-08 ENCOUNTER — Telehealth: Payer: Self-pay | Admitting: *Deleted

## 2017-06-08 NOTE — Telephone Encounter (Signed)
-----   Message from Alda Berthold, DO sent at 06/08/2017  9:55 AM EDT ----- Please inform patient that her vitamin B12 levels look great, ok to start OTC B12 1034mcg daily.  If she develops any new symptoms, call the office. Thanks.

## 2017-06-08 NOTE — Telephone Encounter (Signed)
Patient given results and instructions.   

## 2017-06-10 ENCOUNTER — Ambulatory Visit: Payer: Medicare Other | Admitting: Neurology

## 2017-07-08 IMAGING — MR MR HEAD W/O CM
10 series · 48 of 48 positions shown · non-contrast
Comparison: None.

CLINICAL DATA: Memory loss and confusion, 6 months duration.

EXAM:
MRI HEAD WITHOUT CONTRAST
TECHNIQUE: Multiplanar, multiecho pulse sequences of the brain and surrounding
structures were obtained without intravenous contrast.

[Series 2: DWI · axial · 3.0mm · 1.80mm/px · z∈[-83,+63]mm · 9 of 99 slices shown (1 of 4)]
[im 1/99]
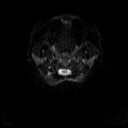
[im 13/99]
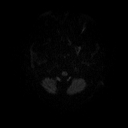
[im 25/99]
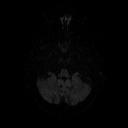
[im 37/99]
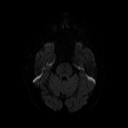
[im 50/99]
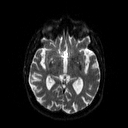
[im 62/99]
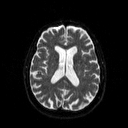
[im 74/99]
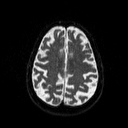
[im 86/99]
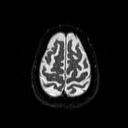
[im 99/99]
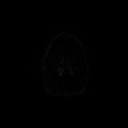

[Series 3: DWI · axial · 3.0mm · 1.80mm/px · z∈[-83,+63]mm · 5 of 50 slices shown (2 of 4)]
[im 1/50]
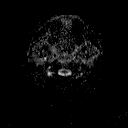
[im 13/50]
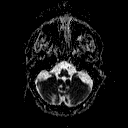
[im 25/50]
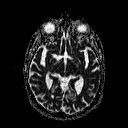
[im 37/50]
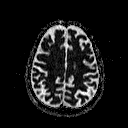
[im 50/50]
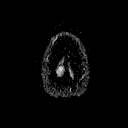

[Series 4: T1 · sagittal · 5.0mm · 0.45mm/px · 2 of 21 slices shown]
[im 1/21]
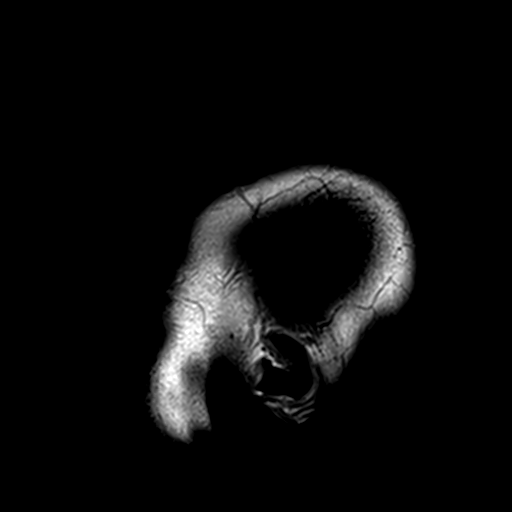
[im 21/21]
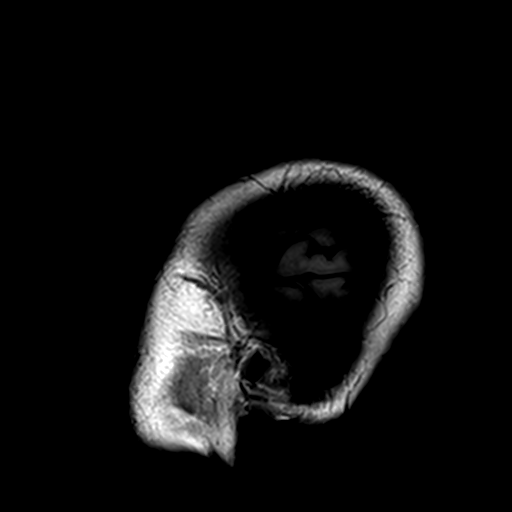

[Series 6: swi_images · axial · 2.0mm · 0.90mm/px · z∈[-88,+68]mm · 8 of 80 slices shown]
[im 1/80]
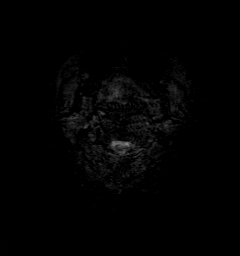
[im 12/80]
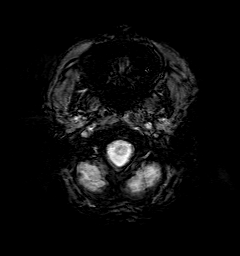
[im 23/80]
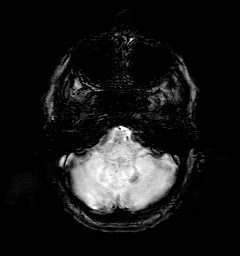
[im 34/80]
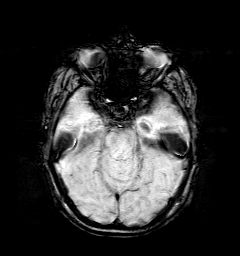
[im 46/80]
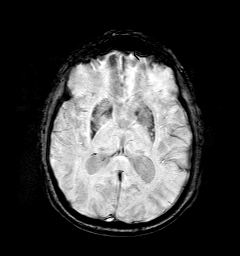
[im 57/80]
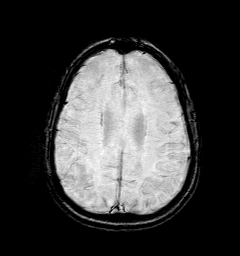
[im 68/80]
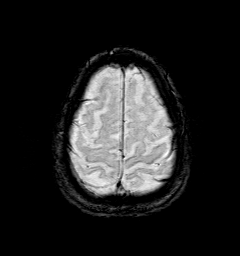
[im 80/80]
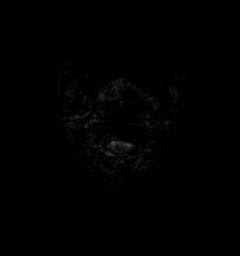

[Series 7: DWI · coronal · 5.0mm · 1.80mm/px · 7 of 68 slices shown (3 of 4)]
[im 1/68]
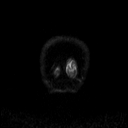
[im 12/68]
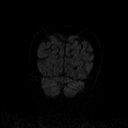
[im 23/68]
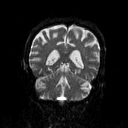
[im 34/68]
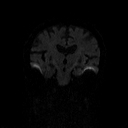
[im 45/68]
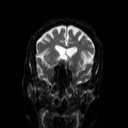
[im 56/68]
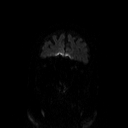
[im 68/68]
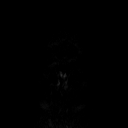

[Series 8: DWI · coronal · 5.0mm · 1.80mm/px · 3 of 34 slices shown (4 of 4)]
[im 1/34]
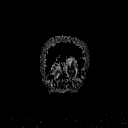
[im 17/34]
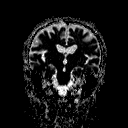
[im 34/34]
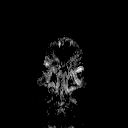

[Series 9: T2 · axial · 5.0mm · 0.51mm/px · z∈[-81,+59]mm · 2 of 22 slices shown (1 of 2)]
[im 1/22]
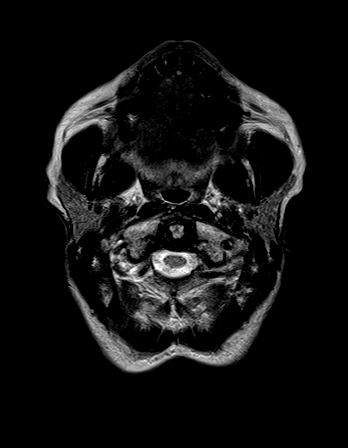
[im 22/22]
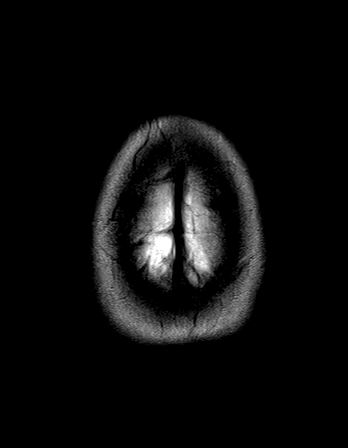

[Series 10: FLAIR · axial · 5.0mm · 0.45mm/px · z∈[-80,+60]mm · 2 of 22 slices shown]
[im 1/22]
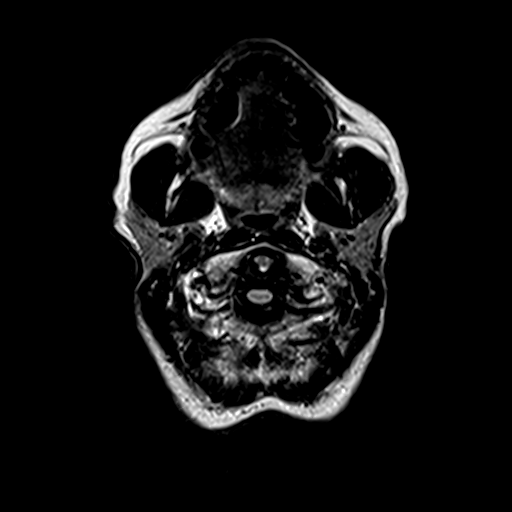
[im 22/22]
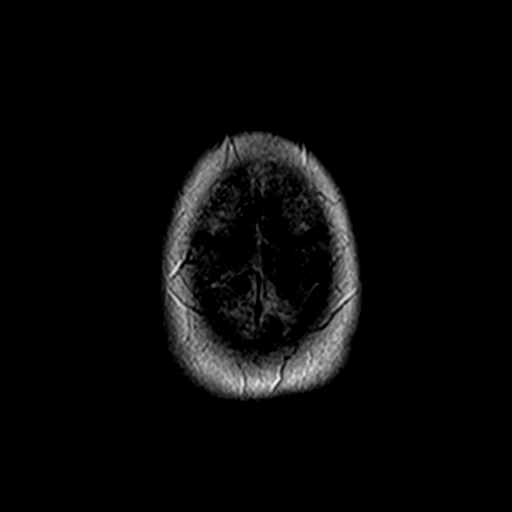

[Series 11: t1_mpr_tra · axial · 2.0mm · 0.45mm/px · z∈[-90,+67]mm · 8 of 80 slices shown]
[im 1/80]
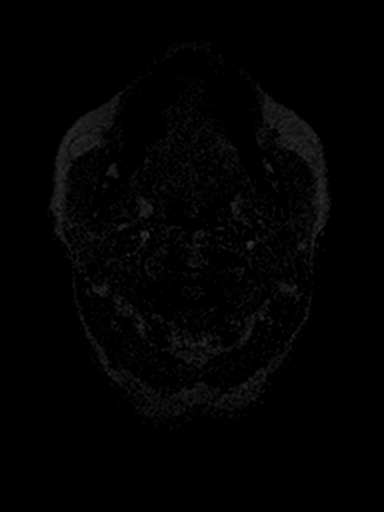
[im 12/80]
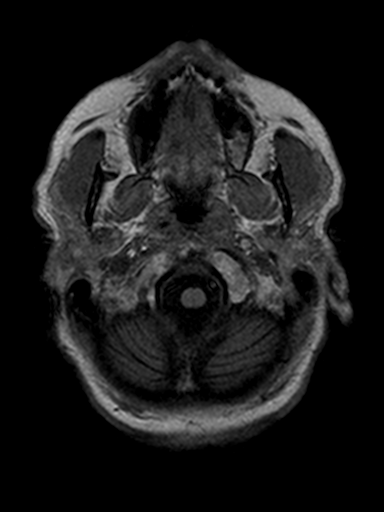
[im 23/80]
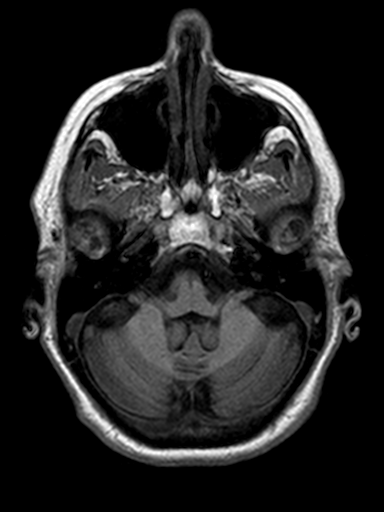
[im 34/80]
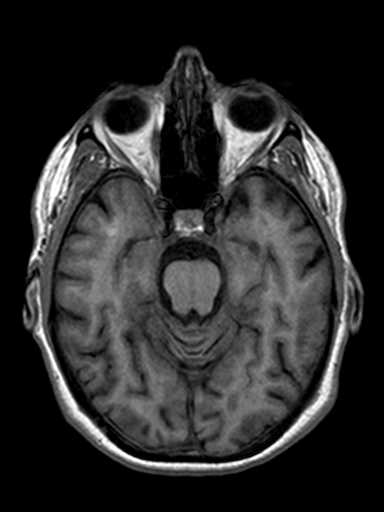
[im 46/80]
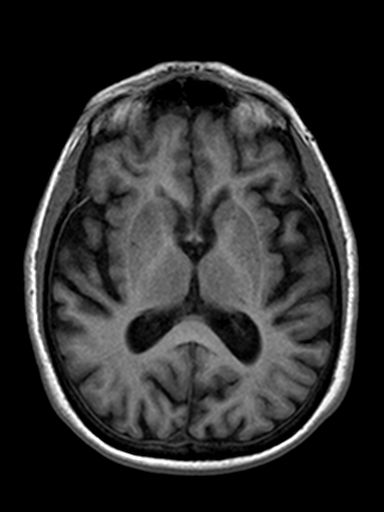
[im 57/80]
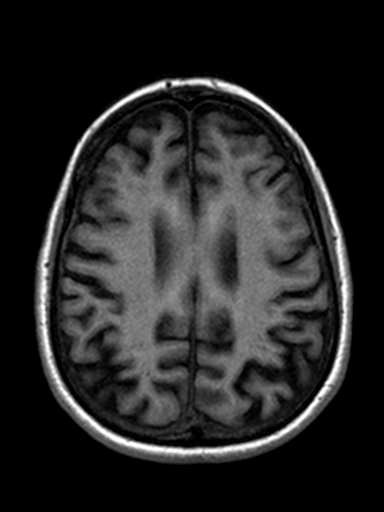
[im 68/80]
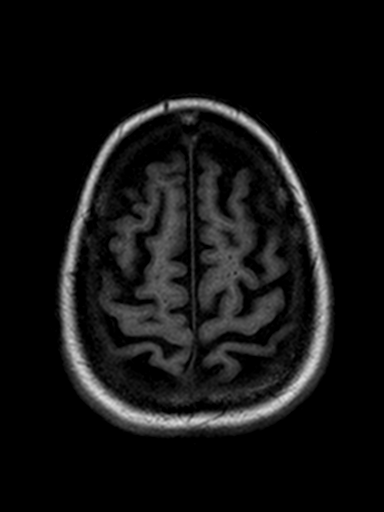
[im 80/80]
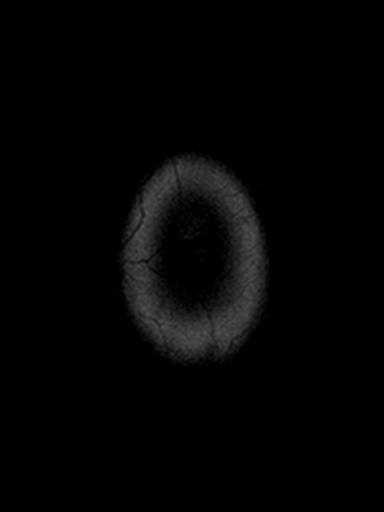

[Series 12: T2 · coronal · 5.0mm · 0.45mm/px · 2 of 25 slices shown (2 of 2)]
[im 1/25]
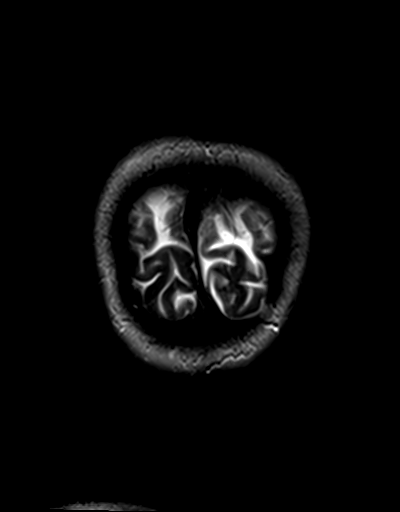
[im 25/25]
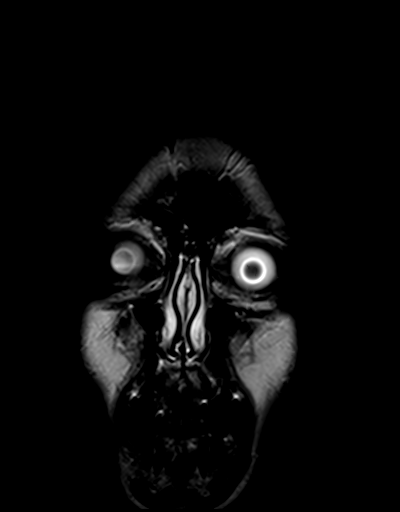

[48 of 48 positions shown; findings below may reference images not displayed]

FINDINGS: Diffusion imaging does not show any acute or subacute infarction.
The brainstem and cerebellum are normal. Cerebral hemispheres show a
few scattered punctate foci of T2 and FLAIR signal in the white
matter consistent with minimal small vessel change, less than often
seen in healthy individuals of this age. No cortical or large vessel
territory infarction. No mass lesion, hemorrhage, hydrocephalus or
extra-axial collection. No pituitary mass. No significant sinus
disease. No skull or skullbase lesion. Major vessels at the base of
the brain show flow.
IMPRESSION: No acute or reversible finding. No cause of the presenting symptoms
is identified. Mild age related atrophy and minimal small vessel
change of the white matter, less than often seen in healthy
individuals of this age.

## 2017-09-07 DIAGNOSIS — Z23 Encounter for immunization: Secondary | ICD-10-CM | POA: Diagnosis not present

## 2017-12-20 DIAGNOSIS — I10 Essential (primary) hypertension: Secondary | ICD-10-CM | POA: Diagnosis not present

## 2017-12-20 DIAGNOSIS — E7849 Other hyperlipidemia: Secondary | ICD-10-CM | POA: Diagnosis not present

## 2017-12-20 DIAGNOSIS — R82998 Other abnormal findings in urine: Secondary | ICD-10-CM | POA: Diagnosis not present

## 2017-12-29 DIAGNOSIS — I10 Essential (primary) hypertension: Secondary | ICD-10-CM | POA: Diagnosis not present

## 2017-12-29 DIAGNOSIS — E538 Deficiency of other specified B group vitamins: Secondary | ICD-10-CM | POA: Diagnosis not present

## 2017-12-29 DIAGNOSIS — R413 Other amnesia: Secondary | ICD-10-CM | POA: Diagnosis not present

## 2017-12-29 DIAGNOSIS — Z0000 Encounter for general adult medical examination without abnormal findings: Secondary | ICD-10-CM | POA: Diagnosis not present

## 2018-01-07 DIAGNOSIS — Z1212 Encounter for screening for malignant neoplasm of rectum: Secondary | ICD-10-CM | POA: Diagnosis not present

## 2018-03-22 DIAGNOSIS — I25118 Atherosclerotic heart disease of native coronary artery with other forms of angina pectoris: Secondary | ICD-10-CM | POA: Diagnosis not present

## 2018-03-22 DIAGNOSIS — E7849 Other hyperlipidemia: Secondary | ICD-10-CM | POA: Diagnosis not present

## 2018-03-22 DIAGNOSIS — I10 Essential (primary) hypertension: Secondary | ICD-10-CM | POA: Diagnosis not present

## 2018-03-22 DIAGNOSIS — R002 Palpitations: Secondary | ICD-10-CM | POA: Diagnosis not present

## 2018-04-12 ENCOUNTER — Other Ambulatory Visit: Payer: Self-pay | Admitting: Cardiovascular Disease

## 2018-04-12 NOTE — Telephone Encounter (Signed)
Pt has an appt tomorrow 7/3

## 2018-04-12 NOTE — Progress Notes (Signed)
Cardiology Office Note:    Date:  04/13/2018   ID:  Michele Burns, DOB 20-Nov-1935, MRN [id number]  PCP:  [name], MD  Cardiologist:  Sherren Mocha, MD   Referring MD: [name], MD   Chief Complaint  Patient presents with  . Follow-up    CAD    History of Present Illness:    Michele Burns is a 82 y.o. female with coronary artery disease s/p rotational atherectomy and overlapping drug eluting stents to the RCA in 2014, hypertension, hyperlipidemia, sinus bradycardia.  Last seen by Dr. Sherren Mocha in 03/2017.    Michele Burns returns for follow-up on coronary artery disease.  She is here alone.  She exercises about twice a week.  She denies chest discomfort, shortness of breath, paroxysmal nocturnal dyspnea, lower extremity swelling or syncope.  Prior CV studies:   The following studies were reviewed today:  Nuclear stress test 12/20/15 Normal exercise stress test with no prior infarct and no ischemia. Normal BP response to stress. Excellent exercise capacity. EF 59 Low Risk  Stress Echo 03/04/15 Normal stress echocardiogram. Excellent exercise capacity. Normal BP response.  Nuclear stress test 11/17/12 Intermediate stress nuclear study. There is a medium size area of moderate reversible ischemia involving the basal inferior, mid-inferior, and apical inferior and basal inferoseptal segments. EKG reveals significant ischemic ST depression in inferolateral leads.  LV Ejection Fraction: 60%.  Cardiac Catheterization 11/22/12 PCI:  Rotational Atherectomy, 3x32 mm Promus DES and 2.75x20 mm Promus DES to RCA  Cardiac Catheterization 11/17/12 LM calcified LAD ostial 30, mid 40, D2 40-50 LCx mid 30-40 RCA mid 19 EF 55-60   Past Medical History:  Diagnosis Date  . Cecal volvulus ([place]) 06/29/2014  . Coronary artery disease    a. Cath 11/17/12 - severe Ca++ moderately severe disease in the mid RCA, otherwise nonobstructive disease - s/p PTCA/rotational atherectomy  & 2 DES to mid RCA 11/22/12.  Marland Kitchen DEPRESSION   . Erosive gastritis 2006   NSAID precipitated, resolved on f/u EGD  . Hx of adenomatous colonic polyps 02/10/2015  . Hx of colonic polyp 2006   diminutive '06, no polyps on 4/09 colo - for repeat 4/14  . Hyperlipidemia   . Hypertension   . IBS (irritable bowel syndrome)   . MIGRAINE HEADACHE   . POSTMENOPAUSAL SYNDROME   . PULMONARY NODULE   . PVD    normal ABI/TBI 09/2009, chronic L toe pain and vasospasm (Raynauds)  . RENAL CYST   . Sinus bradycardia    a. not on BB due to this.   Surgical Hx: The patient  has a past surgical history that includes Tonsillectomy; Vaginal hysterectomy; Excision Morton's neuroma (1971); artherectomy (11/22/2012); Abdominal hysterectomy; Tonsillectomy; Esophagogastroduodenoscopy; Colonoscopy; Mitral valve replacement; Laparoscopic right hemi colectomy (06/29/2014); laparotomy (N/A, 06/29/2014); Partial colectomy (N/A, 06/29/2014); left heart catheterization with coronary angiogram (N/A, 11/17/2012); percutaneous coronary stent intervention (pci-s) (N/A, 11/22/2012); and percutaneous coronary rotoblator intervention (pci-r) (11/22/2012).   Current Medications: Current Meds  Medication Sig  . aspirin EC 81 MG tablet Take 1 tablet (81 mg total) by mouth daily.  Marland Kitchen escitalopram (LEXAPRO) 20 MG tablet Take 1 tablet (20 mg total) by mouth daily.  Marland Kitchen losartan (COZAAR) 50 MG tablet Take 1 tablet (50 mg total) by mouth every morning.  . simvastatin (ZOCOR) 40 MG tablet TAKE 1 BY MOUTH AT BEDTIME  . zaleplon (SONATA) 10 MG capsule TAKE ONE CAPSULE BY MOUTH AT BEDTIME AS NEEDED FOR SLEEP  . [DISCONTINUED] losartan (COZAAR) 50  MG tablet TAKE 1 TABLET BY MOUTH EVERY MORNING     Allergies:   Codeine and Cortisone   Social History   Tobacco Use  . Smoking status: Former Smoker    Years: 30.00    Types: Cigarettes    Last attempt to quit: 10/12/1980    Years since quitting: 37.5  . Smokeless tobacco: Never Used  . Tobacco  comment: She is widowed since 86-she has 3 grown children and 5 g-kids. Moved to [place] from [place] in 2010 to be close to family  Substance Use Topics  . Alcohol use: Yes    Alcohol/week: 6.0 oz    Types: 10 Glasses of wine per week    Comment: <2 /day, white wine twice daily  . Drug use: No     Family Hx: The patient's family history includes Diabetes in her mother and paternal grandmother; Diabetes type II in her mother; Healthy in her daughter and son; Heart attack (age of onset: 61) in her brother; Heart attack (age of onset: 65) in her father; Heart attack (age of onset: 14) in her mother. There is no history of Colon cancer, Colon polyps, Kidney disease, Esophageal cancer, Gallbladder disease, Hypertension, or Stroke.  ROS:   Please see the history of present illness.    ROS All other systems reviewed and are negative.   EKGs/Labs/Other Test Reviewed:    EKG:  EKG is  ordered today.  The ekg ordered today demonstrates sinus bradycardia, heart rate 49, left axis deviation, QRS 98, QTc 437, septal Q waves, similar to prior tracing dated 12/10/2015  Recent Labs: No results found for requested labs within last 8760 hours.   From [facility] Tool: Cholesterol, total  170.000 m  12/20/2017 HDL    82 MG/DL  12/20/2017 LDL    76.000 mg  12/20/2017 Triglycerides  59.000  12/20/2017 Hemoglobin   12.400 g/  12/20/2017 Creatinine, Serum  0.900 mg/  03/22/2018 Potassium   4.700   12/19/2015 ALT (SGPT)   34.000 uni  12/20/2017 TSH    2.350   03/22/2018 Platelets   201.000  12/19/2015   Recent Lipid Panel Lab Results  Component Value Date/Time   CHOL 180 12/19/2015 11:57 AM   TRIG 52.0 12/19/2015 11:57 AM   TRIG 78 04/17/2009   HDL 98.50 12/19/2015 11:57 AM   CHOLHDL 2 12/19/2015 11:57 AM   LDLCALC 71 12/19/2015 11:57 AM    Physical Exam:    VS:  BP 128/70   Pulse (!) 49   Ht 5\' 7"  (1.702 m)   Wt 133 lb (60.3 kg)   SpO2 97%   BMI 20.83 kg/m     Wt Readings from Last 3 Encounters:    04/13/18 133 lb (60.3 kg)  06/07/17 132 lb 4 oz (60 kg)  03/29/17 131 lb 6.4 oz (59.6 kg)     Physical Exam  Constitutional: She is oriented to person, place, and time. She appears well-developed and well-nourished. No distress.  HENT:  Head: Normocephalic and atraumatic.  Neck: No JVD present. Carotid bruit is not present.  Cardiovascular: Regular rhythm and normal heart sounds. Bradycardia present.  No murmur heard. Pulmonary/Chest: Effort normal and breath sounds normal. She has no rales.  Abdominal: Soft. There is no hepatomegaly.  Musculoskeletal: She exhibits no edema.  Neurological: She is alert and oriented to person, place, and time.  Skin: Skin is warm and dry.    ASSESSMENT & PLAN:    Coronary artery disease involving native coronary artery of  native heart without angina pectoris History of drug-eluting stent x2 to the RCA in 2014.  Low risk nuclear stress test in 2017.  She is doing well without anginal symptoms.  She is not on beta-blocker as she is bradycardic.  Continue aspirin, statin.  Essential hypertension The patient's blood pressure is controlled on her current regimen.  Continue current therapy.   Hyperlipidemia, unspecified hyperlipidemia type LDL optimal on most recent lab work.  Continue current Rx.     Dispo:  Return in about 1 year (around 04/14/2019) for Routine Follow Up, w/ Dr. Burt Knack, or Richardson Dopp, PA-C.   Medication Adjustments/Labs and Tests Ordered: Current medicines are reviewed at length with the patient today.  Concerns regarding medicines are outlined above.  Tests Ordered: Orders Placed This Encounter  Procedures  . EKG 12-Lead   Medication Changes: Meds ordered this encounter  Medications  . losartan (COZAAR) 50 MG tablet    Sig: Take 1 tablet (50 mg total) by mouth every morning.    Dispense:  90 tablet    Refill:  3    Signed, Richardson Dopp, PA-C  04/13/2018 9:34 AM    [facility] [address],  [place], [place]  [address] Phone: [phone]; Fax: [phone]

## 2018-04-13 ENCOUNTER — Encounter: Payer: Self-pay | Admitting: Physician Assistant

## 2018-04-13 ENCOUNTER — Ambulatory Visit: Payer: Medicare Other | Admitting: Physician Assistant

## 2018-04-13 ENCOUNTER — Encounter (INDEPENDENT_AMBULATORY_CARE_PROVIDER_SITE_OTHER): Payer: Self-pay

## 2018-04-13 VITALS — BP 128/70 | HR 49 | Ht 67.0 in | Wt 133.0 lb

## 2018-04-13 DIAGNOSIS — I2510 Atherosclerotic heart disease of native coronary artery without angina pectoris: Secondary | ICD-10-CM | POA: Diagnosis not present

## 2018-04-13 DIAGNOSIS — I10 Essential (primary) hypertension: Secondary | ICD-10-CM | POA: Diagnosis not present

## 2018-04-13 DIAGNOSIS — E785 Hyperlipidemia, unspecified: Secondary | ICD-10-CM | POA: Diagnosis not present

## 2018-04-13 MED ORDER — LOSARTAN POTASSIUM 50 MG PO TABS: 50.0000 mg | ORAL_TABLET | Freq: Every morning | ORAL | 3 refills | Status: DC

## 2018-04-13 NOTE — Patient Instructions (Signed)
Medication Instructions:  1. A REFILL WAS SENT IN FOR LOSARTAN   Labwork: NONE ORDERED TODAY  Testing/Procedures: NONE ORDERED TODAY  Follow-Up: DR. Burt Knack IN 1 YEAR ;  Any Other Special Instructions Will Be Listed Below (If Applicable).     If you need a refill on your cardiac medications before your next appointment, please call your pharmacy.

## 2018-04-13 NOTE — Telephone Encounter (Signed)
Outpatient Medication Detail    Disp Refills Start End   losartan (COZAAR) 50 MG tablet 90 tablet 3 04/13/2018    Sig - Route: Take 1 tablet (50 mg total) by mouth every morning. - Oral   Sent to pharmacy as: losartan (COZAAR) 50 MG tablet   E-Prescribing Status: Receipt confirmed by pharmacy (04/13/2018 9:12 AM EDT)   Pharmacy   [id number] - [place], [place]

## 2018-05-03 DIAGNOSIS — Z6822 Body mass index (BMI) 22.0-22.9, adult: Secondary | ICD-10-CM | POA: Diagnosis not present

## 2018-05-03 DIAGNOSIS — G4709 Other insomnia: Secondary | ICD-10-CM | POA: Diagnosis not present

## 2018-05-03 DIAGNOSIS — F325 Major depressive disorder, single episode, in full remission: Secondary | ICD-10-CM | POA: Diagnosis not present

## 2018-05-17 ENCOUNTER — Telehealth: Payer: Self-pay | Admitting: Cardiovascular Disease

## 2018-05-17 MED ORDER — SIMVASTATIN 40 MG PO TABS: ORAL_TABLET | ORAL | 0 refills | Status: DC

## 2018-05-17 NOTE — Telephone Encounter (Signed)
Spoke with patient about her medications. She was confused about which medications she should be taking. I discussed the medications that we had on file and advised patient to contact primary care to determine if her other medications she listed, that were not on our list, were appropriate. The patient did not have any of the simvastatin. Her last recent lipid panel was on [facility], LDL: 76 on 12/20/17. I am refilling her medication, per Richardson Dopp, PA note, on continue current therapy. The refill will be enough to get to her appointment with Dr. Burt Knack to reassess effectiveness.The patient has significant short-term memory problems.

## 2018-05-17 NOTE — Telephone Encounter (Signed)
New message   Patient requesting a call from nurse to discuss medications. Patient states she does not remember seeing  Richardson Dopp this year. Patient states she turned 82 and should be taking more medications.  Advised patient that Losartan was refilled on 04/13/18. She states she didn't know about anything about that.

## 2018-05-23 DIAGNOSIS — H268 Other specified cataract: Secondary | ICD-10-CM | POA: Diagnosis not present

## 2018-05-23 DIAGNOSIS — R413 Other amnesia: Secondary | ICD-10-CM | POA: Diagnosis not present

## 2018-05-23 DIAGNOSIS — G4709 Other insomnia: Secondary | ICD-10-CM | POA: Diagnosis not present

## 2018-05-23 DIAGNOSIS — E538 Deficiency of other specified B group vitamins: Secondary | ICD-10-CM | POA: Diagnosis not present

## 2018-05-24 ENCOUNTER — Telehealth: Payer: Self-pay | Admitting: Cardiovascular Disease

## 2018-05-24 DIAGNOSIS — H43812 Vitreous degeneration, left eye: Secondary | ICD-10-CM | POA: Diagnosis not present

## 2018-05-24 NOTE — Telephone Encounter (Signed)
Left message to call back  

## 2018-05-24 NOTE — Telephone Encounter (Signed)
See other phone note from today.

## 2018-05-24 NOTE — Telephone Encounter (Signed)
New message   Patient states that she has developed floaters in her eyes. Patient would like a recommendation for a good Optomalogist in the [facility]. Please contact the patient.

## 2018-05-24 NOTE — Telephone Encounter (Signed)
New Message:    Patient would like to know if she could recommend to a Ophthalmologist Because she having some fluttering.

## 2018-05-27 NOTE — Telephone Encounter (Signed)
Late entry from 1100 this AM:  Called patient and recommended [facility] for her vision issues.  She states she has seen one doctor and was not happy with her care. She has an appointment for a second opinion in a couple weeks.   She was confused on the phone and thought I was the ophthalmology office calling despite many attempts to correct her stating my name and that I am from Dr. Antionette Char cardiology office. Once she realized who I was, she apologized. I offered to call the ophthalmology office and request an earlier appointment, but she declined.   Confirmed her appointment with Dr. Burt Knack in November. She was grateful for call.

## 2018-06-24 DIAGNOSIS — H43812 Vitreous degeneration, left eye: Secondary | ICD-10-CM | POA: Diagnosis not present

## 2018-06-24 DIAGNOSIS — H353122 Nonexudative age-related macular degeneration, left eye, intermediate dry stage: Secondary | ICD-10-CM | POA: Diagnosis not present

## 2018-07-20 DIAGNOSIS — Z23 Encounter for immunization: Secondary | ICD-10-CM | POA: Diagnosis not present

## 2018-08-19 ENCOUNTER — Other Ambulatory Visit: Payer: Self-pay | Admitting: Physician Assistant

## 2018-08-20 ENCOUNTER — Other Ambulatory Visit: Payer: Self-pay | Admitting: Physician Assistant

## 2018-08-22 ENCOUNTER — Other Ambulatory Visit: Payer: Self-pay | Admitting: Physician Assistant

## 2018-08-23 NOTE — Telephone Encounter (Signed)
Outpatient Medication Detail    Disp Refills Start End   simvastatin (ZOCOR) 40 MG tablet 90 tablet 0 08/22/2018    Sig: TAKE 1 TABLET BY MOUTH AT BEDTIME   Sent to pharmacy as: simvastatin (ZOCOR) 40 MG tablet   E-Prescribing Status: Receipt confirmed by pharmacy (08/22/2018 1:42 PM EST)   [place], [place]

## 2018-08-24 ENCOUNTER — Telehealth: Payer: Self-pay | Admitting: Cardiovascular Disease

## 2018-08-24 NOTE — Telephone Encounter (Signed)
This was sent in on 08/22/18. I called and spoke with the pharmacist and gave a verbal approval.

## 2018-08-24 NOTE — Telephone Encounter (Signed)
New message     *STAT* If patient is at the pharmacy, call can be transferred to refill team.   1. Which medications need to be refilled? (please list name of each medication and dose if known) simvastatin (ZOCOR) 40 MG tablet  2. Which pharmacy/location (including street and city if local pharmacy) is medication to be sent to?[place], [place]  3. Do they need a 30 day or 90 day supply? [place]

## 2018-08-29 ENCOUNTER — Ambulatory Visit: Payer: Medicare Other | Admitting: Cardiovascular Disease

## 2018-08-29 ENCOUNTER — Encounter: Payer: Self-pay | Admitting: Cardiovascular Disease

## 2018-08-29 VITALS — BP 110/60 | HR 49 | Ht 67.0 in | Wt 131.1 lb

## 2018-08-29 DIAGNOSIS — E782 Mixed hyperlipidemia: Secondary | ICD-10-CM

## 2018-08-29 DIAGNOSIS — I2510 Atherosclerotic heart disease of native coronary artery without angina pectoris: Secondary | ICD-10-CM

## 2018-08-29 DIAGNOSIS — I10 Essential (primary) hypertension: Secondary | ICD-10-CM | POA: Diagnosis not present

## 2018-08-29 NOTE — Patient Instructions (Signed)

## 2018-08-29 NOTE — Progress Notes (Signed)
Cardiology Office Note:    Date:  08/29/2018   ID:  Michele Burns, DOB 03-26-1936, MRN [id number]  PCP:  [name], MD  Cardiologist:  Sherren Mocha, MD  Electrophysiologist:  None   Referring MD: [name], MD   Chief Complaint  Patient presents with  . Coronary Artery Disease    History of Present Illness:    Michele Burns is a 81 y.o. female with a hx of coronary artery disease, presented for follow-up evaluation.  The patient has a history of CAD and she underwent rotational atherectomy and stenting of the RCA in 2014.  Comorbid conditions include hypertension, mixed hyperlipidemia, and sinus bradycardia.  The patient is here alone today.  She is doing well.  She continues to work at [employer] a few days per week.  She is physically active with no exertional symptoms.  She specifically denies chest pain, chest pressure, shortness of breath, or leg swelling.  She denies heart palpitations, lightheadedness, or presyncope.  She has relocated to the independent living portion of [place].    Past Medical History:  Diagnosis Date  . 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 Coronary artery disease    a. Cath 11/17/12 - severe Ca++ moderately severe disease in the mid RCA, otherwise nonobstructive disease - s/p PTCA/rotational atherectomy & 2 DES to mid RCA 11/22/12.  Marland Kitchen DEPRESSION   . Erosive gastritis 2006   NSAID precipitated, resolved on f/u EGD  . Hx of adenomatous colonic polyps 02/10/2015  . Hx of colonic polyp 2006   diminutive '06, no polyps on 4/09 colo - for repeat 4/14  . Hyperlipidemia   . Hypertension   . IBS (irritable bowel syndrome)   . MIGRAINE HEADACHE   . POSTMENOPAUSAL SYNDROME   . PULMONARY NODULE   . PVD    normal ABI/TBI 09/2009, chronic L toe pain and vasospasm (Raynauds)  . RENAL CYST   . Sinus bradycardia    a. not on BB due to this.    Past Surgical History:  Procedure Laterality Date  . ABDOMINAL HYSTERECTOMY    . artherectomy   11/22/2012   RCA  . COLONOSCOPY    . ESOPHAGOGASTRODUODENOSCOPY    . [place]   L 3/4 toe space  . LAPAROSCOPIC RIGHT HEMI COLECTOMY  06/29/2014  . LAPAROTOMY N/A 06/29/2014   Procedure: EXPLORATORY LAPAROTOMY;  Surgeon: Stark Klein, MD;  Location: [place];  Service: General;  Laterality: N/A;  . LEFT HEART CATHETERIZATION WITH CORONARY ANGIOGRAM N/A 11/17/2012   Procedure: LEFT HEART CATHETERIZATION WITH CORONARY ANGIOGRAM;  Surgeon: Peter M Martinique, MD;  Location: [facility] CATH LAB;  Service: Cardiovascular;  Laterality: N/A;  . PARTIAL COLECTOMY N/A 06/29/2014   Procedure: PARTIAL COLECTOMY;  Surgeon: Stark Klein, MD;  Location: [place];  Service: General;  Laterality: N/A;  . PERCUTANEOUS CORONARY ROTOBLATOR INTERVENTION (PCI-R)  11/22/2012   Procedure: PERCUTANEOUS CORONARY ROTOBLATOR INTERVENTION (PCI-R);  Surgeon: Peter M Martinique, MD;  Location: [facility] CATH LAB;  Service: Cardiovascular;;  . PERCUTANEOUS CORONARY STENT INTERVENTION (PCI-S) N/A 11/22/2012   Procedure: PERCUTANEOUS CORONARY STENT INTERVENTION (PCI-S);  Surgeon: Peter M Martinique, MD;  Location: [facility] CATH LAB;  Service: Cardiovascular;  Laterality: N/A;  . PTCA    . TONSILLECTOMY     As a child  . TONSILLECTOMY    . VAGINAL HYSTERECTOMY      Current Medications: Current Meds  Medication Sig  . aspirin EC 81 MG tablet Take 1 tablet (81 mg total) by mouth  daily.  . escitalopram (LEXAPRO) 20 MG tablet Take 1 tablet (20 mg total) by mouth daily.  Marland Kitchen losartan (COZAAR) 50 MG tablet Take 1 tablet (50 mg total) by mouth every morning.  . simvastatin (ZOCOR) 40 MG tablet TAKE 1 TABLET BY MOUTH AT BEDTIME  . zaleplon (SONATA) 10 MG capsule TAKE ONE CAPSULE BY MOUTH AT BEDTIME AS NEEDED FOR SLEEP     Allergies:   Codeine and Cortisone   Social History   Socioeconomic History  . Marital status: Widowed    Spouse name: Not on file  . Number of children: 3  . Years of education: Not on file  . Highest education level:  Not on file  Occupational History  . Occupation: [employer]    Employer: [employer]  Social Needs  . Financial resource strain: Not on file  . Food insecurity:    Worry: Not on file    Inability: Not on file  . Transportation needs:    Medical: Not on file    Non-medical: Not on file  Tobacco Use  . Smoking status: Former Smoker    Years: 30.00    Types: Cigarettes    Last attempt to quit: 10/12/1980    Years since quitting: 37.9  . Smokeless tobacco: Never Used  . Tobacco comment: She is widowed since 76-she has 3 grown children and 5 g-kids. Moved to [place] from [place] in 2010 to be close to family  Substance and Sexual Activity  . Alcohol use: Yes    Alcohol/week: 10.0 standard drinks    Types: 10 Glasses of wine per week    Comment: <2 /day, white wine twice daily  . Drug use: No  . Sexual activity: Not on file  Lifestyle  . Physical activity:    Days per week: Not on file    Minutes per session: Not on file  . Stress: Not on file  Relationships  . Social connections:    Talks on phone: Not on file    Gets together: Not on file    Attends religious service: Not on file    Active member of club or organization: Not on file    Attends meetings of clubs or organizations: Not on file    Relationship status: Not on file  Other Topics Concern  . Not on file  Social History Narrative   Lives alone in a one story home. Husband passed away 15 year ago. Has 3 children.     Works part time in [employer] (medical).     Education: 2 years of college.      Family History: The patient's family history includes Diabetes in her mother and paternal grandmother; Diabetes type II in her mother; Healthy in her daughter and son; Heart attack (age of onset: 51) in her brother; Heart attack (age of onset: 71) in her father; Heart attack (age of onset: 56) in her mother. There is no history of Colon cancer, Colon polyps, Kidney disease, Esophageal cancer, Gallbladder disease, Hypertension, or  Stroke.  ROS:   Please see the history of present illness.    All other systems reviewed and are negative.  EKGs/Labs/Other Studies Reviewed:    The following studies were reviewed today: Stress test 12-20-2015: Study Highlights    Nuclear stress EF: 59%.  There was no ST segment deviation noted during stress.  The study is normal.  This is a low risk study.  The left ventricular ejection fraction is normal (55-65%).   Normal exercise stress  test with no prior infarct and no ischemia. Normal BP response to stress. Excellent exercise capacity.    Recent Labs: No results found for requested labs within last 8760 hours.  Recent Lipid Panel    Component Value Date/Time   CHOL 180 12/19/2015 1157   TRIG 52.0 12/19/2015 1157   TRIG 78 04/17/2009   HDL 98.50 12/19/2015 1157   CHOLHDL 2 12/19/2015 1157   VLDL 10.4 12/19/2015 1157   LDLCALC 71 12/19/2015 1157    Physical Exam:    VS:  BP 110/60   Pulse (!) 49   Ht 5\' 7"  (1.702 m)   Wt 131 lb 1.9 oz (59.5 kg)   SpO2 98%   BMI 20.54 kg/m     Wt Readings from Last 3 Encounters:  08/29/18 131 lb 1.9 oz (59.5 kg)  04/13/18 133 lb (60.3 kg)  06/07/17 132 lb 4 oz (60 kg)     GEN:  Well nourished, well developed in no acute distress HEENT: Normal NECK: No JVD; No carotid bruits LYMPHATICS: No lymphadenopathy CARDIAC: RRR, no murmurs, rubs, gallops RESPIRATORY:  Clear to auscultation without rales, wheezing or rhonchi  ABDOMEN: Soft, non-tender, non-distended MUSCULOSKELETAL:  No edema; No deformity  SKIN: Warm and dry NEUROLOGIC:  Alert and oriented x 3 PSYCHIATRIC:  Normal affect   ASSESSMENT:    1. Coronary artery disease involving native coronary artery of native heart without angina pectoris   2. Essential hypertension   3. Mixed hyperlipidemia    PLAN:    In order of problems listed above:  1. The patient is stable without symptoms of angina.  She continues on aspirin for antiplatelet therapy and a  statin drug for lipid-lowering.  She is not a candidate for a beta-blocker because of resting bradycardia. 2. Blood pressure is well controlled on losartan. 3. Lipids are excellent on simvastatin.  Total cholesterol is 170, HDL 82, LDL 76.  She is physically active and follows a good lifestyle.   Medication Adjustments/Labs and Tests Ordered: Current medicines are reviewed at length with the patient today.  Concerns regarding medicines are outlined above.  No orders of the defined types were placed in this encounter.  No orders of the defined types were placed in this encounter.   Patient Instructions  Medication Instructions:  Your provider recommends that you continue on your current medications as directed. Please refer to the Current Medication list given to you today.    Labwork: None  Testing/Procedures: None  Follow-Up: Your provider wants you to follow-up in: 1 year with Dr. Burt Knack. You will receive a reminder letter in the mail two months in advance. If you don't receive a letter, please call our office to schedule the follow-up appointment.    Any Other Special Instructions Will Be Listed Below (If Applicable).     If you need a refill on your cardiac medications before your next appointment, please call your pharmacy.      Signed, Sherren Mocha, MD  08/29/2018 10:47 AM    [place]

## 2018-11-09 ENCOUNTER — Ambulatory Visit: Payer: Medicare Other | Admitting: Physician Assistant

## 2018-11-09 NOTE — Progress Notes (Deleted)
Cardiology Office Note:    Date:  11/09/2018   ID:  Michele Burns, DOB Dec 09, 1935, MRN [id number]  PCP:  Michele Hatchet, MD  Cardiologist:  Michele Mocha, MD *** Electrophysiologist:  None   Referring MD: Michele Hatchet, MD   No chief complaint on file. ***  History of Present Illness:    Michele Burns is a 83 y.o. female with coronary artery disease s/p rotational atherectomy and overlapping drug eluting stents to the RCA in 2014, hypertension, hyperlipidemia, sinus bradycardia.  Last seen by Dr. Burt Burns in 08/2018.   ***  [name] ***  Prior CV studies:   The following studies were reviewed today:  *** Nuclear stress test 12/20/15 Normal exercise stress test with no prior infarct and no ischemia. Normal BP response to stress. Excellent exercise capacity. EF 59 Low Risk  Stress Echo 03/04/15 Normal stress echocardiogram. Excellent exercise capacity. NormalBP response.  Nuclear stress test 11/17/12 Intermediate stress nuclear study.There is a medium size area of moderate reversible ischemia involving the basal inferior, mid-inferior, and apical inferior and basal inferoseptal segments. EKG reveals significant ischemic ST depression in inferolateral leads.  LV Ejection Fraction: 60%.  Cardiac Catheterization 11/22/12 PCI:  Rotational Atherectomy, 3x32 mm Promus DES and 2.75x20 mm Promus DES to RCA  Cardiac Catheterization 11/17/12 LM calcified LAD ostial 30, mid 40, D2 40-50 LCx mid 30-40 RCA mid 55 EF 55-60  Past Medical History:  Diagnosis Date  . 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 Coronary artery disease    a. Cath 11/17/12 - severe Ca++ moderately severe disease in the mid RCA, otherwise nonobstructive disease - s/p PTCA/rotational atherectomy & 2 DES to mid RCA 11/22/12.  Marland Kitchen DEPRESSION   . Erosive gastritis 2006   NSAID precipitated, resolved on f/u EGD  . Hx of adenomatous colonic polyps 02/10/2015  . Hx of colonic polyp 2006   diminutive '06, no  polyps on 4/09 colo - for repeat 4/14  . Hyperlipidemia   . Hypertension   . IBS (irritable bowel syndrome)   . MIGRAINE HEADACHE   . POSTMENOPAUSAL SYNDROME   . PULMONARY NODULE   . PVD    normal ABI/TBI 09/2009, chronic L toe pain and vasospasm (Raynauds)  . RENAL CYST   . Sinus bradycardia    a. not on BB due to this.   Surgical Hx: The patient  has a past surgical history that includes Tonsillectomy; Vaginal hysterectomy; Excision Morton's neuroma (1971); artherectomy (11/22/2012); Abdominal hysterectomy; Tonsillectomy; Esophagogastroduodenoscopy; Colonoscopy; Mitral valve replacement; Laparoscopic right hemi colectomy (06/29/2014); laparotomy (N/A, 06/29/2014); Partial colectomy (N/A, 06/29/2014); left heart catheterization with coronary angiogram (N/A, 11/17/2012); percutaneous coronary stent intervention (pci-s) (N/A, 11/22/2012); and percutaneous coronary rotoblator intervention (pci-r) (11/22/2012).   Current Medications: No outpatient medications have been marked as taking for the 11/09/18 encounter (Appointment) with Michele Burns T, PA-C.     Allergies:   Codeine and Cortisone   Social History   Tobacco Use  . Smoking status: Former Smoker    Years: 30.00    Types: Cigarettes    Last attempt to quit: 10/12/1980    Years since quitting: 38.1  . Smokeless tobacco: Never Used  . Tobacco comment: She is widowed since 21-she has 3 grown children and 5 g-kids. Moved to [place] from [place] in 2010 to be close to family  Substance Use Topics  . Alcohol use: Yes    Alcohol/week: 10.0 standard drinks    Types: 10 Glasses of wine per week    Comment: <2 /  day, white wine twice daily  . Drug use: No     Family Hx: The patient's family history includes Diabetes in her mother and paternal grandmother; Diabetes type II in her mother; Healthy in her daughter and son; Heart attack (age of onset: 28) in her brother; Heart attack (age of onset: 69) in her father; Heart attack (age of onset:  28) in her mother. There is no history of Colon cancer, Colon polyps, Kidney disease, Esophageal cancer, Gallbladder disease, Hypertension, or Stroke.  ROS:   Please see the history of present illness.    ROS All other systems reviewed and are negative.   EKGs/Labs/Other Test Reviewed:    EKG:  EKG is *** ordered today.  The ekg ordered today demonstrates ***  Recent Labs: No results found for requested labs within last 8760 hours.   Recent Lipid Panel Lab Results  Component Value Date/Time   CHOL 180 12/19/2015 11:57 AM   TRIG 52.0 12/19/2015 11:57 AM   TRIG 78 04/17/2009   HDL 98.50 12/19/2015 11:57 AM   CHOLHDL 2 12/19/2015 11:57 AM   LDLCALC 71 12/19/2015 11:57 AM    Physical Exam:    VS:  There were no vitals taken for this visit.    Wt Readings from Last 3 Encounters:  08/29/18 131 lb 1.9 oz (59.5 kg)  04/13/18 133 lb (60.3 kg)  06/07/17 132 lb 4 oz (60 kg)     ***Physical Exam  ASSESSMENT & PLAN:    No diagnosis found.*** Coronary artery disease involving native coronary artery of native heart without angina pectoris History of drug-eluting stent x2 to the RCA in 2014.  Low risk nuclear stress test in 2017.  She is doing well without anginal symptoms.  She is not on beta-blocker as she is bradycardic.  Continue aspirin, statin.  Essential hypertension The patient's blood pressure is controlled on her current regimen.  Continue current therapy.   Hyperlipidemia, unspecified hyperlipidemia type LDL optimal on most recent lab work.  Continue current Rx.   Dispo:  No follow-ups on file.   Medication Adjustments/Labs and Tests Ordered: Current medicines are reviewed at length with the patient today.  Concerns regarding medicines are outlined above.  Tests Ordered: No orders of the defined types were placed in this encounter.  Medication Changes: No orders of the defined types were placed in this encounter.   Signed, Michele Dopp, PA-C  11/09/2018 8:09  AM    [facility] [place], [place], [place]  [address] Phone: [phone]; Fax: [phone]

## 2018-11-10 ENCOUNTER — Encounter: Payer: Self-pay | Admitting: Physician Assistant

## 2018-11-22 ENCOUNTER — Other Ambulatory Visit: Payer: Self-pay | Admitting: Physician Assistant

## 2018-12-02 ENCOUNTER — Ambulatory Visit: Payer: Medicare Other | Admitting: Physician Assistant

## 2018-12-02 NOTE — Progress Notes (Deleted)
Cardiology Office Note:    Date:  12/02/2018   ID:  Michele Burns, DOB [birth year], MRN [id number]  PCP:  [name], MD  Cardiologist:  Sherren Mocha, MD *** Electrophysiologist:  None   Referring MD: [name], MD   No chief complaint on file. ***  History of Present Illness:    Michele Burns is a 83 y.o. female with coronary artery disease s/p rotational atherectomy and overlapping drug eluting stents to the RCA in 2014, hypertension, hyperlipidemia, sinus bradycardia.  She was last seen by Dr. Burt Knack in 08/2018.  ***  Michele Burns ***  Prior CV studies:   The following studies were reviewed today:  Nuclear stress test 12/20/15 Normal exercise stress test with no prior infarct and no ischemia. Normal BP response to stress. Excellent exercise capacity. EF 59 Low Risk  Stress Echo 03/04/15 Normal stress echocardiogram. Excellent exercise capacity. NormalBP response.  Nuclear stress test 11/17/12 Intermediate stress nuclear study.There is a medium size area of moderate reversible ischemia involving the basal inferior, mid-inferior, and apical inferior and basal inferoseptal segments. EKG reveals significant ischemic ST depression in inferolateral leads.  LV Ejection Fraction: 60%.  Cardiac Catheterization 11/22/12 PCI:  Rotational Atherectomy, 3x32 mm Promus DES and 2.75x20 mm Promus DES to RCA  Cardiac Catheterization 11/17/12 LM calcified LAD ostial 30, mid 40, D2 40-50 LCx mid 30-40 RCA mid 64 EF 55-60  Past Medical History:  Diagnosis Date  . 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 Coronary artery disease    a. Cath 11/17/12 - severe Ca++ moderately severe disease in the mid RCA, otherwise nonobstructive disease - s/p PTCA/rotational atherectomy & 2 DES to mid RCA 11/22/12.  Marland Kitchen DEPRESSION   . Erosive gastritis 2006   NSAID precipitated, resolved on f/u EGD  . Hx of adenomatous colonic polyps 02/10/2015  . Hx of colonic polyp 2006   diminutive '06, no  polyps on 4/09 colo - for repeat 4/14  . Hyperlipidemia   . Hypertension   . IBS (irritable bowel syndrome)   . MIGRAINE HEADACHE   . POSTMENOPAUSAL SYNDROME   . PULMONARY NODULE   . PVD    normal ABI/TBI 09/2009, chronic L toe pain and vasospasm (Raynauds)  . RENAL CYST   . Sinus bradycardia    a. not on BB due to this.   Surgical Hx: The patient  has a past surgical history that includes Tonsillectomy; Vaginal hysterectomy; Excision Morton's neuroma (1971); artherectomy (11/22/2012); Abdominal hysterectomy; Tonsillectomy; Esophagogastroduodenoscopy; Colonoscopy; Mitral valve replacement; Laparoscopic right hemi colectomy (06/29/2014); laparotomy (N/A, 06/29/2014); Partial colectomy (N/A, 06/29/2014); left heart catheterization with coronary angiogram (N/A, 11/17/2012); percutaneous coronary stent intervention (pci-s) (N/A, 11/22/2012); and percutaneous coronary rotoblator intervention (pci-r) (11/22/2012).   Current Medications: No outpatient medications have been marked as taking for the 12/02/18 encounter (Appointment) with Richardson Dopp T, PA-C.     Allergies:   Codeine and Cortisone   Social History   Tobacco Use  . Smoking status: Former Smoker    Years: 30.00    Types: Cigarettes    Last attempt to quit: 10/12/1980    Years since quitting: 38.1  . Smokeless tobacco: Never Used  . Tobacco comment: She is widowed since 62-she has 3 grown children and 5 g-kids. Moved to [place] from [place] in 2010 to be close to family  Substance Use Topics  . Alcohol use: Yes    Alcohol/week: 10.0 standard drinks    Types: 10 Glasses of wine per week    Comment: <2 /  day, white wine twice daily  . Drug use: No     Family Hx: The patient's family history includes Diabetes in her mother and paternal grandmother; Diabetes type II in her mother; Healthy in her daughter and son; Heart attack (age of onset: 42) in her brother; Heart attack (age of onset: 74) in her father; Heart attack (age of onset:  39) in her mother. There is no history of Colon cancer, Colon polyps, Kidney disease, Esophageal cancer, Gallbladder disease, Hypertension, or Stroke.  ROS:   Please see the history of present illness.    ROS All other systems reviewed and are negative.   EKGs/Labs/Other Test Reviewed:    EKG:  EKG is *** ordered today.  The ekg ordered today demonstrates ***  Recent Labs: No results found for requested labs within last 8760 hours.   Recent Lipid Panel Lab Results  Component Value Date/Time   CHOL 180 12/19/2015 11:57 AM   TRIG 52.0 12/19/2015 11:57 AM   TRIG 78 04/17/2009   HDL 98.50 12/19/2015 11:57 AM   CHOLHDL 2 12/19/2015 11:57 AM   LDLCALC 71 12/19/2015 11:57 AM    Physical Exam:    VS:  There were no vitals taken for this visit.    Wt Readings from Last 3 Encounters:  08/29/18 131 lb 1.9 oz (59.5 kg)  04/13/18 133 lb (60.3 kg)  06/07/17 132 lb 4 oz (60 kg)     ***Physical Exam  ASSESSMENT & PLAN:    No diagnosis found.*** Coronary artery disease involving native coronary artery of native heart without angina pectoris History of drug-eluting stent x2 to the RCA in 2014.  Low risk nuclear stress test in 2017.  She is doing well without anginal symptoms.  She is not on beta-blocker as she is bradycardic.  Continue aspirin, statin.  Essential hypertension The patient's blood pressure is controlled on her current regimen.  Continue current therapy.   Hyperlipidemia, unspecified hyperlipidemia type LDL optimal on most recent lab work.  Continue current Rx.   Dispo:  No follow-ups on file.   Medication Adjustments/Labs and Tests Ordered: Current medicines are reviewed at length with the patient today.  Concerns regarding medicines are outlined above.  Tests Ordered: No orders of the defined types were placed in this encounter.  Medication Changes: No orders of the defined types were placed in this encounter.   Signed, Richardson Dopp, PA-C  12/02/2018 6:40  AM    [facility] [address], [place], [place]  [address] Phone: [phone]; Fax: [phone]

## 2018-12-12 NOTE — Progress Notes (Deleted)
Cardiology Office Note   Date:  12/12/2018   ID:  Michele Burns, DOB [birth year], MRN [id number]  PCP:  [name], MD  Cardiologist:  Dr. Burt Knack, MD   No chief complaint on file.    History of Present Illness: Michele Burns is a 83 y.o. female who presents for chest pain, seen for Dr. Burt Knack.    Michele Burns has a prior hx of coronary artery disease, hypertension, mixed hyperlipidemia and sinus bradycardia. She underwent a rotational atherectomy and stenting of the RCA in 2014. She was last seen by Dr. Burt Knack on 08/29/2018 and was doing well. At that time, she continued to work at [employer] a few days per week and was doing well. She was having no exertional complaints and no anginal symptoms. She had recently relocated to [place] [facility].    Today she presents to the office with c/o of recent anginal symptoms.      1. CAD, now with chest pain: -Last ischemic workup with stress test in 2017 with no ischemia and excellent exercise capacity  -Initial cath 2014 with rotational arthrectomy and stenting x2 to RCA -Low risk nuclear stress test in 2017  -Continue ASA, statin    2. HTN: -Well controlled, -Continue losartan   3. HLD: -Last LDL, 71 in 12/2015 -Will repeat Lipid panel   4. Mild dementia: -Continue Aricept     Past Medical History:  Diagnosis Date  . 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 Coronary artery disease    a. Cath 11/17/12 - severe Ca++ moderately severe disease in the mid RCA, otherwise nonobstructive disease - s/p PTCA/rotational atherectomy & 2 DES to mid RCA 11/22/12.  Marland Kitchen DEPRESSION   . Erosive gastritis 2006   NSAID precipitated, resolved on f/u EGD  . Hx of adenomatous colonic polyps 02/10/2015  . Hx of colonic polyp 2006   diminutive '06, no polyps on 4/09 colo - for repeat 4/14  . Hyperlipidemia   . Hypertension   . IBS (irritable bowel syndrome)   . MIGRAINE HEADACHE   . POSTMENOPAUSAL SYNDROME   . PULMONARY NODULE   .  PVD    normal ABI/TBI 09/2009, chronic L toe pain and vasospasm (Raynauds)  . RENAL CYST   . Sinus bradycardia    a. not on BB due to this.    Past Surgical History:  Procedure Laterality Date  . ABDOMINAL HYSTERECTOMY    . artherectomy  11/22/2012   RCA  . COLONOSCOPY    . ESOPHAGOGASTRODUODENOSCOPY    . [place]   L 3/4 toe space  . LAPAROSCOPIC RIGHT HEMI COLECTOMY  06/29/2014  . LAPAROTOMY N/A 06/29/2014   Procedure: EXPLORATORY LAPAROTOMY;  Surgeon: Stark Klein, MD;  Location: [facility];  Service: General;  Laterality: N/A;  . LEFT HEART CATHETERIZATION WITH CORONARY ANGIOGRAM N/A 11/17/2012   Procedure: LEFT HEART CATHETERIZATION WITH CORONARY ANGIOGRAM;  Surgeon: Peter M Martinique, MD;  Location[facility] CATH LAB;  Service: Cardiovascular;  Laterality: N/A;  . PARTIAL COLECTOMY N/A 06/29/2014   Procedure: PARTIAL COLECTOMY;  Surgeon: Stark Klein, MD;  Location: [facility];  Service: General;  Laterality: N/A;  . PERCUTANEOUS CORONARY ROTOBLATOR INTERVENTION (PCI-R)  11/22/2012   Procedure: PERCUTANEOUS CORONARY ROTOBLATOR INTERVENTION (PCI-R);  Surgeon: Peter M Martinique, MD;  Location: [facility] CATH LAB;  Service: Cardiovascular;;  . PERCUTANEOUS CORONARY STENT INTERVENTION (PCI-S) N/A 11/22/2012   Procedure: PERCUTANEOUS CORONARY STENT INTERVENTION (PCI-S);  Surgeon: Peter M Martinique, MD;  Location: [facility] CATH LAB;  Service: Cardiovascular;  Laterality: N/A;  . PTCA    . TONSILLECTOMY     As a child  . TONSILLECTOMY    . VAGINAL HYSTERECTOMY       Current Outpatient Medications  Medication Sig Dispense Refill  . aspirin EC 81 MG tablet Take 1 tablet (81 mg total) by mouth daily.    Marland Kitchen donepezil (ARICEPT) 10 MG tablet     . escitalopram (LEXAPRO) 20 MG tablet Take 1 tablet (20 mg total) by mouth daily. 90 tablet 3  . losartan (COZAAR) 50 MG tablet Take 1 tablet (50 mg total) by mouth every morning. 90 tablet 3  . simvastatin (ZOCOR) 40 MG tablet TAKE 1 TABLET BY MOUTH EVERY NIGHT  AT BEDTIME. 90 tablet 3  . traZODone (DESYREL) 150 MG tablet TK 1 T PO QHS FOR INSOMNIA    . zaleplon (SONATA) 10 MG capsule TAKE ONE CAPSULE BY MOUTH AT BEDTIME AS NEEDED FOR SLEEP 30 capsule 0   No current facility-administered medications for this visit.     Allergies:   Codeine and Cortisone    Social History:  The patient  reports that she quit smoking about 38 years ago. Her smoking use included cigarettes. She quit after 30.00 years of use. She has never used smokeless tobacco. She reports current alcohol use of about 10.0 standard drinks of alcohol per week. She reports that she does not use drugs.   Family History:  The patient's ***family history includes Diabetes in her mother and paternal grandmother; Diabetes type II in her mother; Healthy in her daughter and son; Heart attack (age of onset: 52) in her brother; Heart attack (age of onset: 45) in her father; Heart attack (age of onset: 72) in her mother.    ROS:  Please see the history of present illness.   Otherwise, review of systems are positive for {NONE DEFAULTED:18576::"none"}.   All other systems are reviewed and negative.    PHYSICAL EXAM: VS:  There were no vitals taken for this visit. , BMI There is no height or weight on file to calculate BMI. GEN: Well nourished, well developed, in no acute distress HEENT: normal Neck: no JVD, carotid bruits, or masses Cardiac: ***RRR; no murmurs, rubs, or gallops,no edema  Respiratory:  clear to auscultation bilaterally, normal work of breathing GI: soft, nontender, nondistended, + BS MS: no deformity or atrophy Skin: warm and dry, no rash Neuro:  Strength and sensation are intact Psych: euthymic mood, full affect   EKG:  EKG {ACTION; IS/IS VFI:43329518} ordered today. The ekg ordered today demonstrates ***   Recent Labs: No results found for requested labs within last 8760 hours.    Lipid Panel    Component Value Date/Time   CHOL 180 12/19/2015 1157   TRIG 52.0  12/19/2015 1157   TRIG 78 04/17/2009   HDL 98.50 12/19/2015 1157   CHOLHDL 2 12/19/2015 1157   VLDL 10.4 12/19/2015 1157   LDLCALC 71 12/19/2015 1157      Wt Readings from Last 3 Encounters:  08/29/18 131 lb 1.9 oz (59.5 kg)  04/13/18 133 lb (60.3 kg)  06/07/17 132 lb 4 oz (60 kg)      Other studies Reviewed: Additional studies/ records that were reviewed today include:   Nuclear stress test 12/20/15 Normal exercise stress test with no prior infarct and no ischemia. Normal BP response to stress. Excellent exercise capacity. EF 59 Low Risk  Stress Echo 03/04/15 Normal stress echocardiogram. Excellent exercise capacity. NormalBP response.  Nuclear stress test 11/17/12 Intermediate stress nuclear study.There is a medium size area of moderate reversible ischemia involving the basal inferior, mid-inferior, and apical inferior and basal inferoseptal segments. EKG reveals significant ischemic ST depression in inferolateral leads.  LV Ejection Fraction: 60%.  Cardiac Catheterization 11/22/12 PCI:  Rotational Atherectomy, 3x32 mm Promus DES and 2.75x20 mm Promus DES to RCA  Cardiac Catheterization 11/17/12 LM calcified LAD ostial 30, mid 40, D2 40-50 LCx mid 30-40 RCA mid 80 EF 55-60   ASSESSMENT AND PLAN:  1.  ***   Current medicines are reviewed at length with the patient today.  The patient {ACTIONS; HAS/DOES NOT HAVE:19233} concerns regarding medicines.  The following changes have been made:  {PLAN; NO CHANGE:13088:s}  Labs/ tests ordered today include: *** No orders of the defined types were placed in this encounter.    Disposition:   FU with *** in {gen number 3-42:876811} {Days to years:10300}  Signed, Kathyrn Drown, NP  12/12/2018 3:11 PM    [facility] [address], [place], [place]  [address] Phone: [phone]; Fax: [phone]

## 2018-12-13 ENCOUNTER — Ambulatory Visit: Payer: Medicare Other | Admitting: Physician Assistant

## 2018-12-27 DIAGNOSIS — E7849 Other hyperlipidemia: Secondary | ICD-10-CM | POA: Diagnosis not present

## 2018-12-27 DIAGNOSIS — E538 Deficiency of other specified B group vitamins: Secondary | ICD-10-CM | POA: Diagnosis not present

## 2018-12-27 DIAGNOSIS — I10 Essential (primary) hypertension: Secondary | ICD-10-CM | POA: Diagnosis not present

## 2018-12-28 ENCOUNTER — Telehealth: Payer: Self-pay | Admitting: Physician Assistant

## 2018-12-28 NOTE — Telephone Encounter (Signed)
Contacted patient regarding upcoming routine office visit in light of ongoing COVID-19 pandemic.  The patient is not having any new or worsening symptoms or concerns.  She is agreeable to postponing the upcoming appointment until the COVID-19 outbreak is contained.  PLAN:  1. Appt for 12/30/2018 canceled. 2. Please schedule routine (annual) follow up with Dr. Burt Knack or me in 08/2019.   Richardson Dopp, PA-C  12/28/2018 4:28 PM

## 2018-12-29 DIAGNOSIS — H5203 Hypermetropia, bilateral: Secondary | ICD-10-CM | POA: Diagnosis not present

## 2018-12-30 ENCOUNTER — Ambulatory Visit: Payer: Medicare Other | Admitting: Physician Assistant

## 2019-01-03 DIAGNOSIS — E538 Deficiency of other specified B group vitamins: Secondary | ICD-10-CM | POA: Diagnosis not present

## 2019-01-03 DIAGNOSIS — G4709 Other insomnia: Secondary | ICD-10-CM | POA: Diagnosis not present

## 2019-01-03 DIAGNOSIS — I10 Essential (primary) hypertension: Secondary | ICD-10-CM | POA: Diagnosis not present

## 2019-01-03 DIAGNOSIS — Z0000 Encounter for general adult medical examination without abnormal findings: Secondary | ICD-10-CM | POA: Diagnosis not present

## 2019-03-28 ENCOUNTER — Emergency Department (HOSPITAL_COMMUNITY): Payer: Medicare Other

## 2019-03-28 ENCOUNTER — Emergency Department (HOSPITAL_COMMUNITY)
Admission: EM | Admit: 2019-03-28 | Discharge: 2019-03-28 | Disposition: A | Discharge status: Home / Self Care | Payer: Medicare Other | Attending: Emergency Medicine | Admitting: Emergency Medicine

## 2019-03-28 ENCOUNTER — Other Ambulatory Visit: Payer: Self-pay

## 2019-03-28 DIAGNOSIS — R42 Dizziness and giddiness: Secondary | ICD-10-CM | POA: Insufficient documentation

## 2019-03-28 DIAGNOSIS — E860 Dehydration: Secondary | ICD-10-CM | POA: Diagnosis not present

## 2019-03-28 DIAGNOSIS — I10 Essential (primary) hypertension: Secondary | ICD-10-CM | POA: Diagnosis not present

## 2019-03-28 DIAGNOSIS — I2510 Atherosclerotic heart disease of native coronary artery without angina pectoris: Secondary | ICD-10-CM | POA: Insufficient documentation

## 2019-03-28 DIAGNOSIS — Z79899 Other long term (current) drug therapy: Secondary | ICD-10-CM | POA: Insufficient documentation

## 2019-03-28 DIAGNOSIS — R55 Syncope and collapse: Secondary | ICD-10-CM | POA: Diagnosis not present

## 2019-03-28 DIAGNOSIS — Z87891 Personal history of nicotine dependence: Secondary | ICD-10-CM | POA: Insufficient documentation

## 2019-03-28 LAB — COMPREHENSIVE METABOLIC PANEL
ALT: 22 U/L (ref 0–44)
AST: 31 U/L (ref 15–41)
Albumin: 4.2 g/dL (ref 3.5–5.0)
Alkaline Phosphatase: 41 U/L (ref 38–126)
Anion gap: 10 (ref 5–15)
BUN: 22 mg/dL (ref 8–23)
CO2: 24 mmol/L (ref 22–32)
Calcium: 9.4 mg/dL (ref 8.9–10.3)
Chloride: 104 mmol/L (ref 98–111)
Creatinine, Ser: 1.31 mg/dL — ABNORMAL HIGH (ref 0.44–1.00)
GFR calc Af Amer: 44 mL/min — ABNORMAL LOW (ref >60)
GFR calc non Af Amer: 38 mL/min — ABNORMAL LOW (ref >60)
Glucose, Bld: 117 mg/dL — ABNORMAL HIGH (ref 70–99)
Potassium: 4.3 mmol/L (ref 3.5–5.1)
Sodium: 138 mmol/L (ref 135–145)
Total Bilirubin: 0.9 mg/dL (ref 0.3–1.2)
Total Protein: 6.8 g/dL (ref 6.5–8.1)

## 2019-03-28 LAB — CBC WITH DIFFERENTIAL/PLATELET
Abs Immature Granulocytes: 0.02 10*3/uL (ref 0.00–0.07)
Basophils Absolute: 0 10*3/uL (ref 0.0–0.1)
Basophils Relative: 0 %
Eosinophils Absolute: 0.1 10*3/uL (ref 0.0–0.5)
Eosinophils Relative: 2 %
HCT: 38.5 % (ref 36.0–46.0)
Hemoglobin: 12.9 g/dL (ref 12.0–15.0)
Immature Granulocytes: 0 %
Lymphocytes Relative: 7 %
Lymphs Abs: 0.5 10*3/uL — ABNORMAL LOW (ref 0.7–4.0)
MCH: 31.2 pg (ref 26.0–34.0)
MCHC: 33.5 g/dL (ref 30.0–36.0)
MCV: 93.2 fL (ref 80.0–100.0)
Monocytes Absolute: 0.5 10*3/uL (ref 0.1–1.0)
Monocytes Relative: 8 %
Neutro Abs: 5.7 10*3/uL (ref 1.7–7.7)
Neutrophils Relative %: 83 %
Platelets: 170 10*3/uL (ref 150–400)
RBC: 4.13 MIL/uL (ref 3.87–5.11)
RDW: 12.7 % (ref 11.5–15.5)
WBC: 6.8 10*3/uL (ref 4.0–10.5)
nRBC: 0 % (ref 0.0–0.2)

## 2019-03-28 LAB — CBG MONITORING, ED: Glucose-Capillary: 120 mg/dL — ABNORMAL HIGH (ref 70–99)

## 2019-03-28 MED ORDER — SODIUM CHLORIDE 0.9 % IV SOLN
INTRAVENOUS | Status: DC
  Administered 2019-03-28: 13:00:00 via INTRAVENOUS

## 2019-03-28 NOTE — ED Notes (Signed)
Patient verbalized understanding of discharge instructions and denies any further needs or questions at this time. VS stable. Patient ambulatory with steady gait.  Also spoke with patient's son per patient's request and reviewed same. Escorted patient to ED entrance in wheelchair.

## 2019-03-28 NOTE — ED Provider Notes (Signed)
[facility] Provider Note   CSN: [id number] Arrival date & time: 03/28/19  1231    History   Chief Complaint Chief Complaint  Patient presents with  . Near Syncope    HPI Michele Burns is a 83 y.o. female.     HPI Patient presents after an episode of syncope. Patient states that she is now in her usual state of health.  On she has no history of syncope, states that she is generally well. Today she was at work, standing up, when she lost consciousness. No appreciable prodrome, and no pain either before or after the event. She did seemingly strike her head, transiently had a discomfort around the occiput, but this has resolved. No vision changes, vomiting, neck pain, head pain, weakness in any extremity. No recent change in medication, diet, activity.    Past Medical History:  Diagnosis Date  . 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 Coronary artery disease    a. Cath 11/17/12 - severe Ca++ moderately severe disease in the mid RCA, otherwise nonobstructive disease - s/p PTCA/rotational atherectomy & 2 DES to mid RCA 11/22/12.  Marland Kitchen DEPRESSION   . Erosive gastritis 2006   NSAID precipitated, resolved on f/u EGD  . Hx of adenomatous colonic polyps 02/10/2015  . Hx of colonic polyp 2006   diminutive '06, no polyps on 4/09 colo - for repeat 4/14  . Hyperlipidemia   . Hypertension   . IBS (irritable bowel syndrome)   . MIGRAINE HEADACHE   . POSTMENOPAUSAL SYNDROME   . PULMONARY NODULE   . PVD    normal ABI/TBI 09/2009, chronic L toe pain and vasospasm ([place])  . RENAL CYST   . Sinus bradycardia    a. not on BB due to this.    Patient Active Problem List   Diagnosis Date Noted  . Chronic insomnia 01/21/2015  . Low serum vitamin B12 12/06/2014  . Anemia 12/03/2014  . Tremor   . Atherosclerosis of native arteries of the extremities with ulceration(440.23) 01/02/2014  . CAD (coronary artery disease) 11/18/2012  . Depression  12/25/2010  . IRRITABLE BOWEL SYNDROME 10/08/2010  . PULMONARY NODULE 08/14/2010  . PVD 12/27/2009  . MIGRAINE HEADACHE 11/12/2009  . RENAL CYST 11/12/2009  . CARDIAC MURMUR 09/23/2009  . Hyperlipidemia 09/20/2009  . Essential hypertension 09/20/2009    Past Surgical History:  Procedure Laterality Date  . ABDOMINAL HYSTERECTOMY    . artherectomy  11/22/2012   RCA  . COLONOSCOPY    . ESOPHAGOGASTRODUODENOSCOPY    . [place]   L 3/4 toe space  . LAPAROSCOPIC RIGHT HEMI COLECTOMY  06/29/2014  . LAPAROTOMY N/A 06/29/2014   Procedure: EXPLORATORY LAPAROTOMY;  Surgeon: Stark Klein, MD;  Location: [facility];  Service: General;  Laterality: N/A;  . LEFT HEART CATHETERIZATION WITH CORONARY ANGIOGRAM N/A 11/17/2012   Procedure: LEFT HEART CATHETERIZATION WITH CORONARY ANGIOGRAM;  Surgeon: Peter M Martinique, MD;  Location: [facility] CATH LAB;  Service: Cardiovascular;  Laterality: N/A;  . PARTIAL COLECTOMY N/A 06/29/2014   Procedure: PARTIAL COLECTOMY;  Surgeon: Stark Klein, MD;  Location: [facility];  Service: General;  Laterality: N/A;  . PERCUTANEOUS CORONARY ROTOBLATOR INTERVENTION (PCI-R)  11/22/2012   Procedure: PERCUTANEOUS CORONARY ROTOBLATOR INTERVENTION (PCI-R);  Surgeon: Peter M Martinique, MD;  Location: [facility] CATH LAB;  Service: Cardiovascular;;  . PERCUTANEOUS CORONARY STENT INTERVENTION (PCI-S) N/A 11/22/2012   Procedure: PERCUTANEOUS CORONARY STENT INTERVENTION (PCI-S);  Surgeon: Peter M Martinique, MD;  Location: [facility]  CATH LAB;  Service: Cardiovascular;  Laterality: N/A;  . PTCA    . TONSILLECTOMY     As a child  . TONSILLECTOMY    . VAGINAL HYSTERECTOMY       OB History   No obstetric history on file.      Home Medications    Prior to Admission medications   Medication Sig Start Date End Date Taking? Authorizing Provider  acetaminophen (TYLENOL) 325 MG tablet Take 650 mg by mouth every 6 (six) hours as needed for mild pain.   Yes [provider]  Multiple Vitamin  (MULTIVITAMIN WITH MINERALS) TABS tablet Take 1 tablet by mouth daily.   Yes [provider]  aspirin EC 81 MG tablet Take 1 tablet (81 mg total) by mouth daily. Patient not taking: Reported on 03/28/2019 03/29/17   Sherren Mocha, MD  escitalopram (LEXAPRO) 20 MG tablet Take 1 tablet (20 mg total) by mouth daily. Patient not taking: Reported on 03/28/2019 12/19/15   Hoyt Koch, MD  losartan (COZAAR) 50 MG tablet Take 1 tablet (50 mg total) by mouth every morning. Patient not taking: Reported on 03/28/2019 04/13/18   Richardson Dopp T, PA-C  simvastatin (ZOCOR) 40 MG tablet TAKE 1 TABLET BY MOUTH EVERY NIGHT AT BEDTIME. Patient not taking: Reported on 03/28/2019 11/22/18   Richardson Dopp T, PA-C  zaleplon (SONATA) 10 MG capsule TAKE ONE CAPSULE BY MOUTH AT BEDTIME AS NEEDED FOR SLEEP Patient not taking: Reported on 03/28/2019 08/04/16   [place] Circle, FNP    Family History Family History  Problem Relation Age of Onset  . Heart attack Father 6       deceased  . Heart attack Mother 36       deceased  . Diabetes type II Mother   . Diabetes Mother   . Heart attack Brother 87       deceased  . Diabetes Paternal Grandmother        IDDM  . Healthy Son        x 2  . Healthy Daughter   . Colon cancer Neg Hx   . Colon polyps Neg Hx   . Kidney disease Neg Hx   . Esophageal cancer Neg Hx   . Gallbladder disease Neg Hx   . Hypertension Neg Hx   . Stroke Neg Hx     Social History Social History   Tobacco Use  . Smoking status: Former Smoker    Years: 30.00    Types: Cigarettes    Quit date: 10/12/1980    Years since quitting: 38.4  . Smokeless tobacco: Never Used  . Tobacco comment: She is widowed since 56-she has 3 grown children and 5 g-kids. Moved to [place] from [place] in 2010 to be close to family  Substance Use Topics  . Alcohol use: Yes    Alcohol/week: 10.0 standard drinks    Types: 10 Glasses of wine per week    Comment: <2 /day, white wine twice daily  .  Drug use: No     Allergies   Codeine and Cortisone   Review of Systems Review of Systems  Constitutional:       Per HPI, otherwise negative  HENT:       Per HPI, otherwise negative  Respiratory:       Per HPI, otherwise negative  Cardiovascular:       Per HPI, otherwise negative  Gastrointestinal: Negative for vomiting.  Endocrine:       Negative aside  from HPI  Genitourinary:       Neg aside from HPI   Musculoskeletal:       Per HPI, otherwise negative  Skin: Negative.   Neurological: Positive for syncope.     Physical Exam Updated Vital Signs BP 107/64   Pulse (!) 48   Temp 97.6 F (36.4 C) (Oral)   Resp 14   SpO2 100%   Physical Exam Vitals signs and nursing note reviewed.  Constitutional:      General: She is not in acute distress.    Appearance: She is well-developed.  HENT:     Head: Normocephalic and atraumatic.   Eyes:     Conjunctiva/sclera: Conjunctivae normal.  Neck:     Musculoskeletal: Full passive range of motion without pain. No spinous process tenderness or muscular tenderness.  Cardiovascular:     Rate and Rhythm: Normal rate and regular rhythm.  Pulmonary:     Effort: Pulmonary effort is normal. No respiratory distress.     Breath sounds: Normal breath sounds. No stridor.  Abdominal:     General: There is no distension.  Skin:    General: Skin is warm and dry.  Neurological:     Mental Status: She is alert and oriented to person, place, and time.     Cranial Nerves: Cranial nerves are intact. No cranial nerve deficit.     Motor: Motor function is intact.      ED Treatments / Results  Labs (all labs ordered are listed, but only abnormal results are displayed) Labs Reviewed  COMPREHENSIVE METABOLIC PANEL - Abnormal; Notable for the following components:      Result Value   Glucose, Bld 117 (*)    Creatinine, Ser 1.31 (*)    GFR calc non Af Amer 38 (*)    GFR calc Af Amer 44 (*)    All other components within normal limits   CBC WITH DIFFERENTIAL/PLATELET - Abnormal; Notable for the following components:   Lymphs Abs 0.5 (*)    All other components within normal limits  CBG MONITORING, ED - Abnormal; Notable for the following components:   Glucose-Capillary 120 (*)    All other components within normal limits    EKG EKG Interpretation  Date/Time:  Tuesday March 28 2019 12:47:23 EDT Ventricular Rate:  54 PR Interval:    QRS Duration: 107 QT Interval:  470 QTC Calculation: 446 R Axis:   -69 Text Interpretation:  Sinus rhythm Left anterior fascicular block Anterior infarct, old Baseline wander Abnormal ECG Confirmed by Carmin Muskrat [phone]) on 03/28/2019 1:24:26 PM   Radiology Dg Chest Port 1 View  Result Date: 03/28/2019 CLINICAL DATA:  Near syncopal episode. History of sinus bradycardia and pulmonary nodule. Former smoker. EXAM: PORTABLE CHEST 1 VIEW COMPARISON:  Chest CT 08/21/2010.  No recent studies. FINDINGS: 1249 hours. The heart size and mediastinal contours are stable. The lungs appear clear. There is no pleural effusion or pneumothorax. No acute osseous findings are evident. Telemetry leads overlie the chest. IMPRESSION: No evidence of active cardiopulmonary process. Electronically Signed   By: Richardean Sale M.D.   On: 03/28/2019 13:20    Procedures Procedures (including critical care time)  Medications Ordered in ED Medications  0.9 %  sodium chloride infusion ( Intravenous New Bag/Given 03/28/19 1319)     Initial Impression / Assessment and Plan / ED Course  I have reviewed the triage vital signs and the nursing notes.  Pertinent labs & imaging results that were available during my  care of the patient were reviewed by me and considered in my medical decision making (see chart for details).       Elderly female presents after an episode of syncope. Here the patient is awake, alert, in no distress per She is hemodynamically unremarkable and on monitoring has no sustained arrhythmia.  Patient had reassuring labs, EKG, x-ray, no evidence for hypovolemic state, little suspicion for occult pneumonia, no evidence for ACS.  We had a lengthy conversation about possible etiologies for near syncope, and with consideration of dehydration is most likely given her improvement here, absence of other notable findings, patient was discharged in stable condition with close outpatient follow-up return precautions, home care instructions.  Final Clinical Impressions(s) / ED Diagnoses  Near syncope   Carmin Muskrat, MD 03/28/19 1440

## 2019-03-28 NOTE — Discharge Instructions (Signed)
As discussed, your evaluation today has been largely reassuring.  But, it is important that you monitor your condition carefully, and do not hesitate to return to the ED if you develop new, or concerning changes in your condition. ? ?Otherwise, please follow-up with your physician for appropriate ongoing care. ? ?

## 2019-03-28 NOTE — ED Notes (Signed)
Pt.'s son stated she passed out at work and she has never passed out.

## 2019-03-28 NOTE — ED Triage Notes (Signed)
Patient reports she was standing while at work and had a near-syncopal episode - felt lightheaded and fell, but did not pass out all the way. Denies history of same, no N/V/D, no pain. She states she has a bump on her head, but other than that, feels normal. A&O x 4. Ambulatory with steady gait. Currently denies dizziness or lightheadedness.

## 2019-04-26 DIAGNOSIS — G4700 Insomnia, unspecified: Secondary | ICD-10-CM | POA: Diagnosis not present

## 2019-04-26 DIAGNOSIS — I10 Essential (primary) hypertension: Secondary | ICD-10-CM | POA: Diagnosis not present

## 2019-07-17 ENCOUNTER — Other Ambulatory Visit: Payer: Self-pay | Admitting: Physician Assistant

## 2019-07-18 ENCOUNTER — Other Ambulatory Visit: Payer: Self-pay | Admitting: Cardiovascular Disease

## 2019-07-18 ENCOUNTER — Other Ambulatory Visit: Payer: Self-pay

## 2019-07-18 MED ORDER — LOSARTAN POTASSIUM 50 MG PO TABS: 50.0000 mg | ORAL_TABLET | Freq: Every day | ORAL | 0 refills | Status: DC

## 2019-07-21 ENCOUNTER — Other Ambulatory Visit: Payer: Self-pay | Admitting: Cardiovascular Disease

## 2019-07-28 DIAGNOSIS — Z885 Allergy status to narcotic agent status: Secondary | ICD-10-CM | POA: Diagnosis not present

## 2019-07-28 DIAGNOSIS — I2510 Atherosclerotic heart disease of native coronary artery without angina pectoris: Secondary | ICD-10-CM | POA: Diagnosis not present

## 2019-07-28 DIAGNOSIS — R0789 Other chest pain: Secondary | ICD-10-CM | POA: Diagnosis not present

## 2019-07-28 DIAGNOSIS — Z7982 Long term (current) use of aspirin: Secondary | ICD-10-CM | POA: Diagnosis not present

## 2019-07-28 DIAGNOSIS — Z8673 Personal history of transient ischemic attack (TIA), and cerebral infarction without residual deficits: Secondary | ICD-10-CM | POA: Diagnosis not present

## 2019-07-28 DIAGNOSIS — I10 Essential (primary) hypertension: Secondary | ICD-10-CM | POA: Diagnosis not present

## 2019-07-28 DIAGNOSIS — R42 Dizziness and giddiness: Secondary | ICD-10-CM | POA: Diagnosis not present

## 2019-07-28 DIAGNOSIS — R079 Chest pain, unspecified: Secondary | ICD-10-CM | POA: Diagnosis not present

## 2019-07-28 DIAGNOSIS — Z79899 Other long term (current) drug therapy: Secondary | ICD-10-CM | POA: Diagnosis not present

## 2019-07-29 DIAGNOSIS — I10 Essential (primary) hypertension: Secondary | ICD-10-CM | POA: Diagnosis not present

## 2019-07-29 DIAGNOSIS — R079 Chest pain, unspecified: Secondary | ICD-10-CM | POA: Diagnosis not present

## 2019-07-29 DIAGNOSIS — R001 Bradycardia, unspecified: Secondary | ICD-10-CM | POA: Diagnosis not present

## 2019-07-29 DIAGNOSIS — I2510 Atherosclerotic heart disease of native coronary artery without angina pectoris: Secondary | ICD-10-CM | POA: Diagnosis not present

## 2019-08-18 DIAGNOSIS — R634 Abnormal weight loss: Secondary | ICD-10-CM | POA: Diagnosis not present

## 2019-08-18 DIAGNOSIS — R413 Other amnesia: Secondary | ICD-10-CM | POA: Diagnosis not present

## 2019-08-18 DIAGNOSIS — F0390 Unspecified dementia without behavioral disturbance: Secondary | ICD-10-CM | POA: Diagnosis not present

## 2019-08-18 DIAGNOSIS — N390 Urinary tract infection, site not specified: Secondary | ICD-10-CM | POA: Diagnosis not present

## 2019-08-18 DIAGNOSIS — R3121 Asymptomatic microscopic hematuria: Secondary | ICD-10-CM | POA: Diagnosis not present

## 2019-08-18 DIAGNOSIS — E538 Deficiency of other specified B group vitamins: Secondary | ICD-10-CM | POA: Diagnosis not present

## 2019-08-18 DIAGNOSIS — Z7189 Other specified counseling: Secondary | ICD-10-CM | POA: Diagnosis not present

## 2019-09-14 DIAGNOSIS — R634 Abnormal weight loss: Secondary | ICD-10-CM | POA: Diagnosis not present

## 2019-09-14 DIAGNOSIS — F0390 Unspecified dementia without behavioral disturbance: Secondary | ICD-10-CM | POA: Diagnosis not present

## 2019-09-14 DIAGNOSIS — I10 Essential (primary) hypertension: Secondary | ICD-10-CM | POA: Diagnosis not present

## 2019-09-14 DIAGNOSIS — F3340 Major depressive disorder, recurrent, in remission, unspecified: Secondary | ICD-10-CM | POA: Diagnosis not present

## 2019-10-19 DIAGNOSIS — R413 Other amnesia: Secondary | ICD-10-CM | POA: Diagnosis not present

## 2019-10-19 DIAGNOSIS — F0390 Unspecified dementia without behavioral disturbance: Secondary | ICD-10-CM | POA: Diagnosis not present

## 2019-10-19 DIAGNOSIS — R634 Abnormal weight loss: Secondary | ICD-10-CM | POA: Diagnosis not present

## 2019-10-19 DIAGNOSIS — G4700 Insomnia, unspecified: Secondary | ICD-10-CM | POA: Diagnosis not present

## 2019-11-09 ENCOUNTER — Ambulatory Visit: Payer: Medicare Other

## 2019-11-16 ENCOUNTER — Other Ambulatory Visit: Payer: Self-pay

## 2019-11-16 ENCOUNTER — Encounter: Payer: Self-pay | Admitting: Cardiology

## 2019-11-16 ENCOUNTER — Ambulatory Visit: Payer: Medicare Other | Admitting: Cardiology

## 2019-11-16 VITALS — BP 102/62 | HR 55 | Ht 65.5 in | Wt 113.0 lb

## 2019-11-16 DIAGNOSIS — I10 Essential (primary) hypertension: Secondary | ICD-10-CM | POA: Diagnosis not present

## 2019-11-16 DIAGNOSIS — E785 Hyperlipidemia, unspecified: Secondary | ICD-10-CM

## 2019-11-16 DIAGNOSIS — I2510 Atherosclerotic heart disease of native coronary artery without angina pectoris: Secondary | ICD-10-CM | POA: Diagnosis not present

## 2019-11-16 NOTE — Patient Instructions (Addendum)
Medication Instructions:  Your physician recommends that you continue on your current medications as directed. Please refer to the Current Medication list given to you today.  *If you need a refill on your cardiac medications before your next appointment, please call your pharmacy*  Lab Work: None ordered   If you have labs (blood work) drawn today and your tests are completely normal, you will receive your results only by: Marland Kitchen MyChart Message (if you have MyChart) OR . A paper copy in the mail If you have any lab test that is abnormal or we need to change your treatment, we will call you to review the results.  Testing/Procedures: None ordered   Follow-Up: At [facility], you and your health needs are our priority.  As part of our continuing mission to provide you with exceptional heart care, we have created designated Provider Care Teams.  These Care Teams include your primary Cardiologist (physician) and Advanced Practice Providers (APPs -  Physician Assistants and Nurse Practitioners) who all work together to provide you with the care you need, when you need it.  Your next appointment:   1 year(s)  The format for your next appointment:   In Person  Provider:   You may see Sherren Mocha, MD or one of the following Advanced Practice Providers on your designated Care Team:    Richardson Dopp, PA-C  [place], [place]  Daune Perch, NP   Other Instructions  Lifestyle Modifications to Prevent and Treat Heart Disease -Recommend heart healthy/Mediterranean diet, with whole grains, fruits, vegetables, fish, lean meats, nuts, olive oil and avocado oil.  -Limit salt intake to less than 2000 mg per day.  -Recommend moderate walking, starting slowly with a few minutes and working up to 3-5 times/week for 30-50 minutes each session. Aim for at least 150 minutes.week. Goal should be pace of 3 miles/hours, or walking 1.5 miles in 30 minutes -Recommend avoidance of tobacco products. Avoid  excess alcohol. -Keep blood pressure well controlled, ideally less than 130/80.

## 2019-11-16 NOTE — Progress Notes (Signed)
Cardiology Office Note:    Date:  11/16/2019   ID:  Michele Burns, DOB Jan 14, 1936, MRN [id number]  PCP:  [name], MD  Cardiologist:  Sherren Mocha, MD  Referring MD: [name], MD   Chief Complaint  Patient presents with  . Follow-up  . Coronary Artery Disease    History of Present Illness:    Michele Burns is a 84 y.o. female with a past medical history significant for CAD s/p RCA stent 2014, hypertension, mixed hyperlipidemia, sinus bradycardia and TIAs.  She had rotational atherectomy and stenting of the RCA in 2014.  She was last seen in our office on 08/29/2018 by Dr. Burt Knack at which time she was still working part-time and was doing well without any cardiac symptoms.  The patient was seen in the hospital in [place] in 07/2019 for atypical chest pain.  Troponins were negative and myocardial perfusion imaging was negative for ischemia, EF preserved.  Her symptoms resolved.  Michele Burns is here today for annual follow up. She is retired and lives alone in an appartment. She is independent, does some light housework, cooking, Medical sales representative, driving, shopping. She goes to the gym 2-3 days per week. She walks outside in nicer weather. She feels that she is quite active. She denies chest pain/pressure, shortness of breath, orthopnea, PND, edema, palpitations, lightheadedness or syncope.    Cardiac studies   Myocardial perfusion imaging 07/29/2019  Normal rest/stress SPECT myocardial perfusion images. There is no  evidence of significant infarction or ischemia.  There are no wall motion abnormalities. The LV is normal in size. Left  ventricular function is normal. The ejection fraction is 50-70%. Transient  ischemic dilation ratio is considered normal.  Stress/ECG changes are normal. Normal BP response with stress. Normal HR  response with stress.  There is no prior study for comparison.  This study suggests a low risk for cardiovascular event and is    associated with a cardiac mortality of less than 1% per year.  Stress test 12-20-2015: Study Highlights    Nuclear stress EF: 59%.  There was no ST segment deviation noted during stress.  The study is normal.  This is a low risk study.  The left ventricular ejection fraction is normal (55-65%).  Normal exercise stress test with no prior infarct and no ischemia. Normal BP response to stress. Excellent exercise capacity.   Stress echo 03/04/2015 Study Conclusions   - Stress ECG conclusions: There were no stress arrhythmias or  conduction abnormalities. The stress ECG was normal.  - Staged echo: There was no echocardiographic evidence for  stress-induced ischemia.   Impressions:   - Normal stress echocardiogram. Excellent exercise capacity. Normal  BP response.    Past Medical History:  Diagnosis Date  . Cecal volvulus ([place]) 06/29/2014  . Coronary artery disease    a. Cath 11/17/12 - severe Ca++ moderately severe disease in the mid RCA, otherwise nonobstructive disease - s/p PTCA/rotational atherectomy & 2 DES to mid RCA 11/22/12.  Marland Kitchen DEPRESSION   . Erosive gastritis 2006   NSAID precipitated, resolved on f/u EGD  . Hx of adenomatous colonic polyps 02/10/2015  . Hx of colonic polyp 2006   diminutive '06, no polyps on 4/09 colo - for repeat 4/14  . Hyperlipidemia   . Hypertension   . IBS (irritable bowel syndrome)   . MIGRAINE HEADACHE   . POSTMENOPAUSAL SYNDROME   . PULMONARY NODULE   . PVD    normal ABI/TBI 09/2009, chronic L toe pain  and vasospasm (Raynauds)  . RENAL CYST   . Sinus bradycardia    a. not on BB due to this.    Past Surgical History:  Procedure Laterality Date  . ABDOMINAL HYSTERECTOMY    . artherectomy  11/22/2012   RCA  . COLONOSCOPY    . ESOPHAGOGASTRODUODENOSCOPY    . [place]   L 3/4 toe space  . LAPAROSCOPIC RIGHT HEMI COLECTOMY  06/29/2014  . LAPAROTOMY N/A 06/29/2014   Procedure: EXPLORATORY LAPAROTOMY;   Surgeon: Stark Klein, MD;  Location: [facility];  Service: General;  Laterality: N/A;  . LEFT HEART CATHETERIZATION WITH CORONARY ANGIOGRAM N/A 11/17/2012   Procedure: LEFT HEART CATHETERIZATION WITH CORONARY ANGIOGRAM;  Surgeon: Peter M Martinique, MD;  Location: [facility] CATH LAB;  Service: Cardiovascular;  Laterality: N/A;  . PARTIAL COLECTOMY N/A 06/29/2014   Procedure: PARTIAL COLECTOMY;  Surgeon: Stark Klein, MD;  Location: [facility];  Service: General;  Laterality: N/A;  . PERCUTANEOUS CORONARY ROTOBLATOR INTERVENTION (PCI-R)  11/22/2012   Procedure: PERCUTANEOUS CORONARY ROTOBLATOR INTERVENTION (PCI-R);  Surgeon: Peter M Martinique, MD;  Location: [facility] CATH LAB;  Service: Cardiovascular;;  . PERCUTANEOUS CORONARY STENT INTERVENTION (PCI-S) N/A 11/22/2012   Procedure: PERCUTANEOUS CORONARY STENT INTERVENTION (PCI-S);  Surgeon: Peter M Martinique, MD;  Location: [facility] CATH LAB;  Service: Cardiovascular;  Laterality: N/A;  . PTCA    . TONSILLECTOMY     As a child  . TONSILLECTOMY    . VAGINAL HYSTERECTOMY      Current Medications: No outpatient medications have been marked as taking for the 11/16/19 encounter (Office Visit) with Daune Perch, NP.     Allergies:   Codeine and Cortisone   Social History   Socioeconomic History  . Marital status: Widowed    Spouse name: Not on file  . Number of children: 3  . Years of education: Not on file  . Highest education level: Not on file  Occupational History  . Occupation: [employer]    Employer: [employer]  Tobacco Use  . Smoking status: Former Smoker    Years: 30.00    Types: Cigarettes    Quit date: 10/12/1980    Years since quitting: 39.1  . Smokeless tobacco: Never Used  . Tobacco comment: She is widowed since 31-she has 3 grown children and 5 g-kids. Moved to [place] from [place] in 2010 to be close to family  Substance and Sexual Activity  . Alcohol use: Yes    Alcohol/week: 10.0 standard drinks    Types: 10 Glasses of wine per week    Comment:  <2 /day, white wine twice daily  . Drug use: No  . Sexual activity: Not on file  Other Topics Concern  . Not on file  Social History Narrative   Lives alone in a one story home. Husband passed away 15 year ago. Has 3 children.     Works part time in Scientist, research (medical).     Education: 2 years of college.    Social Determinants of Health   Financial Resource Strain:   . Difficulty of Paying Living Expenses: Not on file  Food Insecurity:   . Worried About Charity fundraiser in the Last Year: Not on file  . Ran Out of Food in the Last Year: Not on file  Transportation Needs:   . Lack of Transportation (Medical): Not on file  . Lack of Transportation (Non-Medical): Not on file  Physical Activity:   . Days of Exercise per Week: Not  on file  . Minutes of Exercise per Session: Not on file  Stress:   . Feeling of Stress : Not on file  Social Connections:   . Frequency of Communication with Friends and Family: Not on file  . Frequency of Social Gatherings with Friends and Family: Not on file  . Attends Religious Services: Not on file  . Active Member of Clubs or Organizations: Not on file  . Attends Archivist Meetings: Not on file  . Marital Status: Not on file     Family History: The patient's family history includes Diabetes in her mother and paternal grandmother; Diabetes type II in her mother; Healthy in her daughter and son; Heart attack (age of onset: 65) in her brother; Heart attack (age of onset: 16) in her father; Heart attack (age of onset: 47) in her mother. There is no history of Colon cancer, Colon polyps, Kidney disease, Esophageal cancer, Gallbladder disease, Hypertension, or Stroke. ROS:   Please see the history of present illness.     All other systems reviewed and are negative.   EKG:  EKG is not ordered today.  Recent Labs: 03/28/2019: ALT 22; BUN 22; Creatinine, Ser 1.31; Hemoglobin 12.9; Platelets 170; Potassium 4.3; Sodium 138   Recent Lipid Panel      Component Value Date/Time   CHOL 180 12/19/2015 1157   TRIG 52.0 12/19/2015 1157   TRIG 78 04/17/2009 0000   HDL 98.50 12/19/2015 1157   CHOLHDL 2 12/19/2015 1157   VLDL 10.4 12/19/2015 1157   LDLCALC 71 12/19/2015 1157    Physical Exam:    VS:  BP 102/62   Pulse (!) 55   Ht 5' 5.5" (1.664 m)   Wt 113 lb (51.3 kg)   SpO2 98%   BMI 18.52 kg/m     Wt Readings from Last 6 Encounters:  11/16/19 113 lb (51.3 kg)  08/29/18 131 lb 1.9 oz (59.5 kg)  04/13/18 133 lb (60.3 kg)  06/07/17 132 lb 4 oz (60 kg)  03/29/17 131 lb 6.4 oz (59.6 kg)  03/24/16 127 lb 1.9 oz (57.7 kg)     Physical Exam  Constitutional: She is oriented to person, place, and time. She appears well-developed and well-nourished. No distress.  HENT:  Head: Normocephalic and atraumatic.  Neck: No JVD present.  Cardiovascular: Normal rate, regular rhythm, normal heart sounds and intact distal pulses. Exam reveals no gallop and no friction rub.  No murmur heard. Pulmonary/Chest: Effort normal and breath sounds normal. No respiratory distress. She has no wheezes. She has no rales.  Abdominal: Soft. Bowel sounds are normal.  Musculoskeletal:        General: No edema. Normal range of motion.     Cervical back: Normal range of motion and neck supple.  Neurological: She is alert and oriented to person, place, and time.  Skin: Skin is warm and dry.  Psychiatric: She has a normal mood and affect. Her behavior is normal. Judgment and thought content normal.  Vitals reviewed.    ASSESSMENT:    1. Coronary artery disease involving native coronary artery of native heart without angina pectoris   2. Essential (primary) hypertension   3. Hyperlipidemia, unspecified hyperlipidemia type    PLAN:    In order of problems listed above:  CAD -Remote stent to RCA in 2014. -Patient continues on aspirin and statin.  She is not on beta-blocker due to resting bradycardia. -Patient seen for atypical chest pain in October in  [place], ruled out  for MI and had normal stress test. Felt to be musculoskeletal in nature. No further chest pain.  -She is independent and active without any anginal symptoms.   Hypertension -On losartan 50 mg daily -BP is on the low side but pt says this is normal for her. She is asymptomatic.   Hyperlipidemia -On simvastatin 40 mg daily -Lipid panel done in [place] in 07/2019 showed TC 160, HDL 76, LDL 72, triglycerides 57. -Continue current statin.   Medication Adjustments/Labs and Tests Ordered: Current medicines are reviewed at length with the patient today.  Concerns regarding medicines are outlined above. Labs and tests ordered and medication changes are outlined in the patient instructions below:  Patient Instructions  Lifestyle Modifications to Prevent and Treat Heart Disease -Recommend heart healthy/Mediterranean diet, with whole grains, fruits, vegetables, fish, lean meats, nuts, olive oil and avocado oil.  -Limit salt intake to less than 2000 mg per day.  -Recommend moderate walking, starting slowly with a few minutes and working up to 3-5 times/week for 30-50 minutes each session. Aim for at least 150 minutes.week. Goal should be pace of 3 miles/hours, or walking 1.5 miles in 30 minutes -Recommend avoidance of tobacco products. Avoid excess alcohol. -Keep blood pressure well controlled, ideally less than 130/80.      Signed, Daune Perch, NP  11/16/2019 2:45 PM    [facility]

## 2019-11-17 ENCOUNTER — Ambulatory Visit: Payer: Medicare Other

## 2019-11-30 ENCOUNTER — Ambulatory Visit: Payer: Medicare Other

## 2019-12-03 ENCOUNTER — Ambulatory Visit: Payer: Medicare Other | Attending: Internal Medicine

## 2019-12-03 DIAGNOSIS — Z23 Encounter for immunization: Secondary | ICD-10-CM

## 2019-12-03 NOTE — Progress Notes (Signed)
   Covid-19 Vaccination Clinic  Name:  Michele Burns    MRN: [id number] DOB: Feb 03, 1936  12/03/2019  Michele Burns was observed post Covid-19 immunization for 15 minutes without incidence. She was provided with Vaccine Information Sheet and instruction to access the V-Safe system.   Michele Burns was instructed to call 911 with any severe reactions post vaccine: Marland Kitchen Difficulty breathing  . Swelling of your face and throat  . A fast heartbeat  . A bad rash all over your body  . Dizziness and weakness    Immunizations Administered    Name Date Dose VIS Date Route   Pfizer COVID-19 Vaccine 12/03/2019 11:03 AM 0.3 mL 09/22/2019 Intramuscular   Manufacturer: [place]   Lot: [id number]   [place]: [id number]

## 2019-12-25 ENCOUNTER — Ambulatory Visit: Payer: Medicare Other | Attending: Internal Medicine

## 2019-12-25 DIAGNOSIS — Z23 Encounter for immunization: Secondary | ICD-10-CM

## 2019-12-25 NOTE — Progress Notes (Signed)
   Covid-19 Vaccination Clinic  Name:  Michele Burns    MRN: [id number] DOB: 06-09-1936  12/25/2019  Ms. Ziemke was observed post Covid-19 immunization for 15 minutes without incident. She was provided with Vaccine Information Sheet and instruction to access the V-Safe system.   Ms. Ramseur was instructed to call 911 with any severe reactions post vaccine: Marland Kitchen Difficulty breathing  . Swelling of face and throat  . A fast heartbeat  . A bad rash all over body  . Dizziness and weakness   Immunizations Administered    Name Date Dose VIS Date Route   Pfizer COVID-19 Vaccine 12/25/2019 11:17 AM 0.3 mL 09/22/2019 Intramuscular   Manufacturer: [place]   Lot: [id number]   [place]: [id number]

## 2020-01-11 DIAGNOSIS — E538 Deficiency of other specified B group vitamins: Secondary | ICD-10-CM | POA: Diagnosis not present

## 2020-01-16 DIAGNOSIS — E7849 Other hyperlipidemia: Secondary | ICD-10-CM | POA: Diagnosis not present

## 2020-01-16 DIAGNOSIS — Z0000 Encounter for general adult medical examination without abnormal findings: Secondary | ICD-10-CM | POA: Diagnosis not present

## 2020-01-18 DIAGNOSIS — R82998 Other abnormal findings in urine: Secondary | ICD-10-CM | POA: Diagnosis not present

## 2020-01-18 DIAGNOSIS — Z0000 Encounter for general adult medical examination without abnormal findings: Secondary | ICD-10-CM | POA: Diagnosis not present

## 2020-01-18 DIAGNOSIS — I10 Essential (primary) hypertension: Secondary | ICD-10-CM | POA: Diagnosis not present

## 2020-01-18 DIAGNOSIS — F0390 Unspecified dementia without behavioral disturbance: Secondary | ICD-10-CM | POA: Diagnosis not present

## 2020-01-18 DIAGNOSIS — E785 Hyperlipidemia, unspecified: Secondary | ICD-10-CM | POA: Diagnosis not present

## 2020-05-17 DIAGNOSIS — F419 Anxiety disorder, unspecified: Secondary | ICD-10-CM | POA: Diagnosis not present

## 2020-05-17 DIAGNOSIS — R413 Other amnesia: Secondary | ICD-10-CM | POA: Diagnosis not present

## 2020-05-28 DIAGNOSIS — F0390 Unspecified dementia without behavioral disturbance: Secondary | ICD-10-CM | POA: Diagnosis not present

## 2020-05-28 DIAGNOSIS — R413 Other amnesia: Secondary | ICD-10-CM | POA: Diagnosis not present

## 2020-05-28 DIAGNOSIS — R634 Abnormal weight loss: Secondary | ICD-10-CM | POA: Diagnosis not present

## 2020-05-28 DIAGNOSIS — N390 Urinary tract infection, site not specified: Secondary | ICD-10-CM | POA: Diagnosis not present

## 2020-07-16 DIAGNOSIS — N390 Urinary tract infection, site not specified: Secondary | ICD-10-CM | POA: Diagnosis not present

## 2020-07-16 DIAGNOSIS — F0390 Unspecified dementia without behavioral disturbance: Secondary | ICD-10-CM | POA: Diagnosis not present

## 2020-07-16 DIAGNOSIS — R634 Abnormal weight loss: Secondary | ICD-10-CM | POA: Diagnosis not present

## 2020-07-16 DIAGNOSIS — R413 Other amnesia: Secondary | ICD-10-CM | POA: Diagnosis not present

## 2020-07-25 DIAGNOSIS — N390 Urinary tract infection, site not specified: Secondary | ICD-10-CM | POA: Diagnosis not present

## 2020-07-25 DIAGNOSIS — R300 Dysuria: Secondary | ICD-10-CM | POA: Diagnosis not present

## 2020-08-13 DIAGNOSIS — Z1231 Encounter for screening mammogram for malignant neoplasm of breast: Secondary | ICD-10-CM | POA: Diagnosis not present

## 2020-08-26 ENCOUNTER — Ambulatory Visit: Payer: Medicare Other | Admitting: Nurse Practitioner

## 2020-08-26 ENCOUNTER — Telehealth: Payer: Self-pay | Admitting: Cardiovascular Disease

## 2020-08-26 ENCOUNTER — Other Ambulatory Visit: Payer: Self-pay

## 2020-08-26 ENCOUNTER — Encounter: Payer: Self-pay | Admitting: Nurse Practitioner

## 2020-08-26 VITALS — BP 120/80 | HR 51 | Ht 67.0 in | Wt 117.2 lb

## 2020-08-26 DIAGNOSIS — I2510 Atherosclerotic heart disease of native coronary artery without angina pectoris: Secondary | ICD-10-CM | POA: Diagnosis not present

## 2020-08-26 DIAGNOSIS — E785 Hyperlipidemia, unspecified: Secondary | ICD-10-CM | POA: Diagnosis not present

## 2020-08-26 DIAGNOSIS — R002 Palpitations: Secondary | ICD-10-CM

## 2020-08-26 DIAGNOSIS — I10 Essential (primary) hypertension: Secondary | ICD-10-CM

## 2020-08-26 NOTE — Progress Notes (Signed)
CARDIOLOGY OFFICE NOTE  Date:  08/26/2020    Michele Burns Date of Birth: [birth year] Medical Record #[id number]  PCP:  [name], MD  Cardiologist:  Burt Knack  Chief Complaint  Patient presents with  . Irregular Heart Beat  . Palpitations    Work in visit - seen for Dr. Burt Knack    History of Present Illness: Michele Burns is a 84 y.o. female who presents today for a work in visit. Seen for Dr. Burt Knack.   She has a history of known CAD with prior RCA stent with rotational atherectomy in 2014, HTN, HLD, sinus bradycardia and TIAS.   She last saw Dr. Burt Knack in November of 2019.   She was seen this past February by Gae Bon - had been in the hospital in [place] in October of 2020 with chest pain with negative evaluation and negative Myoview. She was doing well at her last visit. She is to see Vin in January of 2022.   Phone call earlier today - "Pts son called to report that the pt has been having "heart Racing" for about 3 weeks... he is not sure if she is having any symptoms associated with it but she has been having it on and off and having it this morning. No changes in her meds or diet... I made her an appt this afternoon with Truitt Merle NP at 3:45pm"  Thus added to my schedule for today.   Comes in today. Here with her son Michele Burns. He augments the entire history. She has had more memory issues - she is on additional medicines for these. This includes Aricept. She has called him 6 times in the past 3 weeks about her rhythm and "not feeling well". She does not really mention this  But then admits she has had some flutters. They had called last week and were given an appointment for January. He called back today since she has called him twice since that phone call to [place]. He does not know what to do. She says she has no pain. She is not dizzy. She has not passed out. She likes coffee, Coke and wine - drinking daily. No falls. No recent labs noted.   Past Medical History:    Diagnosis Date  . 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 Coronary artery disease    a. Cath 11/17/12 - severe Ca++ moderately severe disease in the mid RCA, otherwise nonobstructive disease - s/p PTCA/rotational atherectomy & 2 DES to mid RCA 11/22/12.  Marland Kitchen DEPRESSION   . Erosive gastritis 2006   NSAID precipitated, resolved on f/u EGD  . Hx of adenomatous colonic polyps 02/10/2015  . Hx of colonic polyp 2006   diminutive '06, no polyps on 4/09 colo - for repeat 4/14  . Hyperlipidemia   . Hypertension   . IBS (irritable bowel syndrome)   . MIGRAINE HEADACHE   . POSTMENOPAUSAL SYNDROME   . PULMONARY NODULE   . PVD    normal ABI/TBI 09/2009, chronic L toe pain and vasospasm (Raynauds)  . RENAL CYST   . Sinus bradycardia    a. not on BB due to this.    Past Surgical History:  Procedure Laterality Date  . ABDOMINAL HYSTERECTOMY    . artherectomy  11/22/2012   RCA  . COLONOSCOPY    . ESOPHAGOGASTRODUODENOSCOPY    . [place]   L 3/4 toe space  . LAPAROSCOPIC RIGHT HEMI COLECTOMY  06/29/2014  . LAPAROTOMY N/A 06/29/2014  Procedure: EXPLORATORY LAPAROTOMY;  Surgeon: Stark Klein, MD;  Location: [facility];  Service: General;  Laterality: N/A;  . LEFT HEART CATHETERIZATION WITH CORONARY ANGIOGRAM N/A 11/17/2012   Procedure: LEFT HEART CATHETERIZATION WITH CORONARY ANGIOGRAM;  Surgeon: Peter M Martinique, MD;  Location: [facility] CATH LAB;  Service: Cardiovascular;  Laterality: N/A;  . PARTIAL COLECTOMY N/A 06/29/2014   Procedure: PARTIAL COLECTOMY;  Surgeon: Stark Klein, MD;  Location: [facility];  Service: General;  Laterality: N/A;  . PERCUTANEOUS CORONARY ROTOBLATOR INTERVENTION (PCI-R)  11/22/2012   Procedure: PERCUTANEOUS CORONARY ROTOBLATOR INTERVENTION (PCI-R);  Surgeon: Peter M Martinique, MD;  Location: [facility] CATH LAB;  Service: Cardiovascular;;  . PERCUTANEOUS CORONARY STENT INTERVENTION (PCI-S) N/A 11/22/2012   Procedure: PERCUTANEOUS CORONARY STENT INTERVENTION (PCI-S);  Surgeon: Peter M  Martinique, MD;  Location: [facility] CATH LAB;  Service: Cardiovascular;  Laterality: N/A;  . PTCA    . TONSILLECTOMY     As a child  . TONSILLECTOMY    . VAGINAL HYSTERECTOMY       Medications: Current Meds  Medication Sig  . donepezil (ARICEPT) 10 MG tablet Take 10 mg by mouth at bedtime.  . memantine (NAMENDA) 5 MG tablet Take 5 mg by mouth at bedtime.  . simvastatin (ZOCOR) 40 MG tablet TAKE 1 TABLET BY MOUTH EVERY NIGHT AT BEDTIME.  . traZODone (DESYREL) 100 MG tablet Take 100 mg by mouth at bedtime.     Allergies: Allergies  Allergen Reactions  . Codeine Nausea Only  . Cortisone Nausea Only    Social History: The patient  reports that she quit smoking about 39 years ago. Her smoking use included cigarettes. She quit after 30.00 years of use. She has never used smokeless tobacco. She reports current alcohol use of about 10.0 standard drinks of alcohol per week. She reports that she does not use drugs.   Family History: The patient's family history includes Diabetes in her mother and paternal grandmother; Diabetes type II in her mother; Healthy in her daughter and son; Heart attack (age of onset: 71) in her brother; Heart attack (age of onset: 40) in her father; Heart attack (age of onset: 59) in her mother.   Review of Systems: Please see the history of present illness.   All other systems are reviewed and negative.   Physical Exam: VS:  BP 120/80   Pulse (!) 51   Ht 5\' 7"  (1.702 m)   Wt 117 lb 3.2 oz (53.2 kg)   SpO2 99%   BMI 18.36 kg/m  .  BMI Body mass index is 18.36 kg/m.  Wt Readings from Last 3 Encounters:  08/26/20 117 lb 3.2 oz (53.2 kg)  11/16/19 113 lb (51.3 kg)  08/29/18 131 lb 1.9 oz (59.5 kg)    General: Pleasant. Elderly. Alert and in no acute distress.   Cardiac: Regular rate and rhythm. No murmurs, rubs, or gallops. No edema.  Respiratory:  Lungs are clear to auscultation bilaterally with normal work of breathing.  GI: Soft and nontender.  MS: No  deformity or atrophy. Gait and ROM intact.  Skin: Warm and dry. Color is normal.  Neuro:  Strength and sensation are intact and no gross focal deficits noted.  Psych: Alert, appropriate and with normal affect.   LABORATORY DATA:  EKG:  EKG is ordered today.  Personally reviewed by me. This demonstrates sinus bradycardia - HR is 51 today.  Lab Results  Component Value Date   WBC 6.8 03/28/2019   HGB 12.9 03/28/2019   HCT  38.5 03/28/2019   PLT 170 03/28/2019   GLUCOSE 117 (H) 03/28/2019   CHOL 180 12/19/2015   TRIG 52.0 12/19/2015   HDL 98.50 12/19/2015   LDLCALC 71 12/19/2015   ALT 22 03/28/2019   AST 31 03/28/2019   NA 138 03/28/2019   K 4.3 03/28/2019   CL 104 03/28/2019   CREATININE 1.31 (H) 03/28/2019   BUN 22 03/28/2019   CO2 24 03/28/2019   TSH 2.53 01/02/2016   INR 0.95 11/17/2012       BNP (last 3 results) No results for input(s): BNP in the last 8760 hours.  ProBNP (last 3 results) No results for input(s): PROBNP in the last 8760 hours.   Other Studies Reviewed Today:  Myocardial perfusion imaging 07/29/2019  Normal rest/stress SPECT myocardial perfusion images. There is no  evidence of significant infarction or ischemia.  There are no wall motion abnormalities. The LV is normal in size. Left  ventricular function is normal. The ejection fraction is 50-70%. Transient  ischemic dilation ratio is considered normal.  Stress/ECG changes are normal. Normal BP response with stress. Normal HR  response with stress.  There is no prior study for comparison.  This study suggests a low risk for cardiovascular event and is  associated with a cardiac mortality of less than 1% per year.  Stress test 12-20-2015: Study Highlights    Nuclear stress EF: 59%.  There was no ST segment deviation noted during stress.  The study is normal.  This is a low risk study.  The left ventricular ejection fraction is normal (55-65%).  Normal exercise stress test  with no prior infarct and no ischemia. Normal BP response to stress. Excellent exercise capacity.   Stress echo 03/04/2015 Study Conclusions   - Stress ECG conclusions: There were no stress arrhythmias or  conduction abnormalities. The stress ECG was normal.  - Staged echo: There was no echocardiographic evidence for  stress-induced ischemia.   Impressions:   - Normal stress echocardiogram. Excellent exercise capacity. Normal  BP response.      ASSESSMENT & PLAN:    1. Palpitations - unclear etiology - probably getting too much caffeine/alcohol - agreeable to 14 day Zio. Baseline lab today. Further disposition to follow.   2. CAD with prior RCA stent and rotational atherectomy from 2014 - she has no chest pain.   3. HTN - BP is fine.   4. HLD - she is on statin.   5. Prior TIAs   6. Resting bradycardia - not on beta blocker. She is on Aricept. This may need to be stopped. Will see what her monitor shows. I have asked her to cut back her caffeine/alcohol. Need to rule out AF.   7. Progressive dementia - no longer driving. Steadily needing more care/oversight.    Current medicines are reviewed with the patient today.  The patient does not have concerns regarding medicines other than what has been noted above.  The following changes have been made:  See above.  Labs/ tests ordered today include:    Orders Placed This Encounter  Procedures  . Basic metabolic panel  . CBC  . TSH  . LONG TERM MONITOR (3-14 DAYS)  . EKG 12-Lead     Disposition:   Further disposition to follow. Will check labs - send 14 day Zio.   Patient is agreeable to this plan and will call if any problems develop in the interim.   SignedTruitt Merle, NP  08/26/2020 4:32 PM  [facility] [address] [place], [place]  [address] Phone: [phone] Fax: [phone]

## 2020-08-26 NOTE — Telephone Encounter (Signed)
Patient c/o Palpitations:  High priority if patient c/o lightheadedness, shortness of breath, or chest pain  1) How long have you had palpitations/irregular HR/ Afib? Are you having the symptoms now? 3 weeks, was occurring this morning not sure if she is still having now. States he will call pt back after to check.   2) Are you currently experiencing lightheadedness, SOB or CP? No   3) Do you have a history of afib (atrial fibrillation) or irregular heart rhythm? Doesn't know   4) Have you checked your BP or HR? (document readings if available): No   5) Are you experiencing any other symptoms? No   Pt was not otp in order to transfer call to triage.

## 2020-08-26 NOTE — Patient Instructions (Addendum)
After Visit Summary:  We will be checking the following labs today - BMET, CBC and TSH   Medication Instructions:    Continue with your current medicines for now.    If you need a refill on your cardiac medications before your next appointment, please call your pharmacy.     Testing/Procedures To Be Arranged:  14 day Zio  Follow-Up:   Let's see what your tests show.     At [facility], you and your health needs are our priority.  As part of our continuing mission to provide you with exceptional heart care, we have created designated Provider Care Teams.  These Care Teams include your primary Cardiologist (physician) and Advanced Practice Providers (APPs -  Physician Assistants and Nurse Practitioners) who all work together to provide you with the care you need, when you need it.  Special Instructions:  . Stay safe, wash your hands for at least 20 seconds and wear a mask when needed.  . It was good to talk with you today.    Call the [place] office at [phone] if you have any questions, problems or concerns.

## 2020-08-26 NOTE — Telephone Encounter (Signed)
Pts son called to report that the pt has been having "heart Racing" for about 3 weeks... he is not sure if she is having any symptoms associated with it but she has been having it on and off and having it this morning. No changes in her meds or diet... I made her an appt this afternoon with Truitt Merle NP at 3:45pm.

## 2020-08-27 ENCOUNTER — Telehealth: Payer: Self-pay | Admitting: Nurse Practitioner

## 2020-08-27 ENCOUNTER — Telehealth: Payer: Self-pay | Admitting: Radiology

## 2020-08-27 LAB — TSH: TSH: 2.76 u[IU]/mL (ref 0.450–4.500)

## 2020-08-27 LAB — CBC
Hematocrit: 39.5 % (ref 34.0–46.6)
Hemoglobin: 14 g/dL (ref 11.1–15.9)
MCH: 33.5 pg — ABNORMAL HIGH (ref 26.6–33.0)
MCHC: 35.4 g/dL (ref 31.5–35.7)
MCV: 95 fL (ref 79–97)
RBC: 4.18 x10E6/uL (ref 3.77–5.28)
RDW: 11.9 % (ref 11.7–15.4)
WBC: 5.2 10*3/uL (ref 3.4–10.8)

## 2020-08-27 LAB — BASIC METABOLIC PANEL
BUN/Creatinine Ratio: 14 (ref 12–28)
BUN: 14 mg/dL (ref 8–27)
CO2: 25 mmol/L (ref 20–29)
Calcium: 9.6 mg/dL (ref 8.7–10.3)
Chloride: 101 mmol/L (ref 96–106)
Creatinine, Ser: 1.01 mg/dL — ABNORMAL HIGH (ref 0.57–1.00)
GFR calc Af Amer: 60 mL/min/{1.73_m2} (ref >59)
GFR calc non Af Amer: 52 mL/min/{1.73_m2} — ABNORMAL LOW (ref >59)
Glucose: 75 mg/dL (ref 65–99)
Potassium: 4.5 mmol/L (ref 3.5–5.2)
Sodium: 142 mmol/L (ref 134–144)

## 2020-08-27 NOTE — Telephone Encounter (Signed)
Follow Up:      Son is returning Pat's call from today, concerning pt's lab results.

## 2020-08-27 NOTE — Telephone Encounter (Signed)
Enrolled patient for a 14 day Zio XT  monitor to be mailed to patients home  °

## 2020-08-27 NOTE — Telephone Encounter (Signed)
Michele Junes, NP  08/27/2020 6:55 AM EST     Ok to report. May need to speak with her son - Labs are stable - lets see what the monitor shows - for now, would continue on current regimen as outlined at her visit.     The patients Son Michele Burns (on [place])  has been notified of the pts lab result and verbalized understanding.  All questions (if any) were answered. Nuala Alpha, LPN 68/05/8109 3:15 PM

## 2020-10-24 ENCOUNTER — Ambulatory Visit (INDEPENDENT_AMBULATORY_CARE_PROVIDER_SITE_OTHER): Payer: Medicare Other | Admitting: Physician Assistant

## 2020-10-24 ENCOUNTER — Other Ambulatory Visit: Payer: Self-pay

## 2020-10-24 ENCOUNTER — Encounter: Payer: Self-pay | Admitting: Physician Assistant

## 2020-10-24 VITALS — BP 128/88 | HR 66 | Ht 67.0 in | Wt 117.2 lb

## 2020-10-24 DIAGNOSIS — E785 Hyperlipidemia, unspecified: Secondary | ICD-10-CM | POA: Diagnosis not present

## 2020-10-24 DIAGNOSIS — I2510 Atherosclerotic heart disease of native coronary artery without angina pectoris: Secondary | ICD-10-CM

## 2020-10-24 DIAGNOSIS — R002 Palpitations: Secondary | ICD-10-CM | POA: Diagnosis not present

## 2020-10-24 MED ORDER — ASPIRIN EC 81 MG PO TBEC: 81.0000 mg | DELAYED_RELEASE_TABLET | Freq: Every day | ORAL | 3 refills | Status: DC

## 2020-10-24 NOTE — Patient Instructions (Addendum)
Medication Instructions:  Your physician has recommended you make the following change in your medication:  1.  START Aspirin 81 mg taking 1 daily   *If you need a refill on your cardiac medications before your next appointment, please call your pharmacy*   Lab Work: None ordered  If you have labs (blood work) drawn today and your tests are completely normal, you will receive your results only by: Marland Kitchen MyChart Message (if you have MyChart) OR . A paper copy in the mail If you have any lab test that is abnormal or we need to change your treatment, we will call you to review the results.   Testing/Procedures: None ordered   Follow-Up: At [facility] you and your health needs are our priority.  As part of our continuing mission to provide you with exceptional heart care, we have created designated Provider Care Teams.  These Care Teams include your primary Cardiologist (physician) and Advanced Practice Providers (APPs -  Physician Assistants and Nurse Practitioners) who all work together to provide you with the care you need, when you need it.  We recommend signing up for the patient portal called "MyChart".  Sign up information is provided on this After Visit Summary.  MyChart is used to connect with patients for Virtual Visits (Telemedicine).  Patients are able to view lab/test results, encounter notes, upcoming appointments, etc.  Non-urgent messages can be sent to your provider as well.   To learn more about what you can do with MyChart, go to [email or web address].    Your next appointment:   6 months   The format for your next appointment:   In Person  Provider:   You may see Sherren Mocha, MD or one of the following Advanced Practice Providers on your designated Care Team:    Richardson Dopp, PA-C  Robbie Lis, [place]    Other Instructions

## 2020-10-24 NOTE — Progress Notes (Signed)
Cardiology Office Note:    Date:  10/24/2020   ID:  Michele Burns, DOB 1936-05-09, MRN [id number]  PCP:  [name], MD  [facility] Cardiologist:  Sherren Mocha, MD  [facility] Electrophysiologist:  None   Chief Complaint: 2 months follow up   History of Present Illness:    Michele Burns is a 85 y.o. female with a hx of CAD s/p RCA stent and rotational atherectomy in 2014,HTN, HLD, sinus bradycardia, dementia and TIAs seen for follow up.   Normal exercise Myoview 12/2015.  Seen by APP 08/26/20 for heart racing. Felt getting too much caffeine/alcohol. Ordered Zio monitor but not completed.   Here today for follow up with son.  Patient and his son was in argument of how many days patient wore monitor, when was last symptoms and multiple other issue.  Since patient was monitor on and off for 14 days, however instead feeling back patient threw it away.  Since last episode of palpitation was 1 to 2 weeks ago.  Patient reports palpitation lasting for 1 to 2 minutes.  Unable to figure out how often it was occurring.  She does not have associated chest pain, shortness of breath, dizziness or syncope.  Patient drinks multiple cups of coffee, 2 soda and 3 glasses of wine each day.  Past Medical History:  Diagnosis Date  . 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 Coronary artery disease    a. Cath 11/17/12 - severe Ca++ moderately severe disease in the mid RCA, otherwise nonobstructive disease - s/p PTCA/rotational atherectomy & 2 DES to mid RCA 11/22/12.  Marland Kitchen DEPRESSION   . Erosive gastritis 2006   NSAID precipitated, resolved on f/u EGD  . Hx of adenomatous colonic polyps 02/10/2015  . Hx of colonic polyp 2006   diminutive '06, no polyps on 4/09 colo - for repeat 4/14  . Hyperlipidemia   . Hypertension   . IBS (irritable bowel syndrome)   . MIGRAINE HEADACHE   . POSTMENOPAUSAL SYNDROME   . PULMONARY NODULE   . PVD    normal ABI/TBI 09/2009, chronic L toe pain and vasospasm  ([place])  . RENAL CYST   . Sinus bradycardia    a. not on BB due to this.    Past Surgical History:  Procedure Laterality Date  . ABDOMINAL HYSTERECTOMY    . artherectomy  11/22/2012   RCA  . COLONOSCOPY    . ESOPHAGOGASTRODUODENOSCOPY    . [place]   L 3/4 toe space  . LAPAROSCOPIC RIGHT HEMI COLECTOMY  06/29/2014  . LAPAROTOMY N/A 06/29/2014   Procedure: EXPLORATORY LAPAROTOMY;  Surgeon: Stark Klein, MD;  Location: [place];  Service: General;  Laterality: N/A;  . LEFT HEART CATHETERIZATION WITH CORONARY ANGIOGRAM N/A 11/17/2012   Procedure: LEFT HEART CATHETERIZATION WITH CORONARY ANGIOGRAM;  Surgeon: Peter M Martinique, MD;  Location: [facility] CATH LAB;  Service: Cardiovascular;  Laterality: N/A;  . PARTIAL COLECTOMY N/A 06/29/2014   Procedure: PARTIAL COLECTOMY;  Surgeon: Stark Klein, MD;  Location: [place];  Service: General;  Laterality: N/A;  . PERCUTANEOUS CORONARY ROTOBLATOR INTERVENTION (PCI-R)  11/22/2012   Procedure: PERCUTANEOUS CORONARY ROTOBLATOR INTERVENTION (PCI-R);  Surgeon: Peter M Martinique, MD;  Location: [facility] CATH LAB;  Service: Cardiovascular;;  . PERCUTANEOUS CORONARY STENT INTERVENTION (PCI-S) N/A 11/22/2012   Procedure: PERCUTANEOUS CORONARY STENT INTERVENTION (PCI-S);  Surgeon: Peter M Martinique, MD;  Location: [facility] CATH LAB;  Service: Cardiovascular;  Laterality: N/A;  . PTCA    . TONSILLECTOMY  As a child  . TONSILLECTOMY    . VAGINAL HYSTERECTOMY      Current Medications: Current Meds  Medication Sig  . aspirin EC 81 MG tablet Take 1 tablet (81 mg total) by mouth daily. Swallow whole.  . donepezil (ARICEPT) 10 MG tablet Take 10 mg by mouth at bedtime.  . memantine (NAMENDA) 5 MG tablet Take 5 mg by mouth at bedtime.  . simvastatin (ZOCOR) 40 MG tablet TAKE 1 TABLET BY MOUTH EVERY NIGHT AT BEDTIME.  . traZODone (DESYREL) 100 MG tablet Take 100 mg by mouth at bedtime.     Allergies:   Codeine and Cortisone   Social History   Socioeconomic  History  . Marital status: Widowed    Spouse name: Not on file  . Number of children: 3  . Years of education: Not on file  . Highest education level: Not on file  Occupational History  . Occupation: [employer]    Employer: [employer]  Tobacco Use  . Smoking status: Former Smoker    Years: 30.00    Types: Cigarettes    Quit date: 10/12/1980    Years since quitting: 40.0  . Smokeless tobacco: Never Used  . Tobacco comment: She is widowed since 75-she has 3 grown children and 5 g-kids. Moved to [place] from [place] in 2010 to be close to family  Vaping Use  . Vaping Use: Never used  Substance and Sexual Activity  . Alcohol use: Yes    Alcohol/week: 10.0 standard drinks    Types: 10 Glasses of wine per week    Comment: <2 /day, white wine twice daily  . Drug use: No  . Sexual activity: Not on file  Other Topics Concern  . Not on file  Social History Narrative   Lives alone in a one story home. Husband passed away 15 year ago. Has 3 children.     Works part time in [employer] (medical).     Education: 2 years of college.    Social Determinants of Health   Financial Resource Strain: Not on file  Food Insecurity: Not on file  Transportation Needs: Not on file  Physical Activity: Not on file  Stress: Not on file  Social Connections: Not on file     Family History: The patient's family history includes Diabetes in her mother and paternal grandmother; Diabetes type II in her mother; Healthy in her daughter and son; Heart attack (age of onset: 32) in her brother; Heart attack (age of onset: 2) in her father; Heart attack (age of onset: 5) in her mother. There is no history of Colon cancer, Colon polyps, Kidney disease, Esophageal cancer, Gallbladder disease, Hypertension, or Stroke.    ROS:   Please see the history of present illness.    All other systems reviewed and are negative.   EKGs/Labs/Other Studies Reviewed:    The following studies were reviewed today:  Stress test  12/2015  Nuclear stress EF: 59%.  There was no ST segment deviation noted during stress.  The study is normal.  This is a low risk study.  The left ventricular ejection fraction is normal (55-65%).   Normal exercise stress test with no prior infarct and no ischemia. Normal BP response to stress. Excellent exercise capacity.   EKG:  EKG is not  ordered today.   Recent Labs: 08/26/2020: BUN 14; Creatinine, Ser 1.01; Hemoglobin 14.0; Platelets CANCELED; Potassium 4.5; Sodium 142; TSH 2.760  Recent Lipid Panel    Component Value Date/Time  CHOL 180 12/19/2015 1157   TRIG 52.0 12/19/2015 1157   TRIG 78 04/17/2009 0000   HDL 98.50 12/19/2015 1157   CHOLHDL 2 12/19/2015 1157   VLDL 10.4 12/19/2015 1157   LDLCALC 71 12/19/2015 1157    Physical Exam:    VS:  BP 128/88   Pulse 66   Ht 5\' 7"  (1.702 m)   Wt 117 lb 3.2 oz (53.2 kg)   SpO2 96%   BMI 18.36 kg/m     Wt Readings from Last 3 Encounters:  10/24/20 117 lb 3.2 oz (53.2 kg)  08/26/20 117 lb 3.2 oz (53.2 kg)  11/16/19 113 lb (51.3 kg)     GEN:  Well nourished, well developed in no acute distress HEENT: Normal NECK: No JVD; No carotid bruits LYMPHATICS: No lymphadenopathy CARDIAC: RRR, no murmurs, rubs, gallops RESPIRATORY:  Clear to auscultation without rales, wheezing or rhonchi  ABDOMEN: Soft, non-tender, non-distended MUSCULOSKELETAL:  No edema; No deformity  SKIN: Warm and dry NEUROLOGIC:  Alert and oriented x 3 PSYCHIATRIC: Argumentative  ASSESSMENT AND PLAN:    1. CAD No angina.  Continue statin.  We will add aspirin 81 mg daily.  2.  HLD -Continue statin.  3. Palpitations  Unfortunately patient threw away her monitor after wearing on and off for 14 days per son.  Patient calls her son frequently with panic attacks/tachycardia.  I have advised to cut back on caffeine intake at least 50%  percent.  I think dementia leading to noncompliance.  Discussed repeat monitor versus Apple Watch. Palpitation  only last for 1-2 minutes, not long enough to call EMS.  Patient does not want to try anything right now.  She will give [place] a call back if recurrent symptoms.  Consider live monitor at that time.  Suspect noncompliance.   Medication Adjustments/Labs and Tests Ordered: Current medicines are reviewed at length with the patient today.  Concerns regarding medicines are outlined above.  No orders of the defined types were placed in this encounter.  Meds ordered this encounter  Medications  . aspirin EC 81 MG tablet    Sig: Take 1 tablet (81 mg total) by mouth daily. Swallow whole.    Dispense:  90 tablet    Refill:  3    Patient Instructions  Medication Instructions:  Your physician has recommended you make the following change in your medication:  1.  START Aspirin 81 mg taking 1 daily   *If you need a refill on your cardiac medications before your next appointment, please call your pharmacy*   Lab Work: None ordered  If you have labs (blood work) drawn today and your tests are completely normal, you will receive your results only by: Marland Kitchen MyChart Message (if you have MyChart) OR . A paper copy in the mail If you have any lab test that is abnormal or we need to change your treatment, we will call you to review the results.   Testing/Procedures: None ordered   Follow-Up: At [facility], you and your health needs are our priority.  As part of our continuing mission to provide you with exceptional heart care, we have created designated Provider Care Teams.  These Care Teams include your primary Cardiologist (physician) and Advanced Practice Providers (APPs -  Physician Assistants and Nurse Practitioners) who all work together to provide you with the care you need, when you need it.  We recommend signing up for the patient portal called "MyChart".  Sign up information is provided on this  After Visit Summary.  MyChart is used to connect with patients for Virtual Visits (Telemedicine).   Patients are able to view lab/test results, encounter notes, upcoming appointments, etc.  Non-urgent messages can be sent to your provider as well.   To learn more about what you can do with MyChart, go to [email or web address].    Your next appointment:   6 months   The format for your next appointment:   In Person  Provider:   You may see Sherren Mocha, MD or one of the following Advanced Practice Providers on your designated Care Team:    Richardson Dopp, PA-C  Robbie Lis, PA-C    Other Instructions      Signed, Leanor Kail, [place]  10/24/2020 12:16 PM    [place]

## 2021-01-28 DIAGNOSIS — E785 Hyperlipidemia, unspecified: Secondary | ICD-10-CM | POA: Diagnosis not present

## 2021-01-28 DIAGNOSIS — E538 Deficiency of other specified B group vitamins: Secondary | ICD-10-CM | POA: Diagnosis not present

## 2021-02-04 DIAGNOSIS — N390 Urinary tract infection, site not specified: Secondary | ICD-10-CM | POA: Diagnosis not present

## 2021-04-07 DIAGNOSIS — F0390 Unspecified dementia without behavioral disturbance: Secondary | ICD-10-CM | POA: Diagnosis not present

## 2021-04-07 DIAGNOSIS — R634 Abnormal weight loss: Secondary | ICD-10-CM | POA: Diagnosis not present

## 2021-04-07 DIAGNOSIS — F419 Anxiety disorder, unspecified: Secondary | ICD-10-CM | POA: Diagnosis not present

## 2021-04-07 DIAGNOSIS — R413 Other amnesia: Secondary | ICD-10-CM | POA: Diagnosis not present

## 2022-02-03 DIAGNOSIS — R7989 Other specified abnormal findings of blood chemistry: Secondary | ICD-10-CM | POA: Diagnosis not present

## 2022-02-03 DIAGNOSIS — I10 Essential (primary) hypertension: Secondary | ICD-10-CM | POA: Diagnosis not present

## 2022-02-03 DIAGNOSIS — E538 Deficiency of other specified B group vitamins: Secondary | ICD-10-CM | POA: Diagnosis not present

## 2022-02-03 DIAGNOSIS — E785 Hyperlipidemia, unspecified: Secondary | ICD-10-CM | POA: Diagnosis not present

## 2022-10-16 DIAGNOSIS — J90 Pleural effusion, not elsewhere classified: Secondary | ICD-10-CM | POA: Diagnosis not present

## 2022-10-16 DIAGNOSIS — J189 Pneumonia, unspecified organism: Secondary | ICD-10-CM | POA: Diagnosis not present

## 2022-10-16 DIAGNOSIS — R053 Chronic cough: Secondary | ICD-10-CM | POA: Diagnosis not present

## 2022-10-16 DIAGNOSIS — D72825 Bandemia: Secondary | ICD-10-CM | POA: Diagnosis not present

## 2022-10-20 ENCOUNTER — Emergency Department (HOSPITAL_COMMUNITY): Payer: Medicare Other

## 2022-10-20 ENCOUNTER — Encounter (HOSPITAL_COMMUNITY): Payer: Self-pay

## 2022-10-20 ENCOUNTER — Inpatient Hospital Stay (HOSPITAL_COMMUNITY)
Admission: EM | Admit: 2022-10-20 | Discharge: 2022-10-28 | DRG: 193 | Disposition: A | Discharge status: Hospice | Payer: Medicare Other | Source: Ambulatory Visit | Attending: Family Medicine | Admitting: Family Medicine

## 2022-10-20 ENCOUNTER — Other Ambulatory Visit: Payer: Self-pay

## 2022-10-20 DIAGNOSIS — A419 Sepsis, unspecified organism: Secondary | ICD-10-CM | POA: Diagnosis not present

## 2022-10-20 DIAGNOSIS — I7300 Raynaud's syndrome without gangrene: Secondary | ICD-10-CM | POA: Diagnosis present

## 2022-10-20 DIAGNOSIS — Z955 Presence of coronary angioplasty implant and graft: Secondary | ICD-10-CM

## 2022-10-20 DIAGNOSIS — W44F3XA Food entering into or through a natural orifice, initial encounter: Secondary | ICD-10-CM | POA: Diagnosis not present

## 2022-10-20 DIAGNOSIS — I5021 Acute systolic (congestive) heart failure: Secondary | ICD-10-CM | POA: Diagnosis present

## 2022-10-20 DIAGNOSIS — R1310 Dysphagia, unspecified: Secondary | ICD-10-CM | POA: Diagnosis present

## 2022-10-20 DIAGNOSIS — F03911 Unspecified dementia, unspecified severity, with agitation: Secondary | ICD-10-CM | POA: Diagnosis present

## 2022-10-20 DIAGNOSIS — Z66 Do not resuscitate: Secondary | ICD-10-CM | POA: Diagnosis present

## 2022-10-20 DIAGNOSIS — Z7982 Long term (current) use of aspirin: Secondary | ICD-10-CM

## 2022-10-20 DIAGNOSIS — I2510 Atherosclerotic heart disease of native coronary artery without angina pectoris: Secondary | ICD-10-CM | POA: Diagnosis present

## 2022-10-20 DIAGNOSIS — Z90710 Acquired absence of both cervix and uterus: Secondary | ICD-10-CM

## 2022-10-20 DIAGNOSIS — Z681 Body mass index (BMI) 19 or less, adult: Secondary | ICD-10-CM

## 2022-10-20 DIAGNOSIS — Z79899 Other long term (current) drug therapy: Secondary | ICD-10-CM

## 2022-10-20 DIAGNOSIS — I10 Essential (primary) hypertension: Secondary | ICD-10-CM | POA: Diagnosis present

## 2022-10-20 DIAGNOSIS — Z86010 Personal history of colonic polyps: Secondary | ICD-10-CM

## 2022-10-20 DIAGNOSIS — I5181 Takotsubo syndrome: Secondary | ICD-10-CM | POA: Diagnosis present

## 2022-10-20 DIAGNOSIS — R918 Other nonspecific abnormal finding of lung field: Secondary | ICD-10-CM | POA: Diagnosis not present

## 2022-10-20 DIAGNOSIS — T17928A Food in respiratory tract, part unspecified causing other injury, initial encounter: Secondary | ICD-10-CM | POA: Diagnosis not present

## 2022-10-20 DIAGNOSIS — R053 Chronic cough: Secondary | ICD-10-CM | POA: Diagnosis not present

## 2022-10-20 DIAGNOSIS — Z885 Allergy status to narcotic agent status: Secondary | ICD-10-CM

## 2022-10-20 DIAGNOSIS — Z1152 Encounter for screening for COVID-19: Secondary | ICD-10-CM

## 2022-10-20 DIAGNOSIS — E43 Unspecified severe protein-calorie malnutrition: Secondary | ICD-10-CM | POA: Insufficient documentation

## 2022-10-20 DIAGNOSIS — J189 Pneumonia, unspecified organism: Principal | ICD-10-CM | POA: Diagnosis present

## 2022-10-20 DIAGNOSIS — E785 Hyperlipidemia, unspecified: Secondary | ICD-10-CM | POA: Diagnosis present

## 2022-10-20 DIAGNOSIS — Z8673 Personal history of transient ischemic attack (TIA), and cerebral infarction without residual deficits: Secondary | ICD-10-CM

## 2022-10-20 DIAGNOSIS — Z87891 Personal history of nicotine dependence: Secondary | ICD-10-CM

## 2022-10-20 DIAGNOSIS — Z9049 Acquired absence of other specified parts of digestive tract: Secondary | ICD-10-CM

## 2022-10-20 DIAGNOSIS — Z8701 Personal history of pneumonia (recurrent): Secondary | ICD-10-CM

## 2022-10-20 DIAGNOSIS — J90 Pleural effusion, not elsewhere classified: Secondary | ICD-10-CM | POA: Diagnosis not present

## 2022-10-20 DIAGNOSIS — Z7189 Other specified counseling: Secondary | ICD-10-CM

## 2022-10-20 DIAGNOSIS — Z515 Encounter for palliative care: Secondary | ICD-10-CM

## 2022-10-20 DIAGNOSIS — Z8249 Family history of ischemic heart disease and other diseases of the circulatory system: Secondary | ICD-10-CM

## 2022-10-20 DIAGNOSIS — Z888 Allergy status to other drugs, medicaments and biological substances status: Secondary | ICD-10-CM

## 2022-10-20 DIAGNOSIS — R64 Cachexia: Secondary | ICD-10-CM | POA: Diagnosis present

## 2022-10-20 DIAGNOSIS — R451 Restlessness and agitation: Secondary | ICD-10-CM

## 2022-10-20 DIAGNOSIS — F05 Delirium due to known physiological condition: Secondary | ICD-10-CM | POA: Diagnosis present

## 2022-10-20 DIAGNOSIS — R0600 Dyspnea, unspecified: Secondary | ICD-10-CM

## 2022-10-20 DIAGNOSIS — D72825 Bandemia: Secondary | ICD-10-CM | POA: Diagnosis not present

## 2022-10-20 DIAGNOSIS — J9601 Acute respiratory failure with hypoxia: Secondary | ICD-10-CM | POA: Diagnosis present

## 2022-10-20 DIAGNOSIS — F0390 Unspecified dementia without behavioral disturbance: Secondary | ICD-10-CM | POA: Insufficient documentation

## 2022-10-20 DIAGNOSIS — Z833 Family history of diabetes mellitus: Secondary | ICD-10-CM

## 2022-10-20 LAB — CBC WITH DIFFERENTIAL/PLATELET
Abs Immature Granulocytes: 0.07 10*3/uL (ref 0.00–0.07)
Basophils Absolute: 0.1 10*3/uL (ref 0.0–0.1)
Basophils Relative: 1 %
Eosinophils Absolute: 0.3 10*3/uL (ref 0.0–0.5)
Eosinophils Relative: 3 %
HCT: 38.7 % (ref 36.0–46.0)
Hemoglobin: 12.7 g/dL (ref 12.0–15.0)
Immature Granulocytes: 1 %
Lymphocytes Relative: 8 %
Lymphs Abs: 0.9 10*3/uL (ref 0.7–4.0)
MCH: 29.5 pg (ref 26.0–34.0)
MCHC: 32.8 g/dL (ref 30.0–36.0)
MCV: 89.8 fL (ref 80.0–100.0)
Monocytes Absolute: 0.7 10*3/uL (ref 0.1–1.0)
Monocytes Relative: 7 %
Neutro Abs: 9.1 10*3/uL — ABNORMAL HIGH (ref 1.7–7.7)
Neutrophils Relative %: 80 %
Platelets: 180 10*3/uL (ref 150–400)
RBC: 4.31 MIL/uL (ref 3.87–5.11)
RDW: 13.6 % (ref 11.5–15.5)
WBC: 11.1 10*3/uL — ABNORMAL HIGH (ref 4.0–10.5)
nRBC: 0 % (ref 0.0–0.2)

## 2022-10-20 LAB — COMPREHENSIVE METABOLIC PANEL
ALT: 13 U/L (ref 0–44)
AST: 18 U/L (ref 15–41)
Albumin: 3.3 g/dL — ABNORMAL LOW (ref 3.5–5.0)
Alkaline Phosphatase: 81 U/L (ref 38–126)
Anion gap: 10 (ref 5–15)
BUN: 25 mg/dL — ABNORMAL HIGH (ref 8–23)
CO2: 24 mmol/L (ref 22–32)
Calcium: 8.9 mg/dL (ref 8.9–10.3)
Chloride: 104 mmol/L (ref 98–111)
Creatinine, Ser: 1.05 mg/dL — ABNORMAL HIGH (ref 0.44–1.00)
GFR, Estimated: 52 mL/min — ABNORMAL LOW (ref >60)
Glucose, Bld: 83 mg/dL (ref 70–99)
Potassium: 3.7 mmol/L (ref 3.5–5.1)
Sodium: 138 mmol/L (ref 135–145)
Total Bilirubin: 0.9 mg/dL (ref 0.3–1.2)
Total Protein: 6.9 g/dL (ref 6.5–8.1)

## 2022-10-20 LAB — URINALYSIS, ROUTINE W REFLEX MICROSCOPIC
Bilirubin Urine: NEGATIVE
Glucose, UA: NEGATIVE mg/dL
Hgb urine dipstick: NEGATIVE
Ketones, ur: 5 mg/dL — AB
Leukocytes,Ua: NEGATIVE
Nitrite: NEGATIVE
Protein, ur: NEGATIVE mg/dL
Specific Gravity, Urine: >1.046 — ABNORMAL HIGH (ref 1.005–1.030)
pH: 6 (ref 5.0–8.0)

## 2022-10-20 LAB — RESP PANEL BY RT-PCR (RSV, FLU A&B, COVID)  RVPGX2
Influenza A by PCR: NEGATIVE
Influenza B by PCR: NEGATIVE
Resp Syncytial Virus by PCR: NEGATIVE
SARS Coronavirus 2 by RT PCR: NEGATIVE

## 2022-10-20 LAB — PROTIME-INR
INR: 1.1 (ref 0.8–1.2)
Prothrombin Time: 14.2 seconds (ref 11.4–15.2)

## 2022-10-20 LAB — LACTIC ACID, PLASMA: Lactic Acid, Venous: 1.8 mmol/L (ref 0.5–1.9)

## 2022-10-20 LAB — APTT: aPTT: 30 seconds (ref 24–36)

## 2022-10-20 MED ORDER — GUAIFENESIN ER 600 MG PO TB12
600.0000 mg | ORAL_TABLET | Freq: Two times a day (BID) | ORAL | Status: DC
  Administered 2022-10-20 – 2022-10-24 (×9): 600 mg via ORAL
  Filled 2022-10-20 (×9): qty 1

## 2022-10-20 MED ORDER — SODIUM CHLORIDE 0.9 % IV SOLN
500.0000 mg | INTRAVENOUS | Status: DC
  Administered 2022-10-20 – 2022-10-22 (×3): 500 mg via INTRAVENOUS
  Filled 2022-10-20 (×4): qty 5

## 2022-10-20 MED ORDER — IOHEXOL 300 MG/ML  SOLN
75.0000 mL | Freq: Once | INTRAMUSCULAR | Status: AC | PRN
  Administered 2022-10-20: 75 mL via INTRAVENOUS

## 2022-10-20 MED ORDER — ALBUTEROL SULFATE (2.5 MG/3ML) 0.083% IN NEBU: 2.5000 mg | INHALATION_SOLUTION | RESPIRATORY_TRACT | Status: DC | PRN

## 2022-10-20 MED ORDER — SODIUM CHLORIDE (PF) 0.9 % IJ SOLN
INTRAMUSCULAR | Status: AC
  Filled 2022-10-20: qty 50

## 2022-10-20 MED ORDER — ACETAMINOPHEN 325 MG PO TABS: 650.0000 mg | ORAL_TABLET | Freq: Four times a day (QID) | ORAL | Status: DC | PRN

## 2022-10-20 MED ORDER — ACETAMINOPHEN 650 MG RE SUPP: 650.0000 mg | Freq: Four times a day (QID) | RECTAL | Status: DC | PRN

## 2022-10-20 MED ORDER — SODIUM CHLORIDE 0.9 % IV SOLN
1.0000 g | INTRAVENOUS | Status: DC
  Administered 2022-10-20 – 2022-10-21 (×2): 1 g via INTRAVENOUS
  Filled 2022-10-20 (×5): qty 10

## 2022-10-20 NOTE — ED Provider Notes (Signed)
[place] DEPT Provider Note   CSN: [id number] Arrival date & time: 10/20/22  1117     History  Chief Complaint  Patient presents with   Pneumonia    Michele Burns is a 87 y.o. female.  87 year old female who presents with recent diagnosis of pneumonia her doctor's office.  Patient has been treated as an outpatient but has felt therapy has patient's dyspnea and chest x-ray have gotten worse.  Denies any fever or chills.  Patient cannot do her ADLs at this time.  Reviewed the patient's office notes from her provider who confirms above.  Was seen this morning for recheck after being seen 3 days ago.  Was sent here for admission       Home Medications Prior to Admission medications   Medication Sig Start Date End Date Taking? Authorizing Provider  aspirin EC 81 MG tablet Take 1 tablet (81 mg total) by mouth daily. Swallow whole. 10/24/20   Bhagat, Crista Luria, PA  donepezil (ARICEPT) 10 MG tablet Take 10 mg by mouth at bedtime.    [provider]  memantine (NAMENDA) 5 MG tablet Take 5 mg by mouth at bedtime.    [provider]  simvastatin (ZOCOR) 40 MG tablet TAKE 1 TABLET BY MOUTH EVERY NIGHT AT BEDTIME. 11/22/18   Weaver, Scott T, PA-C  traZODone (DESYREL) 100 MG tablet Take 100 mg by mouth at bedtime.    [provider]      Allergies    Codeine and Cortisone    Review of Systems   Review of Systems  All other systems reviewed and are negative.   Physical Exam Updated Vital Signs BP 112/84   Pulse 80   Temp 97.9 F (36.6 C) (Oral)   Resp 20   SpO2 99%  Physical Exam Vitals and nursing note reviewed.  Constitutional:      General: She is not in acute distress.    Appearance: Normal appearance. She is well-developed. She is not toxic-appearing.  HENT:     Head: Normocephalic and atraumatic.  Eyes:     General: Lids are normal.     Conjunctiva/sclera: Conjunctivae normal.     Pupils: Pupils are  equal, round, and reactive to light.  Neck:     Thyroid: No thyroid mass.     Trachea: No tracheal deviation.  Cardiovascular:     Rate and Rhythm: Normal rate and regular rhythm.     Heart sounds: Normal heart sounds. No murmur heard.    No gallop.  Pulmonary:     Effort: Pulmonary effort is normal. No respiratory distress.     Breath sounds: No stridor. Examination of the left-lower field reveals decreased breath sounds and rhonchi. Decreased breath sounds and rhonchi present. No wheezing or rales.  Abdominal:     General: There is no distension.     Palpations: Abdomen is soft.     Tenderness: There is no abdominal tenderness. There is no rebound.  Musculoskeletal:        General: No tenderness. Normal range of motion.     Cervical back: Normal range of motion and neck supple.  Skin:    General: Skin is warm and dry.     Findings: No abrasion or rash.  Neurological:     Mental Status: She is alert and oriented to person, place, and time. Mental status is at baseline.     GCS: GCS eye subscore is 4. GCS verbal subscore is 5. GCS motor subscore  is 6.     Cranial Nerves: No cranial nerve deficit.     Sensory: No sensory deficit.     Motor: Motor function is intact.  Psychiatric:        Attention and Perception: Attention normal.        Speech: Speech normal.        Behavior: Behavior normal.     ED Results / Procedures / Treatments   Labs (all labs ordered are listed, but only abnormal results are displayed) Labs Reviewed  CULTURE, BLOOD (ROUTINE X 2)  CULTURE, BLOOD (ROUTINE X 2)  URINE CULTURE  RESP PANEL BY RT-PCR (RSV, FLU A&B, COVID)  [id number]  LACTIC ACID, PLASMA  LACTIC ACID, PLASMA  COMPREHENSIVE METABOLIC PANEL  CBC WITH DIFFERENTIAL/PLATELET  PROTIME-INR  APTT  URINALYSIS, ROUTINE W REFLEX MICROSCOPIC  I-STAT CHEM 8, ED    EKG EKG Interpretation  Date/Time:  Tuesday October 20 2022 12:10:42 EST Ventricular Rate:  78 PR Interval:  102 QRS  Duration: 93 QT Interval:  414 QTC Calculation: 472 R Axis:   -63 Text Interpretation: Sinus or ectopic atrial rhythm Atrial premature complex Short PR interval Left anterior fascicular block Probable anteroseptal infarct, recent Confirmed by Lacretia Leigh ([id number]) on 10/20/2022 12:36:09 PM  Radiology DG Chest Port 1 View  Result Date: 10/20/2022 CLINICAL DATA:  Sepsis EXAM: PORTABLE CHEST 1 VIEW COMPARISON:  03/28/2019 x-ray. FINDINGS: Small bilateral pleural effusion with adjacent opacities. Possible infiltrate. No pneumothorax. Cardiac silhouette is obscured. No edema. IMPRESSION: Bilateral pleural effusions and opacities. Electronically Signed   By: Jill Side M.D.   On: 10/20/2022 12:10    Procedures Procedures    Medications Ordered in ED Medications - No data to display  ED Course/ Medical Decision Making/ A&P                           Medical Decision Making  Patient is EKG per interpretation shows normal sinus rhythm.  Patient's chest x-ray per interpretation shows bilateral pulmonary effusions and opacities consistent with pneumonia.  Patient will empirically be started on IV antibiotics.  Will evaluate for sepsis.  Discussed with patient's daughter who is at bedside about plan for admission and she is agreeable to this along with the patient.  Will consult hospitalist team        Final Clinical Impression(s) / ED Diagnoses Final diagnoses:  None    Rx / DC Orders ED Discharge Orders     None         Lacretia Leigh, MD 10/20/22 1649

## 2022-10-20 NOTE — Progress Notes (Signed)
Patient only oriented to self, not redirectable and keeps trying to get out of bed without assistance, connected to continuous pulse ox, purewick, and tele monitor and also keeps trying to pull everything off. Messaging on-call provider for safety sitter order because we need someone to keep her safe and a telesitter will only alert [place] just as the bed alarm does. She does not redirect by verbal cue, she has to be moved back into position in the bed.

## 2022-10-20 NOTE — H&P (Signed)
History and Physical    Patient: Michele Burns DOB: Dec 06, 1935 DOA: 10/20/2022 DOS: the patient was seen and examined on 10/20/2022 PCP: [name], MD  Patient coming from: Home  Chief Complaint:  Chief Complaint  Patient presents with   Pneumonia   HPI: Michele Burns is a 87 y.o. female with medical history significant of dementia, CAD, HLD. Presenting with shortness of breath and cough. She has had symptoms for the last week. She was seen by her PCP and diagnosed with PNA. She was given a course of abx; however, after completeing them, she is not feeling better. She went to her PCP for follow up today, and it was noted that her PNA was worse. They directed her to the ED for evaluation.     Review of Systems: As mentioned in the history of present illness. All other systems reviewed and are negative. Past Medical History:  Diagnosis Date   Cecal volvulus ([place]) 06/29/2014   Coronary artery disease    a. Cath 11/17/12 - severe Ca++ moderately severe disease in the mid RCA, otherwise nonobstructive disease - s/p PTCA/rotational atherectomy & 2 DES to mid RCA 11/22/12.   DEPRESSION    Erosive gastritis 2006   NSAID precipitated, resolved on f/u EGD   Hx of adenomatous colonic polyps 02/10/2015   Hx of colonic polyp 2006   diminutive '06, no polyps on 4/09 colo - for repeat 4/14   Hyperlipidemia    Hypertension    IBS (irritable bowel syndrome)    MIGRAINE HEADACHE    POSTMENOPAUSAL SYNDROME    PULMONARY NODULE    PVD    normal ABI/TBI 09/2009, chronic L toe pain and vasospasm ([place])   RENAL CYST    Sinus bradycardia    a. not on BB due to this.   Past Surgical History:  Procedure Laterality Date   ABDOMINAL HYSTERECTOMY     artherectomy  11/22/2012   RCA   COLONOSCOPY     ESOPHAGOGASTRODUODENOSCOPY     EXCISION MORTON'S NEUROMA  1971   L 3/4 toe space   LAPAROSCOPIC RIGHT HEMI COLECTOMY  06/29/2014   LAPAROTOMY N/A 06/29/2014   Procedure:  EXPLORATORY LAPAROTOMY;  Surgeon: Stark Klein, MD;  Location: [place];  Service: General;  Laterality: N/A;   LEFT HEART CATHETERIZATION WITH CORONARY ANGIOGRAM N/A 11/17/2012   Procedure: LEFT HEART CATHETERIZATION WITH CORONARY ANGIOGRAM;  Surgeon: Peter M Martinique, MD;  Location: [facility] CATH LAB;  Service: Cardiovascular;  Laterality: N/A;   PARTIAL COLECTOMY N/A 06/29/2014   Procedure: PARTIAL COLECTOMY;  Surgeon: Stark Klein, MD;  Location: [place];  Service: General;  Laterality: N/A;   PERCUTANEOUS CORONARY ROTOBLATOR INTERVENTION (PCI-R)  11/22/2012   Procedure: PERCUTANEOUS CORONARY ROTOBLATOR INTERVENTION (PCI-R);  Surgeon: Peter M Martinique, MD;  Location: [facility] CATH LAB;  Service: Cardiovascular;;   PERCUTANEOUS CORONARY STENT INTERVENTION (PCI-S) N/A 11/22/2012   Procedure: PERCUTANEOUS CORONARY STENT INTERVENTION (PCI-S);  Surgeon: Peter M Martinique, MD;  Location: [facility] CATH LAB;  Service: Cardiovascular;  Laterality: N/A;   PTCA     TONSILLECTOMY     As a child   TONSILLECTOMY     VAGINAL HYSTERECTOMY     Social History:  reports that she quit smoking about 42 years ago. Her smoking use included cigarettes. She has never used smokeless tobacco. She reports current alcohol use of about 10.0 standard drinks of alcohol per week. She reports that she does not use drugs.  Allergies  Allergen Reactions   Codeine Nausea  Only   Cortisone Nausea Only    Family History  Problem Relation Age of Onset   Heart attack Father 6       deceased   Heart attack Mother 68       deceased   Diabetes type II Mother    Diabetes Mother    Heart attack Brother 71       deceased   Diabetes Paternal Grandmother        IDDM   Healthy Son        x 2   Healthy Daughter    Colon cancer Neg Hx    Colon polyps Neg Hx    Kidney disease Neg Hx    Esophageal cancer Neg Hx    Gallbladder disease Neg Hx    Hypertension Neg Hx    Stroke Neg Hx     Prior to Admission medications   Medication Sig Start Date End Date  Taking? Authorizing Provider  aspirin EC 81 MG tablet Take 1 tablet (81 mg total) by mouth daily. Swallow whole. 10/24/20   Bhagat, Crista Luria, PA  donepezil (ARICEPT) 10 MG tablet Take 10 mg by mouth at bedtime.    [provider]  memantine (NAMENDA) 5 MG tablet Take 5 mg by mouth at bedtime.    [provider]  simvastatin (ZOCOR) 40 MG tablet TAKE 1 TABLET BY MOUTH EVERY NIGHT AT BEDTIME. 11/22/18   Weaver, Scott T, PA-C  traZODone (DESYREL) 100 MG tablet Take 100 mg by mouth at bedtime.    [provider]    Physical Exam: Vitals:   10/20/22 1545 10/20/22 1605 10/20/22 1630 10/20/22 1700  BP: 125/78 121/85 116/88 107/84  Pulse: 61 (!) 107 (!) 54 (!) 102  Resp: (!) 21 (!) 22 19 (!) 21  Temp:      TempSrc:      SpO2: 100% 94% 96% 90%  Weight:      Height:       General: 87 y.o. female resting in bed in NAD Eyes: PERRL, normal sclera ENMT: Nares patent w/o discharge, orophaynx clear, dentition normal, ears w/o discharge/lesions/ulcers Neck: Supple, trachea midline Cardiovascular: RRR, +S1, S2, no m/g/r, equal pulses throughout Respiratory: CTABL, no w/r/r, normal WOB GI: BS+, NDNT, no masses noted, no organomegaly noted MSK: No e/c/c Neuro: A&O x 2 (name, year), no focal deficits Psyc: Appropriate interaction and affect, calm/cooperative  Data Reviewed:  Results for orders placed or performed during the hospital encounter of 10/20/22 (from the past 24 hour(s))  Resp panel by RT-PCR (RSV, Flu A&B, Covid) Anterior Nasal Swab     Status: None   Collection Time: 10/20/22  2:07 PM   Specimen: Anterior Nasal Swab  Result Value Ref Range   SARS Coronavirus 2 by RT PCR NEGATIVE NEGATIVE   Influenza A by PCR NEGATIVE NEGATIVE   Influenza B by PCR NEGATIVE NEGATIVE   Resp Syncytial Virus by PCR NEGATIVE NEGATIVE  Lactic acid, plasma     Status: None   Collection Time: 10/20/22  3:05 PM  Result Value Ref Range   Lactic Acid, Venous 1.8 0.5 - 1.9 mmol/L   Blood Culture (routine x 2)     Status: None (Preliminary result)   Collection Time: 10/20/22  3:05 PM   Specimen: BLOOD LEFT ARM  Result Value Ref Range   Specimen Description      BLOOD LEFT ARM Performed at [facility], [address] [place], [place] [address]    Special Requests  BOTTLES DRAWN AEROBIC AND ANAEROBIC Blood Culture adequate volume Performed at [facility] [address] [place], [place] [address]    Culture PENDING    Report Status PENDING   Comprehensive metabolic panel     Status: Abnormal   Collection Time: 10/20/22  3:19 PM  Result Value Ref Range   Sodium 138 135 - 145 mmol/L   Potassium 3.7 3.5 - 5.1 mmol/L   Chloride 104 98 - 111 mmol/L   CO2 24 22 - 32 mmol/L   Glucose, Bld 83 70 - 99 mg/dL   BUN 25 (H) 8 - 23 mg/dL   Creatinine, Ser 1.05 (H) 0.44 - 1.00 mg/dL   Calcium 8.9 8.9 - 10.3 mg/dL   Total Protein 6.9 6.5 - 8.1 g/dL   Albumin 3.3 (L) 3.5 - 5.0 g/dL   AST 18 15 - 41 U/L   ALT 13 0 - 44 U/L   Alkaline Phosphatase 81 38 - 126 U/L   Total Bilirubin 0.9 0.3 - 1.2 mg/dL   GFR, Estimated 52 (L) >60 mL/min   Anion gap 10 5 - 15  CBC with Differential     Status: Abnormal   Collection Time: 10/20/22  3:19 PM  Result Value Ref Range   WBC 11.1 (H) 4.0 - 10.5 K/uL   RBC 4.31 3.87 - 5.11 MIL/uL   Hemoglobin 12.7 12.0 - 15.0 g/dL   HCT 38.7 36.0 - 46.0 %   MCV 89.8 80.0 - 100.0 fL   MCH 29.5 26.0 - 34.0 pg   MCHC 32.8 30.0 - 36.0 g/dL   RDW 13.6 11.5 - 15.5 %   Platelets 180 150 - 400 K/uL   nRBC 0.0 0.0 - 0.2 %   Neutrophils Relative % 80 %   Neutro Abs 9.1 (H) 1.7 - 7.7 K/uL   Lymphocytes Relative 8 %   Lymphs Abs 0.9 0.7 - 4.0 K/uL   Monocytes Relative 7 %   Monocytes Absolute 0.7 0.1 - 1.0 K/uL   Eosinophils Relative 3 %   Eosinophils Absolute 0.3 0.0 - 0.5 K/uL   Basophils Relative 1 %   Basophils Absolute 0.1 0.0 - 0.1 K/uL   Immature Granulocytes 1 %   Abs Immature Granulocytes 0.07 0.00 - 0.07  K/uL  Protime-INR     Status: None   Collection Time: 10/20/22  3:19 PM  Result Value Ref Range   Prothrombin Time 14.2 11.4 - 15.2 seconds   INR 1.1 0.8 - 1.2  APTT     Status: None   Collection Time: 10/20/22  3:19 PM  Result Value Ref Range   aPTT 30 24 - 36 seconds   CXR: Bilateral pleural effusions and opacities.   Assessment and Plan: CAP     - place in obs, med-surg     - continue rocephin, azithro     - guaifenesin; PRN nebs     - check urine legionella/strep, check MRSA     - wean O2 as able  Dementia, unspecified     - continue home regimen when confirmed  CAD HLD     - continue home regimen when confirmed  Advance Care Planning:   Code Status: DNR, confirmed w/ son  Consults: None  Family Communication: w/ son by phone  Severity of Illness: The appropriate patient status for this patient is OBSERVATION. Observation status is judged to be reasonable and necessary in order to provide the required intensity of service to ensure the patient's safety. The  patient's presenting symptoms, physical exam findings, and initial radiographic and laboratory data in the context of their medical condition is felt to place them at decreased risk for further clinical deterioration. Furthermore, it is anticipated that the patient will be medically stable for discharge from the hospital within 2 midnights of admission.   Author: Jonnie Finner, DO 10/20/2022 5:33 PM  For on call review [email or web address].

## 2022-10-20 NOTE — ED Provider Triage Note (Signed)
Emergency Medicine Provider Triage Evaluation Note  Michele Burns , a 87 y.o. female  was evaluated in triage.  Pt complains of hoarse for a few weeks, worse last week and seen at PCP diagnosed with bilateral PNA, IM abx, oral abx, inhaler. Went back to PCP today, O2 sats worse (85% ambulatory but had difficulty getting a read), sig DOE. Difficulty with ADLs. Dehydrated per PCP.  Brought in by daughter Review of Systems  Positive: SHOB/DOE, cough (dry) Negative: Fevers, chills, CP, lower ext swelling  Physical Exam  BP 101/77 (BP Location: Left Arm)   Pulse 83   Temp 97.9 F (36.6 C) (Oral)   Resp 19   SpO2 98%  Gen:   Awake, no distress   Resp:  Normal effort  MSK:   Moves extremities without difficulty  Other:    Medical Decision Making  Medically screening exam initiated at 11:36 AM.  Appropriate orders placed.  Michele Burns was informed that the remainder of the evaluation will be completed by another provider, this initial triage assessment does not replace that evaluation, and the importance of remaining in the ED until their evaluation is complete.     Tacy Learn, PA-C 10/20/22 1139

## 2022-10-20 NOTE — ED Triage Notes (Signed)
Patient sent from her primary care doctor after recent diagnosis from pneumonia a week ago. Still taking antibiotics. Daughter said patient is much weaker. Had a chest Xray with worsening pneumonia and effusions. 85% oxygen saturation ambulating and 95% at rest.

## 2022-10-20 NOTE — ED Notes (Signed)
Patient transported to CT 

## 2022-10-21 DIAGNOSIS — R64 Cachexia: Secondary | ICD-10-CM | POA: Diagnosis present

## 2022-10-21 DIAGNOSIS — Z8673 Personal history of transient ischemic attack (TIA), and cerebral infarction without residual deficits: Secondary | ICD-10-CM | POA: Diagnosis not present

## 2022-10-21 DIAGNOSIS — J9601 Acute respiratory failure with hypoxia: Secondary | ICD-10-CM | POA: Diagnosis present

## 2022-10-21 DIAGNOSIS — J189 Pneumonia, unspecified organism: Secondary | ICD-10-CM | POA: Diagnosis not present

## 2022-10-21 DIAGNOSIS — J910 Malignant pleural effusion: Secondary | ICD-10-CM | POA: Diagnosis not present

## 2022-10-21 DIAGNOSIS — Z833 Family history of diabetes mellitus: Secondary | ICD-10-CM | POA: Diagnosis not present

## 2022-10-21 DIAGNOSIS — F05 Delirium due to known physiological condition: Secondary | ICD-10-CM | POA: Diagnosis present

## 2022-10-21 DIAGNOSIS — R0602 Shortness of breath: Secondary | ICD-10-CM | POA: Diagnosis not present

## 2022-10-21 DIAGNOSIS — Z7189 Other specified counseling: Secondary | ICD-10-CM | POA: Diagnosis not present

## 2022-10-21 DIAGNOSIS — Z515 Encounter for palliative care: Secondary | ICD-10-CM | POA: Diagnosis not present

## 2022-10-21 DIAGNOSIS — Z86010 Personal history of colonic polyps: Secondary | ICD-10-CM | POA: Diagnosis not present

## 2022-10-21 DIAGNOSIS — F0394 Unspecified dementia, unspecified severity, with anxiety: Secondary | ICD-10-CM

## 2022-10-21 DIAGNOSIS — Z1152 Encounter for screening for COVID-19: Secondary | ICD-10-CM | POA: Diagnosis not present

## 2022-10-21 DIAGNOSIS — F02818 Dementia in other diseases classified elsewhere, unspecified severity, with other behavioral disturbance: Secondary | ICD-10-CM | POA: Diagnosis not present

## 2022-10-21 DIAGNOSIS — W44F3XA Food entering into or through a natural orifice, initial encounter: Secondary | ICD-10-CM | POA: Diagnosis not present

## 2022-10-21 DIAGNOSIS — J90 Pleural effusion, not elsewhere classified: Secondary | ICD-10-CM | POA: Diagnosis not present

## 2022-10-21 DIAGNOSIS — Z87891 Personal history of nicotine dependence: Secondary | ICD-10-CM | POA: Diagnosis not present

## 2022-10-21 DIAGNOSIS — R1310 Dysphagia, unspecified: Secondary | ICD-10-CM | POA: Diagnosis present

## 2022-10-21 DIAGNOSIS — R911 Solitary pulmonary nodule: Secondary | ICD-10-CM | POA: Diagnosis not present

## 2022-10-21 DIAGNOSIS — I5181 Takotsubo syndrome: Secondary | ICD-10-CM | POA: Diagnosis present

## 2022-10-21 DIAGNOSIS — E785 Hyperlipidemia, unspecified: Secondary | ICD-10-CM | POA: Diagnosis not present

## 2022-10-21 DIAGNOSIS — I7300 Raynaud's syndrome without gangrene: Secondary | ICD-10-CM | POA: Diagnosis present

## 2022-10-21 DIAGNOSIS — R531 Weakness: Secondary | ICD-10-CM | POA: Diagnosis not present

## 2022-10-21 DIAGNOSIS — Z8701 Personal history of pneumonia (recurrent): Secondary | ICD-10-CM | POA: Diagnosis not present

## 2022-10-21 DIAGNOSIS — I5021 Acute systolic (congestive) heart failure: Secondary | ICD-10-CM | POA: Diagnosis not present

## 2022-10-21 DIAGNOSIS — Z955 Presence of coronary angioplasty implant and graft: Secondary | ICD-10-CM | POA: Diagnosis not present

## 2022-10-21 DIAGNOSIS — E43 Unspecified severe protein-calorie malnutrition: Secondary | ICD-10-CM | POA: Diagnosis not present

## 2022-10-21 DIAGNOSIS — I2510 Atherosclerotic heart disease of native coronary artery without angina pectoris: Secondary | ICD-10-CM | POA: Diagnosis present

## 2022-10-21 DIAGNOSIS — Z681 Body mass index (BMI) 19 or less, adult: Secondary | ICD-10-CM | POA: Diagnosis not present

## 2022-10-21 DIAGNOSIS — I10 Essential (primary) hypertension: Secondary | ICD-10-CM | POA: Diagnosis present

## 2022-10-21 DIAGNOSIS — T17928A Food in respiratory tract, part unspecified causing other injury, initial encounter: Secondary | ICD-10-CM | POA: Diagnosis not present

## 2022-10-21 DIAGNOSIS — F03911 Unspecified dementia, unspecified severity, with agitation: Secondary | ICD-10-CM | POA: Diagnosis present

## 2022-10-21 DIAGNOSIS — G309 Alzheimer's disease, unspecified: Secondary | ICD-10-CM | POA: Diagnosis not present

## 2022-10-21 DIAGNOSIS — Z66 Do not resuscitate: Secondary | ICD-10-CM | POA: Diagnosis present

## 2022-10-21 LAB — COMPREHENSIVE METABOLIC PANEL
ALT: 12 U/L (ref 0–44)
AST: 17 U/L (ref 15–41)
Albumin: 2.4 g/dL — ABNORMAL LOW (ref 3.5–5.0)
Alkaline Phosphatase: 59 U/L (ref 38–126)
Anion gap: 10 (ref 5–15)
BUN: 22 mg/dL (ref 8–23)
CO2: 21 mmol/L — ABNORMAL LOW (ref 22–32)
Calcium: 8.5 mg/dL — ABNORMAL LOW (ref 8.9–10.3)
Chloride: 107 mmol/L (ref 98–111)
Creatinine, Ser: 0.95 mg/dL (ref 0.44–1.00)
GFR, Estimated: 59 mL/min — ABNORMAL LOW (ref >60)
Glucose, Bld: 85 mg/dL (ref 70–99)
Potassium: 3.8 mmol/L (ref 3.5–5.1)
Sodium: 138 mmol/L (ref 135–145)
Total Bilirubin: 0.8 mg/dL (ref 0.3–1.2)
Total Protein: 5.4 g/dL — ABNORMAL LOW (ref 6.5–8.1)

## 2022-10-21 LAB — CBC
HCT: 34.9 % — ABNORMAL LOW (ref 36.0–46.0)
Hemoglobin: 11.2 g/dL — ABNORMAL LOW (ref 12.0–15.0)
MCH: 29.4 pg (ref 26.0–34.0)
MCHC: 32.1 g/dL (ref 30.0–36.0)
MCV: 91.6 fL (ref 80.0–100.0)
Platelets: 192 10*3/uL (ref 150–400)
RBC: 3.81 MIL/uL — ABNORMAL LOW (ref 3.87–5.11)
RDW: 13.9 % (ref 11.5–15.5)
WBC: 9.3 10*3/uL (ref 4.0–10.5)
nRBC: 0 % (ref 0.0–0.2)

## 2022-10-21 LAB — STREP PNEUMONIAE URINARY ANTIGEN: Strep Pneumo Urinary Antigen: NEGATIVE

## 2022-10-21 LAB — URINE CULTURE: Culture: NO GROWTH

## 2022-10-21 LAB — MRSA NEXT GEN BY PCR, NASAL: MRSA by PCR Next Gen: NOT DETECTED

## 2022-10-21 MED ORDER — SODIUM CHLORIDE 0.9 % IV SOLN: INTRAVENOUS | Status: DC

## 2022-10-21 NOTE — Progress Notes (Signed)
[facility]                                                                               Michele Burns, is a 87 y.o. female, DOB - October 22, 1935, [id number] Admit date - 10/20/2022    Outpatient Primary MD for the patient is [name], MD  LOS - 0  days    Brief summary   Michele Burns is a 87 y.o. female with medical history significant of dementia, CAD, HLD. Presenting with shortness of breath and cough. She has had symptoms for the last week. She was seen by her PCP and diagnosed with PNA. She was given a course of abx; however, after completeing them, she is not feeling better. She went to her PCP for follow up today, and it was noted that her PNA was worse. They directed her to the ED for evaluation.     Assessment & Plan    Assessment and Plan:    Community Acquired pneumonia:  Continue with IV antibiotics. [place] oxygen to keep sats greater than 90% Currently she is requiring up to 3 L of nasal cannula oxygen. Continue with cough medication and nebulizers as needed.     Dementia No agitation at this time.     Hyperlipidemia -Statin on hold at this time.   Hypertension Resolved.  Estimated body mass index is 15.98 kg/m as calculated from the following:   Height as of this encounter: '5\' 6"'$  (1.676 m).   Weight as of this encounter: 44.9 kg.  Code Status: DNR.  DVT Prophylaxis:  SCDs Start: 10/20/22 2037   Level of Care: Level of care: Med-Surg Family Communication: daughter at bedside.   Disposition Plan:     Remains inpatient appropriate:  IV antibiotics.  Procedures:  None.  Consultants:   None.   Antimicrobials:   Anti-infectives (From admission, onward)    Start     Dose/Rate Route Frequency Ordered Stop   10/20/22 1445  azithromycin (ZITHROMAX) 500 mg in sodium chloride 0.9 % 250 mL IVPB        500 mg 250 mL/hr over 60 Minutes Intravenous Every 24 hours 10/20/22 1432     10/20/22 1445  cefTRIAXone (ROCEPHIN) 1 g in  sodium chloride 0.9 % 100 mL IVPB        1 g 200 mL/hr over 30 Minutes Intravenous Every 24 hours 10/20/22 1432          Medications  Scheduled Meds:  guaiFENesin  600 mg Oral BID   Continuous Infusions:  azithromycin Stopped (10/20/22 1708)   cefTRIAXone (ROCEPHIN)  IV Stopped (10/20/22 1601)   PRN Meds:.acetaminophen **OR** acetaminophen, albuterol    Subjective:   [name] was seen and examined today.  Patient still coughing at this time  Objective:   Vitals:   10/20/22 2136 10/20/22 2200 10/21/22 0103 10/21/22 1352  BP:   131/75 129/84  Pulse: 61  61 78  Resp:   18 16  Temp:   97.8 F (36.6 C) (!) 97.4 F (36.3 C)  TempSrc:   Oral Oral  SpO2: 94% (!) 84% 96% 100%  Weight:      Height:  Intake/Output Summary (Last 24 hours) at 10/21/2022 1427 Last data filed at 10/21/2022 1400 Gross per 24 hour  Intake 710 ml  Output 0 ml  Net 710 ml   Filed Weights   10/20/22 1445 10/20/22 2135  Weight: 53.2 kg 44.9 kg     Exam General exam: Appears calm and comfortable  Respiratory system: Clear to auscultation. Respiratory effort normal. Cardiovascular system: S1 & S2 heard, RRR. No JVD,  Gastrointestinal system: Abdomen is nondistended, soft and nontender.  Central nervous system: Alert and oriented. No focal neurological deficits. Extremities: Symmetric 5 x 5 power. Skin: No rashes, lesions or ulcers Psychiatry: mood is appropriate   Data Reviewed:  I have personally reviewed following labs and imaging studies   CBC Lab Results  Component Value Date   WBC 9.3 10/21/2022   RBC 3.81 (L) 10/21/2022   HGB 11.2 (L) 10/21/2022   HCT 34.9 (L) 10/21/2022   MCV 91.6 10/21/2022   MCH 29.4 10/21/2022   PLT 192 10/21/2022   MCHC 32.1 10/21/2022   RDW 13.9 10/21/2022   LYMPHSABS 0.9 10/20/2022   MONOABS 0.7 10/20/2022   EOSABS 0.3 10/20/2022   BASOSABS 0.1 76/72/0947     Last metabolic panel Lab Results  Component Value Date   NA 138  10/21/2022   K 3.8 10/21/2022   CL 107 10/21/2022   CO2 21 (L) 10/21/2022   BUN 22 10/21/2022   CREATININE 0.95 10/21/2022   GLUCOSE 85 10/21/2022   GFRNONAA 59 (L) 10/21/2022   GFRAA 60 08/26/2020   CALCIUM 8.5 (L) 10/21/2022   PROT 5.4 (L) 10/21/2022   ALBUMIN 2.4 (L) 10/21/2022   BILITOT 0.8 10/21/2022   ALKPHOS 59 10/21/2022   AST 17 10/21/2022   ALT 12 10/21/2022   ANIONGAP 10 10/21/2022    CBG (last 3)  No results for input(s): "GLUCAP" in the last 72 hours.    Coagulation Profile: Recent Labs  Lab 10/20/22 1519  INR 1.1     Radiology Studies: CT Chest W Contrast  Result Date: 10/20/2022 CLINICAL DATA:  Dyspnea on exertion EXAM: CT CHEST WITH CONTRAST TECHNIQUE: Multidetector CT imaging of the chest was performed during intravenous contrast administration. RADIATION DOSE REDUCTION: This exam was performed according to the departmental dose-optimization program which includes automated exposure control, adjustment of the mA and/or kV according to patient size and/or use of iterative reconstruction technique. CONTRAST:  4m OMNIPAQUE IOHEXOL 300 MG/ML  SOLN COMPARISON:  Chest radiograph dated 10/20/2022, CT chest dated 08/21/2020, CT abdomen and pelvis dated 06/29/2014 FINDINGS: Cardiovascular: Normal heart size. No significant pericardial fluid/thickening. Coronary artery calcifications and aortic atherosclerosis. Great vessels are normal in course and caliber. No central pulmonary emboli. Mediastinum/Nodes: Imaged thyroid gland without nodules meeting criteria for imaging follow-up by size. Normal esophagus. Right hilar lymph node measures 15 mm. Subcarinal lymph node measures 10 mm. Lungs/Pleura: The central airways are patent. Secretions in the trachea. Bilateral lower lobe, middle lobe, and lingular consolidation. Multifocal pulmonary nodules and scattered ground-glass densities measuring up to 2.0 x 1.5 cm in the left lower lobe (7:82). No pneumothorax. Large right and  moderate left pleural effusions. Upper abdomen: Unchanged 15 mm right upper pole simple cyst (2:146). No follow-up imaging recommended. Musculoskeletal: No acute or abnormal lytic or blastic osseous lesions. IMPRESSION: 1. Bilateral lower lobe, middle lobe, and lingular consolidation, suspicious for multifocal pneumonia. 2. Multifocal pulmonary nodules and scattered ground-glass densities measuring up to 2.0 x 1.5 cm in the left lower lobe, may be infectious/inflammatory, however  malignancy is not excluded. Non-contrast chest CT at 3-6 months is recommended. If nodules persist, subsequent management will be based upon the most suspicious nodule(s). This recommendation follows the consensus statement: Guidelines for Management of Incidental Pulmonary Nodules Detected on CT Images: From the Fleischner Society 2017; Radiology 2017; 284:228-243. 3. Large right and moderate left pleural effusions. 4. Mediastinal and right hilar lymphadenopathy, likely reactive. 5. Coronary artery calcifications. Aortic Atherosclerosis (ICD10-I70.0). Electronically Signed   By: Darrin Nipper M.D.   On: 10/20/2022 17:55   DG Chest Port 1 View  Result Date: 10/20/2022 CLINICAL DATA:  Sepsis EXAM: PORTABLE CHEST 1 VIEW COMPARISON:  03/28/2019 x-ray. FINDINGS: Small bilateral pleural effusion with adjacent opacities. Possible infiltrate. No pneumothorax. Cardiac silhouette is obscured. No edema. IMPRESSION: Bilateral pleural effusions and opacities. Electronically Signed   By: Jill Side M.D.   On: 10/20/2022 12:10       Hosie Poisson M.D. [facility] 10/21/2022, 2:27 PM  Available via Epic secure chat 7am-7pm After 7 pm, please refer to night coverage provider listed on amion.

## 2022-10-21 NOTE — Plan of Care (Signed)
?  Problem: Coping: ?Goal: Level of anxiety will decrease ?Outcome: Progressing ?  ?Problem: Safety: ?Goal: Ability to remain free from injury will improve ?Outcome: Progressing ?  ?

## 2022-10-22 DIAGNOSIS — F0394 Unspecified dementia, unspecified severity, with anxiety: Secondary | ICD-10-CM | POA: Diagnosis not present

## 2022-10-22 DIAGNOSIS — J189 Pneumonia, unspecified organism: Secondary | ICD-10-CM | POA: Diagnosis not present

## 2022-10-22 DIAGNOSIS — E43 Unspecified severe protein-calorie malnutrition: Secondary | ICD-10-CM | POA: Insufficient documentation

## 2022-10-22 LAB — LEGIONELLA PNEUMOPHILA SEROGP 1 UR AG: L. pneumophila Serogp 1 Ur Ag: NEGATIVE

## 2022-10-22 LAB — FOLATE: Folate: 4.4 ng/mL — ABNORMAL LOW (ref >5.9)

## 2022-10-22 LAB — VITAMIN B12: Vitamin B-12: 178 pg/mL — ABNORMAL LOW (ref 180–914)

## 2022-10-22 MED ORDER — BOOST / RESOURCE BREEZE PO LIQD CUSTOM
1.0000 | Freq: Three times a day (TID) | ORAL | Status: DC
  Administered 2022-10-24: 1 via ORAL

## 2022-10-22 MED ORDER — AMOXICILLIN-POT CLAVULANATE 875-125 MG PO TABS
1.0000 | ORAL_TABLET | Freq: Two times a day (BID) | ORAL | Status: DC
  Administered 2022-10-22 – 2022-10-24 (×5): 1 via ORAL
  Filled 2022-10-22 (×5): qty 1

## 2022-10-22 MED ORDER — ADULT MULTIVITAMIN W/MINERALS CH
1.0000 | ORAL_TABLET | Freq: Every day | ORAL | Status: DC
  Administered 2022-10-22 – 2022-10-24 (×3): 1 via ORAL
  Filled 2022-10-22 (×2): qty 1

## 2022-10-22 NOTE — Progress Notes (Signed)
Patient has pulled out multiple IVs, MD made aware and stated we could leave IV out at this time

## 2022-10-22 NOTE — Progress Notes (Signed)
[facility]                                                                               Michele Burns, is a 87 y.o. female, DOB - 1936-08-11, IRW:[id number] Admit date - 10/20/2022    Outpatient Primary MD for the patient is [name], MD  LOS - 1  days    Brief summary   Michele Burns is a 87 y.o. female with medical history significant of dementia, CAD, HLD. Presenting with shortness of breath and cough. She has had symptoms for the last week. She was seen by her PCP and diagnosed with PNA. She was given a course of abx; however, after completeing them, she is not feeling better. She went to her PCP for follow up today, and it was noted that her PNA was worse. They directed her to the ED for evaluation.     Assessment & Plan    Assessment and Plan:    Community Acquired pneumonia:  Continue with IV antibiotics. [place] oxygen to keep sats greater than 90% Currently she is requiring up to 3 L of nasal cannula oxygen. Slowly wean her off oxygen as tolerated.  Continue with cough medication and nebulizers as needed.     Dementia No agitation at this time.     Hyperlipidemia -Statin on hold at this time.   Therapy eval recommending SNF.    Protein calorie malnutrition   Estimated body mass index is 15.98 kg/m as calculated from the following:   Height as of this encounter: '5\' 6"'$  (1.676 m).   Weight as of this encounter: 44.9 kg.  Code Status: DNR.  DVT Prophylaxis:  SCDs Start: 10/20/22 2037   Level of Care: Level of care: Med-Surg Family Communication: daughter at bedside.   Disposition Plan:     Remains inpatient appropriate:  IV antibiotics.  Procedures:  None.  Consultants:   None.   Antimicrobials:   Anti-infectives (From admission, onward)    Start     Dose/Rate Route Frequency Ordered Stop   10/20/22 1445  azithromycin (ZITHROMAX) 500 mg in sodium chloride 0.9 % 250 mL IVPB        500 mg 250 mL/hr over 60 Minutes  Intravenous Every 24 hours 10/20/22 1432     10/20/22 1445  cefTRIAXone (ROCEPHIN) 1 g in sodium chloride 0.9 % 100 mL IVPB        1 g 200 mL/hr over 30 Minutes Intravenous Every 24 hours 10/20/22 1432          Medications  Scheduled Meds:  feeding supplement  1 Container Oral TID BM   guaiFENesin  600 mg Oral BID   multivitamin with minerals  1 tablet Oral Daily   Continuous Infusions:  sodium chloride 75 mL/hr at 10/21/22 1808   azithromycin 500 mg (10/21/22 1938)   cefTRIAXone (ROCEPHIN)  IV 1 g (10/21/22 1832)   PRN Meds:.acetaminophen **OR** acetaminophen, albuterol    Subjective:   Michele Burns was seen and examined today.  Cough has improved.   Objective:   Vitals:   10/22/22 0509 10/22/22 0806 10/22/22 1249 10/22/22 1318  BP: (!) 124/95 135/77 134/81   Pulse: Marland Kitchen)  58 (!) 56 65   Resp: '16 16 16   '$ Temp: (!) 97.3 F (36.3 C) (!) 97.4 F (36.3 C) (!) 97.5 F (36.4 C)   TempSrc: Oral Oral Oral   SpO2: 98% 96% 98% 94%  Weight:      Height:        Intake/Output Summary (Last 24 hours) at 10/22/2022 1352 Last data filed at 10/22/2022 1347 Gross per 24 hour  Intake 1180.47 ml  Output 0 ml  Net 1180.47 ml    Filed Weights   10/20/22 1445 10/20/22 2135  Weight: 53.2 kg 44.9 kg     Exam General exam: Appears calm and comfortable  Respiratory system: Clear to auscultation. Respiratory effort normal. Cardiovascular system: S1 & S2 heard, RRR. No JVD,  No pedal edema. Gastrointestinal system: Abdomen is nondistended, soft and nontender.  Central nervous system: Alert and oriented to place and person only.  Extremities: Symmetric 5 x 5 power. Skin: No rashes, Psychiatry:Mood & affect appropriate.     Data Reviewed:  I have personally reviewed following labs and imaging studies   CBC Lab Results  Component Value Date   WBC 9.3 10/21/2022   RBC 3.81 (L) 10/21/2022   HGB 11.2 (L) 10/21/2022   HCT 34.9 (L) 10/21/2022   MCV 91.6 10/21/2022   MCH  29.4 10/21/2022   PLT 192 10/21/2022   MCHC 32.1 10/21/2022   RDW 13.9 10/21/2022   LYMPHSABS 0.9 10/20/2022   MONOABS 0.7 10/20/2022   EOSABS 0.3 10/20/2022   BASOSABS 0.1 71/24/5809     Last metabolic panel Lab Results  Component Value Date   NA 138 10/21/2022   K 3.8 10/21/2022   CL 107 10/21/2022   CO2 21 (L) 10/21/2022   BUN 22 10/21/2022   CREATININE 0.95 10/21/2022   GLUCOSE 85 10/21/2022   GFRNONAA 59 (L) 10/21/2022   GFRAA 60 08/26/2020   CALCIUM 8.5 (L) 10/21/2022   PROT 5.4 (L) 10/21/2022   ALBUMIN 2.4 (L) 10/21/2022   BILITOT 0.8 10/21/2022   ALKPHOS 59 10/21/2022   AST 17 10/21/2022   ALT 12 10/21/2022   ANIONGAP 10 10/21/2022    CBG (last 3)  No results for input(s): "GLUCAP" in the last 72 hours.    Coagulation Profile: Recent Labs  Lab 10/20/22 1519  INR 1.1      Radiology Studies: CT Chest W Contrast  Result Date: 10/20/2022 CLINICAL DATA:  Dyspnea on exertion EXAM: CT CHEST WITH CONTRAST TECHNIQUE: Multidetector CT imaging of the chest was performed during intravenous contrast administration. RADIATION DOSE REDUCTION: This exam was performed according to the departmental dose-optimization program which includes automated exposure control, adjustment of the mA and/or kV according to patient size and/or use of iterative reconstruction technique. CONTRAST:  51m OMNIPAQUE IOHEXOL 300 MG/ML  SOLN COMPARISON:  Chest radiograph dated 10/20/2022, CT chest dated 08/21/2020, CT abdomen and pelvis dated 06/29/2014 FINDINGS: Cardiovascular: Normal heart size. No significant pericardial fluid/thickening. Coronary artery calcifications and aortic atherosclerosis. Great vessels are normal in course and caliber. No central pulmonary emboli. Mediastinum/Nodes: Imaged thyroid gland without nodules meeting criteria for imaging follow-up by size. Normal esophagus. Right hilar lymph node measures 15 mm. Subcarinal lymph node measures 10 mm. Lungs/Pleura: The central  airways are patent. Secretions in the trachea. Bilateral lower lobe, middle lobe, and lingular consolidation. Multifocal pulmonary nodules and scattered ground-glass densities measuring up to 2.0 x 1.5 cm in the left lower lobe (7:82). No pneumothorax. Large right and moderate left pleural effusions. Upper  abdomen: Unchanged 15 mm right upper pole simple cyst (2:146). No follow-up imaging recommended. Musculoskeletal: No acute or abnormal lytic or blastic osseous lesions. IMPRESSION: 1. Bilateral lower lobe, middle lobe, and lingular consolidation, suspicious for multifocal pneumonia. 2. Multifocal pulmonary nodules and scattered ground-glass densities measuring up to 2.0 x 1.5 cm in the left lower lobe, may be infectious/inflammatory, however malignancy is not excluded. Non-contrast chest CT at 3-6 months is recommended. If nodules persist, subsequent management will be based upon the most suspicious nodule(s). This recommendation follows the consensus statement: Guidelines for Management of Incidental Pulmonary Nodules Detected on CT Images: From the Fleischner Society 2017; Radiology 2017; 284:228-243. 3. Large right and moderate left pleural effusions. 4. Mediastinal and right hilar lymphadenopathy, likely reactive. 5. Coronary artery calcifications. Aortic Atherosclerosis (ICD10-I70.0). Electronically Signed   By: Darrin Nipper M.D.   On: 10/20/2022 17:55       Hosie Poisson M.D. [facility] 10/22/2022, 1:52 PM  Available via Epic secure chat 7am-7pm After 7 pm, please refer to night coverage provider listed on amion.

## 2022-10-22 NOTE — Progress Notes (Signed)
Occupational Therapy Evaluation Patient Details Name: Michele Burns MRN: [id number] DOB: September 21, 1936 Today's Date: 10/22/2022   History of Present Illness Michele Burns is a 87 y.o. female with medical history significant of dementia, CAD, HLD. Presenting with shortness of breath and cough. She has had symptoms for the last week. She was seen by her PCP and diagnosed with PNA. She was given a course of abx; however, after completeing them, she is not feeling better. She went to her PCP for follow up today, and it was noted that her PNA was worse   Clinical Impression   Pt admitted with the above. Pt currently with functional limitations due to the deficits listed below (see OT Problem List).  Pt will benefit from skilled OT to increase their safety and independence with ADL and functional mobility for ADL to facilitate discharge to venue listed below.        Recommendations for follow up therapy are one component of a multi-disciplinary discharge planning process, led by the attending physician.  Recommendations may be updated based on patient status, additional functional criteria and insurance authorization.   Follow Up Recommendations  Skilled nursing-short term rehab (<3 hours/day)  or A at her [place] Recommended at Discharge Intermittent Supervision/Assistance  Patient can return home with the following A little help with walking and/or transfers;A little help with bathing/dressing/bathroom;Assistance with cooking/housework    Functional Status Assessment  Patient has had a recent decline in their functional status and demonstrates the ability to make significant improvements in function in a reasonable and predictable amount of time.  Equipment Recommendations  BSC/3in1    Recommendations for Other Services       Precautions / Restrictions Precautions Precautions: Fall      Mobility Bed Mobility Overal bed mobility: Independent                   Transfers Overall transfer level: Needs assistance Equipment used: 1 person hand held assist Transfers: Sit to/from Stand Sit to Stand: Min assist                  Balance Overall balance assessment: Needs assistance Sitting-balance support: Single extremity supported                                       ADL either performed or assessed with clinical judgement   ADL Overall ADL's : Needs assistance/impaired Eating/Feeding: Minimal assistance   Grooming: Wash/dry face;Cueing for safety;Minimal assistance   Upper Body Bathing: Minimal assistance;Sitting   Lower Body Bathing: Minimal assistance;Sit to/from stand;Cueing for safety;Cueing for sequencing   Upper Body Dressing : Minimal assistance;Sitting   Lower Body Dressing: Minimal assistance;Sit to/from stand;Cueing for safety;Cueing for sequencing   Toilet Transfer: Minimal assistance;Cueing for safety;Cueing for sequencing;Stand-pivot   Toileting- Clothing Manipulation and Hygiene: Minimal assistance;Sit to/from stand;Cueing for sequencing;Cueing for safety         General ADL Comments: pt so sweet but weak. Pt will need A upon DC with ADL activity.     Vision Patient Visual Report: No change from baseline              Pertinent Vitals/Pain Pain Assessment Pain Assessment: No/denies pain     Hand Dominance     Extremity/Trunk Assessment Upper Extremity Assessment Upper Extremity Assessment: Generalized weakness           Communication Communication Communication:  No difficulties   Cognition Arousal/Alertness: Awake/alert Behavior During Therapy: WFL for tasks assessed/performed Overall Cognitive Status: Within Functional Limits for tasks assessed                                 General Comments: some decreased memory per family but pt on point with OT this day                Home Living Family/patient expects to be discharged to:: Private residence      Type of Home: Independent living facility       Home Layout: One level     Bathroom Shower/Tub: Occupational psychologist: Standard     Home Equipment: Radio producer - single point          Prior Functioning/Environment Prior Level of Function : Independent/Modified Independent                        OT Problem List: Decreased strength;Decreased activity tolerance;Decreased safety awareness      OT Treatment/Interventions: Self-care/ADL training;DME and/or AE instruction    OT Goals(Current goals can be found in the care plan section) Acute Rehab OT Goals Patient Stated Goal: back to I OT Goal Formulation: With patient Time For Goal Achievement: 11/05/22 Potential to Achieve Goals: Good  OT Frequency: Min 2X/week       AM-PAC OT "6 Clicks" Daily Activity     Outcome Measure Help from another person eating meals?: A Little Help from another person taking care of personal grooming?: A Little Help from another person toileting, which includes using toliet, bedpan, or urinal?: A Little Help from another person bathing (including washing, rinsing, drying)?: A Little Help from another person to put on and taking off regular upper body clothing?: A Little Help from another person to put on and taking off regular lower body clothing?: A Little 6 Click Score: 18   End of Session Equipment Utilized During Treatment: Gait belt Nurse Communication: Mobility status  Activity Tolerance: Patient tolerated treatment well Patient left: in chair;with call bell/phone within reach;with nursing/sitter in room  OT Visit Diagnosis: Unsteadiness on feet (R26.81)                Time: 2162-4469 OT Time Calculation (min): 43 min Charges:  OT General Charges $OT Visit: 1 Visit OT Evaluation $OT Eval Low Complexity: 1 Low OT Treatments $Self Care/Home Management : 8-22 mins  Kari Baars, OT Acute Rehabilitation Services [phone] Office- [place], Edwena Felty D 10/22/2022, 1:13 PM

## 2022-10-22 NOTE — Progress Notes (Signed)
Initial Nutrition Assessment  DOCUMENTATION CODES:   Severe malnutrition in context of chronic illness, Underweight  INTERVENTION:   -Boost Breeze po TID, each supplement provides 250 kcal and 9 grams of protein  -Multivitamin with minerals daily  -Checking Vitamin D, B12 and folate labs given daily alcohol use reported.  -Liberalize diet to regular given severe malnutrition.   NUTRITION DIAGNOSIS:   Severe Malnutrition related to chronic illness (dementia) as evidenced by severe fat depletion, severe muscle depletion, energy intake < or equal to 50% for > or equal to 1 month.  GOAL:   Patient will meet greater than or equal to 90% of their needs  MONITOR:   PO intake, Supplement acceptance, Weight trends, Labs, I & O's  REASON FOR ASSESSMENT:   Consult Assessment of nutrition requirement/status  ASSESSMENT:   87 y.o. female with medical history significant of dementia, CAD, HLD. Presenting with shortness of breath and cough. She has had symptoms for the last week. She was seen by her PCP and diagnosed with PNA.  Patient in room, sitter and daughter at bedside. Pt states she has continued cough but she insists she has no issues with eating and appetite. States she "picks" throughout the day. Per daughter, pt does not eat well and drinks wine every night. Did not see this documented in chart. Pt does not take vitamins but has in the past. She is willing to try Boost Breeze, does not want milky supplements. Will add daily MVI. Will check labs of Vitamin D, B-12 and folate given daily alcohol use.  Will liberalize pt's diet to regular given pt doesn't eat well and doesn't need a fat or sodium restriction.  Per pt's daughter, pt has lost 10 lbs over the past year. Pt denies losing weight.  Per weight records, pt has lost 19 lbs since January 2022.  Medications reviewed.  Labs reviewed.  NUTRITION - FOCUSED PHYSICAL EXAM:  Flowsheet Row Most Recent Value  Orbital Region  Severe depletion  Upper Arm Region Severe depletion  Thoracic and Lumbar Region Severe depletion  Buccal Region Severe depletion  Temple Region Severe depletion  Clavicle Bone Region Severe depletion  Clavicle and Acromion Bone Region Severe depletion  Scapular Bone Region Severe depletion  Dorsal Hand Severe depletion  Patellar Region Severe depletion  Anterior Thigh Region Severe depletion  Posterior Calf Region Severe depletion  Edema (RD Assessment) None  Hair Reviewed  Eyes Reviewed  Mouth Reviewed  Skin Reviewed       Diet Order:   Diet Order             Diet regular Room service appropriate? Yes; Fluid consistency: Thin  Diet effective now                   EDUCATION NEEDS:   Education needs have been addressed  Skin:  Skin Assessment: Reviewed RN Assessment  Last BM:  1/8  Height:   Ht Readings from Last 1 Encounters:  10/20/22 '5\' 6"'$  (1.676 m)    Weight:   Wt Readings from Last 1 Encounters:  10/20/22 44.9 kg    BMI:  Body mass index is 15.98 kg/m.  Estimated Nutritional Needs:   Kcal:  1700-1900  Protein:  80-90g  Fluid:  1.7L/day   Clayton Bibles, MS, RD, LDN Inpatient Clinical Dietitian Contact information available via Amion

## 2022-10-22 NOTE — Evaluation (Addendum)
Physical Therapy Evaluation Patient Details Name: Michele Burns MRN: [id number] DOB: 1936-01-26 Today's Date: 10/22/2022  History of Present Illness  Michele Burns is a 87 y.o. female with medical history significant of dementia, CAD, HLD. Presenting 10/20/22 with shortness of breath and cough. . She was seen by her PCP and diagnosed with PNA and treated but has worsening symptoms and sent to ED. CXR-Bilateral pleural effusions and opacities.  Clinical Impression  Pt admitted with above diagnosis.  Pt currently with functional limitations due to the deficits listed below (see PT Problem List). Pt will benefit from skilled PT to increase their independence and safety with mobility to allow discharge to the venue listed below.     The patient is pleasantly confused. Patient  oriented to self and 'an Institution."  No family present.    Checked SPO2 on Ra- Pleth not reading well, / 875. On 2 L- 95%. Ambulated with patient  on 2 LPM  using RW, Spo2 95%, Dyspnea 3/4.  Continue  mobility and /assess needs at DC. ? Patient is from [place].      Recommendations for follow up therapy are one component of a multi-disciplinary discharge planning process, led by the attending physician.  Recommendations may be updated based on patient status, additional functional criteria and insurance authorization.  Follow Up Recommendations Skilled nursing-short term rehab (<3 hours/day) Can patient physically be transported by private vehicle: Yes    Assistance Recommended at Discharge Frequent or constant Supervision/Assistance  Patient can return home with the following  A little help with walking and/or transfers;A little help with bathing/dressing/bathroom;Assist for transportation    Equipment Recommendations None recommended by PT  Recommendations for Other Services       Functional Status Assessment Patient has had a recent decline in their functional status and demonstrates the ability to make  significant improvements in function in a reasonable and predictable amount of time.     Precautions / Restrictions Precautions Precautions: Fall Precaution Comments: watch oxygen      Mobility  Bed Mobility Overal bed mobility: Independent                  Transfers Overall transfer level: Needs assistance Equipment used: Rolling walker (2 wheels) Transfers: Sit to/from Stand Sit to Stand: Min guard                Ambulation/Gait Ambulation/Gait assistance: Min Web designer (Feet): 60 Feet Assistive device: Rolling walker (2 wheels) Gait Pattern/deviations: Step-through pattern, Drifts right/left Gait velocity: decr     General Gait Details: steady assistance for turns and  around objects in room  Stairs            Wheelchair Mobility    Modified Rankin (Stroke Patients Only)       Balance   Sitting-balance support: Single extremity supported Sitting balance-Leahy Scale: Good     Standing balance support: No upper extremity supported, During functional activity, Reliant on assistive device for balance                                 Pertinent Vitals/Pain Pain Assessment Pain Assessment: No/denies pain    Home Living Family/patient expects to be discharged to:: Private residence     Type of Home: Independent living facility         Home Layout: One level Home Equipment: Kasandra Knudsen - single point Additional Comments: no family present for confirmation  Prior Function Prior Level of Function : Independent/Modified Independent                     Hand Dominance        Extremity/Trunk Assessment   Upper Extremity Assessment Upper Extremity Assessment: Generalized weakness    Lower Extremity Assessment Lower Extremity Assessment: Generalized weakness    Cervical / Trunk Assessment Cervical / Trunk Assessment: Normal  Communication   Communication: No difficulties  Cognition Arousal/Alertness:  Awake/alert Behavior During Therapy: WFL for tasks assessed/performed Overall Cognitive Status: Impaired/Different from baseline Area of Impairment: Orientation, Attention, Memory, Awareness                 Orientation Level: Place, Time, Situation Current Attention Level: Selective Memory: Decreased short-term memory     Awareness: Emergent            General Comments      Exercises     Assessment/Plan    PT Assessment Patient needs continued PT services  PT Problem List Decreased strength;Decreased cognition;Decreased balance;Decreased mobility;Decreased knowledge of use of DME;Decreased activity tolerance       PT Treatment Interventions DME instruction;Functional mobility training;Patient/family education;Balance training;Therapeutic activities;Therapeutic exercise;Cognitive remediation    PT Goals (Current goals can be found in the Care Plan section)  Acute Rehab PT Goals PT Goal Formulation: Patient unable to participate in goal setting Time For Goal Achievement: 11/05/22 Potential to Achieve Goals: Good    Frequency Min 2X/week     Co-evaluation               AM-PAC PT "6 Clicks" Mobility  Outcome Measure Help needed turning from your back to your side while in a flat bed without using bedrails?: A Little Help needed moving from lying on your back to sitting on the side of a flat bed without using bedrails?: A Little Help needed moving to and from a bed to a chair (including a wheelchair)?: A Little Help needed standing up from a chair using your arms (e.g., wheelchair or bedside chair)?: A Little Help needed to walk in hospital room?: A Little Help needed climbing 3-5 steps with a railing? : A Lot 6 Click Score: 17    End of Session Equipment Utilized During Treatment: Gait belt;Oxygen Activity Tolerance: Patient tolerated treatment well Patient left: in chair;with nursing/sitter in room;with call bell/phone within reach Nurse  Communication: Mobility status PT Visit Diagnosis: Unsteadiness on feet (R26.81);Difficulty in walking, not elsewhere classified (R26.2)    Time: 1610-9604 PT Time Calculation (min) (ACUTE ONLY): 25 min   Charges:   PT Evaluation $PT Eval Low Complexity: 1 Low PT Treatments $Gait Training: 8-22 mins        [place] Office [phone] Weekend [phone]   Claretha Cooper 10/22/2022, 4:24 PM

## 2022-10-22 NOTE — Progress Notes (Signed)
Occupational Therapy Treatment Patient Details Name: Michele Burns MRN: [id number] DOB: 12-09-1935 Today's Date: 10/22/2022   History of present illness Michele Burns is a 87 y.o. female with medical history significant of dementia, CAD, HLD. Presenting with shortness of breath and cough. She has had symptoms for the last week. She was seen by her PCP and diagnosed with PNA. She was given a course of abx; however, after completeing them, she is not feeling better. She went to her PCP for follow up today, and it was noted that her PNA was worse   OT comments  Educated pt om importance of OOB.  Pt was back in bed but wanted to get OOB when OT arrived   Recommendations for follow up therapy are one component of a multi-disciplinary discharge planning process, led by the attending physician.  Recommendations may be updated based on patient status, additional functional criteria and insurance authorization.    Follow Up Recommendations  Skilled nursing-short term rehab (<3 hours/day)     Assistance Recommended at Discharge Intermittent Supervision/Assistance  Patient can return home with the following  A little help with walking and/or transfers;A little help with bathing/dressing/bathroom;Assistance with cooking/housework   Equipment Recommendations  BSC/3in1    Recommendations for Other Services      Precautions / Restrictions Precautions Precautions: Fall Precaution Comments: watch oxygen       Mobility Bed Mobility Overal bed mobility: Independent                  Transfers Overall transfer level: Needs assistance Equipment used: 1 person hand held assist Transfers: Sit to/from Stand Sit to Stand: Min assist                 Balance Overall balance assessment: Needs assistance Sitting-balance support: Single extremity supported                                       ADL either performed or assessed with clinical judgement   ADL  Overall ADL's : Needs assistance/impaired Eating/Feeding: Minimal assistance   Grooming: Wash/dry face;Cueing for safety;Minimal assistance   Upper Body Bathing: Minimal assistance;Sitting   Lower Body Bathing: Minimal assistance;Sit to/from stand;Cueing for safety;Cueing for sequencing   Upper Body Dressing : Minimal assistance;Sitting   Lower Body Dressing: Minimal assistance;Sit to/from stand;Cueing for safety;Cueing for sequencing   Toilet Transfer: Minimal assistance;Cueing for safety;Cueing for sequencing;Stand-pivot   Toileting- Clothing Manipulation and Hygiene: Minimal assistance;Sit to/from stand;Cueing for sequencing;Cueing for safety         General ADL Comments: Pt sat EOB and instructed in inventive spirometer.  Pt very limited but did cough.  Encouraged pt to stay OOB    Extremity/Trunk Assessment Upper Extremity Assessment Upper Extremity Assessment: Generalized weakness            Vision Patient Visual Report: No change from baseline            Cognition Arousal/Alertness: Awake/alert Behavior During Therapy: WFL for tasks assessed/performed Overall Cognitive Status: Within Functional Limits for tasks assessed                                 General Comments: some decreased memory per family but pt on point with OT this day  Pertinent Vitals/ Pain       Pain Assessment Pain Assessment: No/denies pain  Home Living Family/patient expects to be discharged to:: Private residence     Type of Home: Independent living facility       Home Layout: One level     Bathroom Shower/Tub: Occupational psychologist: Standard     Home Equipment: Radio producer - single point          Prior Functioning/Environment              Frequency  Min 2X/week        Progress Toward Goals  OT Goals(current goals can now be found in the care plan section)  Progress towards OT goals: Progressing toward  goals  Acute Rehab OT Goals Patient Stated Goal: back to I OT Goal Formulation: With patient Time For Goal Achievement: 11/05/22 Potential to Achieve Goals: Good ADL Goals Pt Will Perform Grooming: with modified independence;standing Pt Will Perform Lower Body Bathing: with modified independence;sit to/from stand Pt Will Perform Lower Body Dressing: with modified independence;sit to/from stand Pt Will Transfer to Toilet: with modified independence;ambulating Pt Will Perform Toileting - Clothing Manipulation and hygiene: with modified independence;sit to/from stand  Plan      Co-evaluation                 AM-PAC OT "6 Clicks" Daily Activity     Outcome Measure   Help from another person eating meals?: A Little Help from another person taking care of personal grooming?: A Little Help from another person toileting, which includes using toliet, bedpan, or urinal?: A Little Help from another person bathing (including washing, rinsing, drying)?: A Little Help from another person to put on and taking off regular upper body clothing?: A Little Help from another person to put on and taking off regular lower body clothing?: A Little 6 Click Score: 18    End of Session Equipment Utilized During Treatment: Gait belt  OT Visit Diagnosis: Unsteadiness on feet (R26.81)   Activity Tolerance Patient tolerated treatment well   Patient Left in chair;with call bell/phone within reach;with nursing/sitter in room   Nurse Communication Mobility status        Time: 1210-1230 OT Time Calculation (min): 20 min  Charges: OT General Charges $OT Visit: 1 Visit OT Evaluation $OT Eval Low Complexity: 1 Low OT Treatments $Self Care/Home Management : 8-22 mins    Cylas Falzone, Thereasa Parkin 10/22/2022, 1:21 PM

## 2022-10-23 ENCOUNTER — Inpatient Hospital Stay (HOSPITAL_COMMUNITY): Payer: Medicare Other

## 2022-10-23 DIAGNOSIS — F0394 Unspecified dementia, unspecified severity, with anxiety: Secondary | ICD-10-CM | POA: Diagnosis not present

## 2022-10-23 DIAGNOSIS — J189 Pneumonia, unspecified organism: Secondary | ICD-10-CM | POA: Diagnosis not present

## 2022-10-23 LAB — GRAM STAIN

## 2022-10-23 LAB — PROTEIN, PLEURAL OR PERITONEAL FLUID: Total protein, fluid: 3.5 g/dL

## 2022-10-23 LAB — ALBUMIN, PLEURAL OR PERITONEAL FLUID: Albumin, Fluid: 2.2 g/dL

## 2022-10-23 LAB — LACTATE DEHYDROGENASE, PLEURAL OR PERITONEAL FLUID: LD, Fluid: 164 U/L — ABNORMAL HIGH (ref 3–23)

## 2022-10-23 MED ORDER — TRAZODONE HCL 100 MG PO TABS
100.0000 mg | ORAL_TABLET | Freq: Every evening | ORAL | Status: DC | PRN
  Administered 2022-10-24: 100 mg via ORAL
  Filled 2022-10-23: qty 1

## 2022-10-23 MED ORDER — LIDOCAINE HCL 1 % IJ SOLN
INTRAMUSCULAR | Status: AC
  Administered 2022-10-23: 10 mL
  Filled 2022-10-23: qty 20

## 2022-10-23 NOTE — Progress Notes (Signed)
Mobility Specialist - Progress Note  Pre-mobility: 90% SpO2 During mobility: 87% SpO2 Post-mobility: 91% SPO2   10/23/22 0906  Oxygen Therapy  O2 Device Nasal Cannula  O2 Flow Rate (L/min) 2 L/min  Mobility  Activity Stood at bedside  Level of Assistance Contact guard assist, steadying assist  Assistive Device Other (Comment) (rollator)  Activity Response Tolerated well  Mobility Referral Yes  $Mobility charge 1 Mobility   Pt was found in bed and agreeable to try ambulation. Pt had a hoarse voice and frustrated as to why she feels worse today than yesterday. Was coughing throughout session and once sitting EOB stated that she did not feel she could walk. She was able to stand but immediately wanted to sit back down and was saying she couldn't breath. At EOS was left in bed with all necessities in reach and bed alarm on.  Ferd Hibbs Mobility Specialist

## 2022-10-23 NOTE — NC FL2 (Signed)
[place] LEVEL OF CARE FORM     IDENTIFICATION  Patient Name: Michele Burns Birthdate: June 21, 1936 Sex: female Admission Date (Current Location): 10/20/2022  [facility] and [place] Number:  Herbalist and Address:  [facility],  [address] [place], [place]      Provider Number: [id number]  Attending Physician Name and Address:  Hosie Poisson, MD  Relative Name and Phone Number:  Mikaela Hilgeman (son) [phone]    Current Level of Care: Hospital Recommended Level of Care: [place] Prior Approval Number:    Date Approved/Denied:   PASRR Number: [id number]  Discharge Plan: SNF    Current Diagnoses: Patient Active Problem List   Diagnosis Date Noted   Protein-calorie malnutrition, severe 10/22/2022   CAP (community acquired pneumonia) 10/20/2022   Dementia ([place]) 10/20/2022   Chronic insomnia 01/21/2015   Low serum vitamin B12 12/06/2014   Anemia 12/03/2014   Tremor    Atherosclerosis of native arteries of the extremities with ulceration(440.23) 01/02/2014   CAD (coronary artery disease) 11/18/2012   Depression 12/25/2010   IRRITABLE BOWEL SYNDROME 10/08/2010   PULMONARY NODULE 08/14/2010   PVD 12/27/2009   MIGRAINE HEADACHE 11/12/2009   RENAL CYST 11/12/2009   CARDIAC MURMUR 09/23/2009   Hyperlipidemia 09/20/2009   Essential hypertension 09/20/2009    Orientation RESPIRATION BLADDER Height & Weight     Self  Normal Continent Weight: 44.9 kg Height:  '5\' 6"'$  (167.6 cm)  BEHAVIORAL SYMPTOMS/MOOD NEUROLOGICAL BOWEL NUTRITION STATUS      Continent Diet (regular)  AMBULATORY STATUS COMMUNICATION OF NEEDS Skin   Limited Assist Verbally Other (Comment) (ecchymosis to bilateral legs; skin dry and flaky)                       Personal Care Assistance Level of Assistance  Bathing, Feeding, Dressing Bathing Assistance: Limited assistance Feeding assistance: Limited assistance Dressing Assistance:  Limited assistance     Functional Limitations Info  Sight, Hearing, Speech Sight Info: Impaired (glasses) Hearing Info: Adequate Speech Info: Adequate    SPECIAL CARE FACTORS FREQUENCY  PT (By licensed PT), OT (By licensed OT)     PT Frequency: 5x/week OT Frequency: 5x/week            Contractures Contractures Info: Not present    Additional Factors Info  Code Status, Allergies Code Status Info: DNR Allergies Info: codeine, cortisone           Current Medications (10/23/2022):  This is the current hospital active medication list Current Facility-Administered Medications  Medication Dose Route Frequency Provider Last Rate Last Admin   acetaminophen (TYLENOL) tablet 650 mg  650 mg Oral Q6H PRN Marylyn Ishihara, Tyrone A, DO       Or   acetaminophen (TYLENOL) suppository 650 mg  650 mg Rectal Q6H PRN Marylyn Ishihara, Tyrone A, DO       albuterol (PROVENTIL) (2.5 MG/3ML) 0.083% nebulizer solution 2.5 mg  2.5 mg Nebulization Q2H PRN Marylyn Ishihara, Tyrone A, DO       amoxicillin-clavulanate (AUGMENTIN) 875-125 MG per tablet 1 tablet  1 tablet Oral Q12H Hosie Poisson, MD   1 tablet at 10/23/22 8502   feeding supplement (BOOST / RESOURCE BREEZE) liquid 1 Container  1 Container Oral TID BM Hosie Poisson, MD       guaiFENesin (MUCINEX) 12 hr tablet 600 mg  600 mg Oral BID Marylyn Ishihara, Tyrone A, DO   600 mg at 10/23/22 7741   multivitamin with minerals tablet  1 tablet  1 tablet Oral Daily Hosie Poisson, MD   1 tablet at 10/23/22 0221     Discharge Medications: Please see discharge summary for a list of discharge medications.  Relevant Imaging Results:  Relevant Lab Results:   Additional Information SSN [id number]  Henrietta Dine, RN

## 2022-10-23 NOTE — Progress Notes (Signed)
[facility]                                                                               Michele Burns, is a 87 y.o. female, DOB - [birth year], [id number] Admit date - 10/20/2022    Outpatient Primary MD for the patient is [name], MD  LOS - 2  days    Brief summary   Michele Burns is a 87 y.o. female with medical history significant of dementia, CAD, HLD. Presenting with shortness of breath and cough. She has had symptoms for the last week. She was seen by her PCP and diagnosed with PNA. She was given a course of abx; however, after completeing them, she is not feeling better. She was admitted for further evaluation of the CAP.  She was started on IV antibiotics and transitioned to oral antibiotics.    Assessment & Plan    Assessment and Plan:  Community Acquired pneumonia:  She was started on IV Ceftriaxone and IV Azithromycin, transitioned to oral antibiotics.  Currently she is requiring up to 3 L of nasal cannula oxygen. Slowly wean her off oxygen as tolerated.  Continue with cough medication and nebulizers as needed. Unable to wean her off oxygen. Repeat CXR shows bilateral effusions. Will get US thoracentesis    Dementia No agitation at this time.     Hyperlipidemia -Statin on hold at this time.   Therapy eval recommending SNF.    Protein calorie malnutrition/ severe malnutrition.    Estimated body mass index is 15.98 kg/m as calculated from the following:   Height as of this encounter: '5\' 6"'$  (1.676 m).   Weight as of this encounter: 44.9 kg.  Code Status: DNR.  DVT Prophylaxis:  SCDs Start: 10/20/22 2037   Level of Care: Level of care: Med-Surg Family Communication: daughter at bedside.   Disposition Plan:     Remains inpatient appropriate:  IV antibiotics.  Procedures:  None.  Consultants:   None.   Antimicrobials:   Anti-infectives (From admission, onward)    Start     Dose/Rate Route Frequency Ordered Stop    10/22/22 2200  amoxicillin-clavulanate (AUGMENTIN) 875-125 MG per tablet 1 tablet        1 tablet Oral Every 12 hours 10/22/22 1613     10/20/22 1445  azithromycin (ZITHROMAX) 500 mg in sodium chloride 0.9 % 250 mL IVPB  Status:  Discontinued        500 mg 250 mL/hr over 60 Minutes Intravenous Every 24 hours 10/20/22 1432 10/22/22 1613   10/20/22 1445  cefTRIAXone (ROCEPHIN) 1 g in sodium chloride 0.9 % 100 mL IVPB  Status:  Discontinued        1 g 200 mL/hr over 30 Minutes Intravenous Every 24 hours 10/20/22 1432 10/22/22 1613        Medications  Scheduled Meds:  amoxicillin-clavulanate  1 tablet Oral Q12H   feeding supplement  1 Container Oral TID BM   guaiFENesin  600 mg Oral BID   multivitamin with minerals  1 tablet Oral Daily   Continuous Infusions:  sodium chloride 75 mL/hr at 10/21/22 1808   PRN Meds:.acetaminophen **OR** acetaminophen, albuterol  Subjective:   Michele Burns was seen and examined today.  Still requiring 2 lit of oxygen. Still coughing and dyspneic.   Objective:   Vitals:   10/22/22 1249 10/22/22 1318 10/22/22 2052 10/23/22 0454  BP: 134/81  131/85 (!) 136/95  Pulse: 65  66 68  Resp: '16  16 20  '$ Temp: (!) 97.5 F (36.4 C)  97.9 F (36.6 C) (!) 97.4 F (36.3 C)  TempSrc: Oral  Oral Oral  SpO2: 98% 94% 94% 95%  Weight:      Height:        Intake/Output Summary (Last 24 hours) at 10/23/2022 1255 Last data filed at 10/23/2022 1000 Gross per 24 hour  Intake 510 ml  Output 0 ml  Net 510 ml    Filed Weights   10/20/22 1445 10/20/22 2135  Weight: 53.2 kg 44.9 kg     Exam General exam: Appears calm and comfortable  Respiratory system: diminished air entry at bases.  Cardiovascular system: S1 & S2 heard, RRR. No JVD, Gastrointestinal system: Abdomen is nondistended, soft and nontender. Central nervous system: Alert and oriented. No focal neurological deficits. Extremities: Symmetric 5 x 5 power. Skin: No rashes, lesions or  ulcers Psychiatry:  Mood & affect appropriate.      Data Reviewed:  I have personally reviewed following labs and imaging studies   CBC Lab Results  Component Value Date   WBC 9.3 10/21/2022   RBC 3.81 (L) 10/21/2022   HGB 11.2 (L) 10/21/2022   HCT 34.9 (L) 10/21/2022   MCV 91.6 10/21/2022   MCH 29.4 10/21/2022   PLT 192 10/21/2022   MCHC 32.1 10/21/2022   RDW 13.9 10/21/2022   LYMPHSABS 0.9 10/20/2022   MONOABS 0.7 10/20/2022   EOSABS 0.3 10/20/2022   BASOSABS 0.1 49/70/2637     Last metabolic panel Lab Results  Component Value Date   NA 138 10/21/2022   K 3.8 10/21/2022   CL 107 10/21/2022   CO2 21 (L) 10/21/2022   BUN 22 10/21/2022   CREATININE 0.95 10/21/2022   GLUCOSE 85 10/21/2022   GFRNONAA 59 (L) 10/21/2022   GFRAA 60 08/26/2020   CALCIUM 8.5 (L) 10/21/2022   PROT 5.4 (L) 10/21/2022   ALBUMIN 2.4 (L) 10/21/2022   BILITOT 0.8 10/21/2022   ALKPHOS 59 10/21/2022   AST 17 10/21/2022   ALT 12 10/21/2022   ANIONGAP 10 10/21/2022    CBG (last 3)  No results for input(s): "GLUCAP" in the last 72 hours.    Coagulation Profile: Recent Labs  Lab 10/20/22 1519  INR 1.1      Radiology Studies: No results found.     Hosie Poisson M.D. [facility] 10/23/2022, 12:55 PM  Available via Epic secure chat 7am-7pm After 7 pm, please refer to night coverage provider listed on amion.

## 2022-10-23 NOTE — Procedures (Signed)
PROCEDURE SUMMARY:  Successful US guided right thoracentesis. Yielded 900 mL of yellow, clear fluid. Pt tolerated procedure well. No immediate complications.  Specimen was sent for labs. CXR ordered.  EBL < 5 mL  Docia Barrier PA-C 10/23/2022 3:49 PM

## 2022-10-23 NOTE — TOC Initial Note (Addendum)
Transition of Care [facility]) - Initial/Assessment Note    Patient Details  Name: Michele Burns MRN: [id number] Date of Birth: [birth year]  Transition of [facility]) CM/SW Contact:    Henrietta Dine, RN Phone Number: 10/23/2022, 9:35 AM  Clinical Narrative:                 [facility] consult for SNF; pt oriented x 1 to self; contacted pt's son Carolie Mcilrath ([phone]); he is in agrees to SNF; he says the pt lives at [place], [place], [place]; he says the pt does not have IPV, food insecurity,  or problems paying utilities; Ns Mchaney says his mother wears glasses; she does not have dentures, HA; he says she does have a shower chair but not other DME or oxygen; she does not receive [place] services; explained SNF placement process to pt's son; confirmed pt's level of care w/ Langley Gauss, Community education officer at [place]; she says th pt is from [place]; pt not medically ready for d/c; will start FL2; complete when medically stable and fax out for bed offers; insurance auth; TOC will follow. Expected Discharge Plan: Skilled Nursing Facility Barriers to Discharge: Continued Medical Work up   Patient Goals and CMS Choice Patient states their goals for this hospitalization and ongoing recovery are:: SNF per Pandora Leiter, Garnette Gunner          Expected Discharge Plan and Services   Discharge Planning Services: CM Consult   Living arrangements for the past 2 months: [place] ([place], [place])                                      Prior Living Arrangements/Services Living arrangements for the past 2 months: [place] ([place], [place]) Lives with:: Facility Resident Patient language and need for interpreter reviewed:: Yes Do you feel safe going back to the place where you live?: Yes      Need for Family Participation in Patient Care: Yes (Comment) Care giver support system in place?: Yes (comment)   Criminal Activity/Legal Involvement Pertinent to Current  Situation/Hospitalization: No - Comment as needed  Activities of Daily Living Home Assistive Devices/Equipment: Eyeglasses ADL Screening (condition at time of admission) Patient's cognitive ability adequate to safely complete daily activities?: No Is the patient deaf or have difficulty hearing?: No Does the patient have difficulty seeing, even when wearing glasses/contacts?: No Does the patient have difficulty concentrating, remembering, or making decisions?: Yes Patient able to express need for assistance with ADLs?: No Does the patient have difficulty dressing or bathing?: No Independently performs ADLs?: Yes (appropriate for developmental age) Does the patient have difficulty walking or climbing stairs?: No Weakness of Legs: None Weakness of Arms/Hands: None  Permission Sought/Granted Permission sought to share information with : Case Manager Permission granted to share information with : Yes, Verbal Permission Granted  Share Information with NAME: Lenor Coffin, RN, CM           Emotional Assessment       Orientation: : Oriented to Self Alcohol / Substance Use: Not Applicable Psych Involvement: No (comment)  Admission diagnosis:  CAP (community acquired pneumonia) [J18.9] Community acquired pneumonia, unspecified laterality [J18.9] Patient Active Problem List   Diagnosis Date Noted   Protein-calorie malnutrition, severe 10/22/2022   CAP (community acquired pneumonia) 10/20/2022   Dementia ([place]) 10/20/2022   Chronic insomnia 01/21/2015   Low serum vitamin B12 12/06/2014   Anemia 12/03/2014  Tremor    Atherosclerosis of native arteries of the extremities with ulceration(440.23) 01/02/2014   CAD (coronary artery disease) 11/18/2012   Depression 12/25/2010   IRRITABLE BOWEL SYNDROME 10/08/2010   PULMONARY NODULE 08/14/2010   PVD 12/27/2009   MIGRAINE HEADACHE 11/12/2009   RENAL CYST 11/12/2009   CARDIAC MURMUR 09/23/2009   Hyperlipidemia 09/20/2009   Essential  hypertension 09/20/2009   PCP:  [name], MD Pharmacy:   [facility] [place] [place] [address] [place] [address] Phone: [phone] Fax: [phone]     Social Determinants of Health (SDOH) Social History: SDOH Screenings   Tobacco Use: Medium Risk (10/20/2022)   SDOH Interventions:     Readmission Risk Interventions     No data to display

## 2022-10-24 ENCOUNTER — Inpatient Hospital Stay (HOSPITAL_COMMUNITY): Payer: Medicare Other

## 2022-10-24 DIAGNOSIS — E785 Hyperlipidemia, unspecified: Secondary | ICD-10-CM | POA: Diagnosis not present

## 2022-10-24 DIAGNOSIS — E43 Unspecified severe protein-calorie malnutrition: Secondary | ICD-10-CM | POA: Diagnosis not present

## 2022-10-24 DIAGNOSIS — F0394 Unspecified dementia, unspecified severity, with anxiety: Secondary | ICD-10-CM | POA: Diagnosis not present

## 2022-10-24 DIAGNOSIS — R0602 Shortness of breath: Secondary | ICD-10-CM

## 2022-10-24 DIAGNOSIS — J189 Pneumonia, unspecified organism: Secondary | ICD-10-CM | POA: Diagnosis not present

## 2022-10-24 LAB — CBC WITH DIFFERENTIAL/PLATELET
Abs Immature Granulocytes: 0.04 10*3/uL (ref 0.00–0.07)
Basophils Absolute: 0.1 10*3/uL (ref 0.0–0.1)
Basophils Relative: 1 %
Eosinophils Absolute: 0.7 10*3/uL — ABNORMAL HIGH (ref 0.0–0.5)
Eosinophils Relative: 7 %
HCT: 40.4 % (ref 36.0–46.0)
Hemoglobin: 13.3 g/dL (ref 12.0–15.0)
Immature Granulocytes: 0 %
Lymphocytes Relative: 11 %
Lymphs Abs: 1 10*3/uL (ref 0.7–4.0)
MCH: 29.8 pg (ref 26.0–34.0)
MCHC: 32.9 g/dL (ref 30.0–36.0)
MCV: 90.6 fL (ref 80.0–100.0)
Monocytes Absolute: 0.7 10*3/uL (ref 0.1–1.0)
Monocytes Relative: 8 %
Neutro Abs: 7.2 10*3/uL (ref 1.7–7.7)
Neutrophils Relative %: 73 %
Platelets: 202 10*3/uL (ref 150–400)
RBC: 4.46 MIL/uL (ref 3.87–5.11)
RDW: 14.1 % (ref 11.5–15.5)
WBC: 9.7 10*3/uL (ref 4.0–10.5)
nRBC: 0 % (ref 0.0–0.2)

## 2022-10-24 LAB — ECHOCARDIOGRAM COMPLETE
Area-P 1/2: 5.38 cm2
Calc EF: 32 %
Height: 66 in
S' Lateral: 2.4 cm
Single Plane A2C EF: 27.9 %
Single Plane A4C EF: 36.8 %
Weight: 1583.78 oz

## 2022-10-24 LAB — BASIC METABOLIC PANEL
Anion gap: 11 (ref 5–15)
BUN: 24 mg/dL — ABNORMAL HIGH (ref 8–23)
CO2: 23 mmol/L (ref 22–32)
Calcium: 8.3 mg/dL — ABNORMAL LOW (ref 8.9–10.3)
Chloride: 110 mmol/L (ref 98–111)
Creatinine, Ser: 0.66 mg/dL (ref 0.44–1.00)
GFR, Estimated: >60 mL/min (ref >60)
Glucose, Bld: 103 mg/dL — ABNORMAL HIGH (ref 70–99)
Potassium: 3.4 mmol/L — ABNORMAL LOW (ref 3.5–5.1)
Sodium: 144 mmol/L (ref 135–145)

## 2022-10-24 LAB — MAGNESIUM: Magnesium: 2 mg/dL (ref 1.7–2.4)

## 2022-10-24 LAB — LACTATE DEHYDROGENASE: LDH: 141 U/L (ref 98–192)

## 2022-10-24 MED ORDER — FOLIC ACID 1 MG PO TABS
1.0000 mg | ORAL_TABLET | Freq: Every day | ORAL | Status: DC
  Administered 2022-10-24: 1 mg via ORAL
  Filled 2022-10-24 (×3): qty 1

## 2022-10-24 MED ORDER — FUROSEMIDE 20 MG PO TABS
20.0000 mg | ORAL_TABLET | Freq: Every day | ORAL | Status: DC
  Administered 2022-10-24: 20 mg via ORAL
  Filled 2022-10-24: qty 1

## 2022-10-24 MED ORDER — SENNOSIDES-DOCUSATE SODIUM 8.6-50 MG PO TABS
2.0000 | ORAL_TABLET | Freq: Two times a day (BID) | ORAL | Status: DC
  Administered 2022-10-24 (×2): 2 via ORAL
  Filled 2022-10-24 (×2): qty 2

## 2022-10-24 MED ORDER — CYANOCOBALAMIN 1000 MCG/ML IJ SOLN
1000.0000 ug | Freq: Every day | INTRAMUSCULAR | Status: DC
  Administered 2022-10-24: 1000 ug via INTRAMUSCULAR
  Filled 2022-10-24 (×2): qty 1

## 2022-10-24 MED ORDER — POLYETHYLENE GLYCOL 3350 17 G PO PACK
17.0000 g | PACK | Freq: Every day | ORAL | Status: DC
  Administered 2022-10-24: 17 g via ORAL
  Filled 2022-10-24 (×2): qty 1

## 2022-10-24 MED ORDER — POTASSIUM CHLORIDE CRYS ER 20 MEQ PO TBCR
40.0000 meq | EXTENDED_RELEASE_TABLET | Freq: Once | ORAL | Status: AC
  Administered 2022-10-24: 40 meq via ORAL
  Filled 2022-10-24: qty 2

## 2022-10-24 NOTE — Progress Notes (Signed)
[facility]                                                                               Michele Burns, is a 87 y.o. female, DOB - Aug 17, 1936, PIR:[id number] Admit date - 10/20/2022    Outpatient Primary MD for the patient is Michele Hatchet, MD  LOS - 3  days    Brief summary   Michele Burns is a 87 y.o. female with medical history significant of dementia, CAD, HLD. Presenting with shortness of breath and cough. She has had symptoms for the last week. She was seen by her PCP and diagnosed with PNA. She was given a course of abx; however, after completeing them, she is not feeling better. She was admitted for further evaluation of the CAP.  She was started on IV antibiotics and transitioned to oral antibiotics.    Assessment & Plan    Assessment and Plan:  Community Acquired pneumonia:  She was started on IV Ceftriaxone and IV Azithromycin, transitioned to oral antibiotics.  Currently she is requiring up to 3 L of nasal cannula oxygen. Slowly wean her off oxygen as tolerated.  Continue with cough medication and nebulizers as needed. Unable to wean her off oxygen. Repeat CXR shows bilateral effusions.  US thoracentesis done and 900 ml of clear fluid taken out.  Pleural fluid sent for analysis so far no signs of infection at this time   Dementia Patient is restless and trying to get out of bed.  Probably sundowning. Will get safety sitter as patient is high fall risk at this time.    Newly diagnosed CHF:  LVef is about 25% to 30%. Left ventricle has severely decreased function. The left ventricle demonstrates regional wall abn. There is moderate asymmetric left ventricular hypertrophy of the basal and septal segments.  Left ventricular diastolic parameters are indeterminate. Right ventricular systolic function is normal  Continue with strict intake and output and daily weights.  Started her on Lasix 20 mg daily.    Hyperlipidemia -Statin on hold at this  time.   Therapy eval recommending SNF.    Protein calorie malnutrition/ severe malnutrition.    Estimated body mass index is 15.98 kg/m as calculated from the following:   Height as of this encounter: '5\' 6"'$  (1.676 m).   Weight as of this encounter: 44.9 kg.  Code Status: DNR.  DVT Prophylaxis:  SCDs Start: 10/20/22 2037   Level of Care: Level of care: Med-Surg Family Communication: daughter at bedside.   Disposition Plan:     Remains inpatient appropriate:  IV antibiotics.  Procedures:  None.  Consultants:   None.   Antimicrobials:   Anti-infectives (From admission, onward)    Start     Dose/Rate Route Frequency Ordered Stop   10/22/22 2200  amoxicillin-clavulanate (AUGMENTIN) 875-125 MG per tablet 1 tablet        1 tablet Oral Every 12 hours 10/22/22 1613     10/20/22 1445  azithromycin (ZITHROMAX) 500 mg in sodium chloride 0.9 % 250 mL IVPB  Status:  Discontinued        500 mg 250 mL/hr over 60 Minutes Intravenous Every 24 hours 10/20/22 1432  10/22/22 1613   10/20/22 1445  cefTRIAXone (ROCEPHIN) 1 g in sodium chloride 0.9 % 100 mL IVPB  Status:  Discontinued        1 g 200 mL/hr over 30 Minutes Intravenous Every 24 hours 10/20/22 1432 10/22/22 1613        Medications  Scheduled Meds:  amoxicillin-clavulanate  1 tablet Oral Q12H   cyanocobalamin  1,000 mcg Intramuscular Daily   feeding supplement  1 Container Oral TID BM   folic acid  1 mg Oral Daily   guaiFENesin  600 mg Oral BID   multivitamin with minerals  1 tablet Oral Daily   polyethylene glycol  17 g Oral Daily   senna-docusate  2 tablet Oral BID   Continuous Infusions:   PRN Meds:.acetaminophen **OR** acetaminophen, albuterol, traZODone    Subjective:   [name] was seen and examined today.  Still requiring 2 lit of oxygen. Still coughing and dyspneic.   Objective:   Vitals:   10/23/22 1400 10/23/22 2115 10/24/22 0521 10/24/22 1325  BP: 108/85 111/82 96/68 (!) 108/90  Pulse: 75  72 72 84  Resp: '16  19 16  '$ Temp: (!) 97.5 F (36.4 C) 98.1 F (36.7 C) 97.6 F (36.4 C) (!) 97.4 F (36.3 C)  TempSrc: Oral Oral Oral   SpO2: 96% 92% 96% 100%  Weight:      Height:        Intake/Output Summary (Last 24 hours) at 10/24/2022 1635 Last data filed at 10/24/2022 1400 Gross per 24 hour  Intake 590 ml  Output 0 ml  Net 590 ml    Filed Weights   10/20/22 1445 10/20/22 2135  Weight: 53.2 kg 44.9 kg     Exam General exam: Appears calm and comfortable  Respiratory system: diminished air entry at bases.  Cardiovascular system: S1 & S2 heard, RRR. No JVD, Gastrointestinal system: Abdomen is nondistended, soft and nontender. Central nervous system: Alert and oriented. No focal neurological deficits. Extremities: Symmetric 5 x 5 power. Skin: No rashes, lesions or ulcers Psychiatry:  Mood & affect appropriate.      Data Reviewed:  I have personally reviewed following labs and imaging studies   CBC Lab Results  Component Value Date   WBC 9.7 10/24/2022   RBC 4.46 10/24/2022   HGB 13.3 10/24/2022   HCT 40.4 10/24/2022   MCV 90.6 10/24/2022   MCH 29.8 10/24/2022   PLT 202 10/24/2022   MCHC 32.9 10/24/2022   RDW 14.1 10/24/2022   LYMPHSABS 1.0 10/24/2022   MONOABS 0.7 10/24/2022   EOSABS 0.7 (H) 10/24/2022   BASOSABS 0.1 16/07/9603     Last metabolic panel Lab Results  Component Value Date   NA 144 10/24/2022   K 3.4 (L) 10/24/2022   CL 110 10/24/2022   CO2 23 10/24/2022   BUN 24 (H) 10/24/2022   CREATININE 0.66 10/24/2022   GLUCOSE 103 (H) 10/24/2022   GFRNONAA >60 10/24/2022   GFRAA 60 08/26/2020   CALCIUM 8.3 (L) 10/24/2022   PROT 5.4 (L) 10/21/2022   ALBUMIN 2.4 (L) 10/21/2022   BILITOT 0.8 10/21/2022   ALKPHOS 59 10/21/2022   AST 17 10/21/2022   ALT 12 10/21/2022   ANIONGAP 11 10/24/2022    CBG (last 3)  No results for input(s): "GLUCAP" in the last 72 hours.    Coagulation Profile: Recent Labs  Lab 10/20/22 1519  INR 1.1       Radiology Studies: ECHOCARDIOGRAM COMPLETE  Result Date: 10/24/2022  ECHOCARDIOGRAM REPORT   Patient Name:   Michele Burns [facility] Date of Exam: 10/24/2022 Medical Rec #:  [id number]         Height:       66.0 in Accession #:    [id number]        Weight:       99.0 lb Date of Birth:  04-Dec-1935         BSA:          1.483 m Patient Age:    74 years          BP:           96/68 mmHg Patient Gender: F                 HR:           71 bpm. Exam Location:  Inpatient Procedure: 2D Echo, Cardiac Doppler and Color Doppler Indications:    R06.02 SOB  History:        Patient has prior history of Echocardiogram examinations, most                 recent 10/09/2009. CAD, Signs/Symptoms:Alzheimer's, Altered                 Mental Status and Murmur; Risk Factors:Dyslipidemia and                 Hypertension.  Sonographer:    Roseanna Rainbow RDCS Referring Phys: [id number]  Sonographer Comments: Technically difficult study due to poor echo windows. Image acquisition challenging due to uncooperative patient. Patient with ams, moving througout study. Patient pulled off ekg sticker during echo. Patient has no IV access, unable  to perform Definity study. Patient with extremely small habitus. IMPRESSIONS  1. Left ventricular ejection fraction, by estimation, is 25 to 30%. The left ventricle has severely decreased function. The left ventricle demonstrates regional wall motion abnormalities (see scoring diagram/findings for description). The left ventricular internal cavity size was moderately dilated. There is moderate asymmetric left ventricular hypertrophy of the basal and septal segments. Left ventricular diastolic parameters are indeterminate.  2. Right ventricular systolic function is normal. The right ventricular size is normal. There is mildly elevated pulmonary artery systolic pressure.  3. The mitral valve is degenerative. No evidence of mitral valve regurgitation. No evidence of mitral stenosis.  4. The aortic valve  is tricuspid. There is moderate calcification of the aortic valve. There is moderate thickening of the aortic valve. Aortic valve regurgitation is mild. Aortic valve sclerosis/calcification is present, without any evidence of aortic stenosis.  5. The inferior vena cava is normal in size with greater than 50% respiratory variability, suggesting right atrial pressure of 3 mmHg. FINDINGS  Left Ventricle: Septal apical akinesis Infeior apex distal anterio wall hypokinetic May be consistent with LAD infarct vs Takatsubo DCM No mural thrombus prominent apical trabeculations. Left ventricular ejection fraction, by estimation, is 25 to 30%. The left ventricle has severely decreased function. The left ventricle demonstrates regional wall motion abnormalities. The left ventricular internal cavity size was moderately dilated. There is moderate asymmetric left ventricular hypertrophy of the basal and septal segments. Left ventricular diastolic parameters are indeterminate. Right Ventricle: The right ventricular size is normal. No increase in right ventricular wall thickness. Right ventricular systolic function is normal. There is mildly elevated pulmonary artery systolic pressure. The tricuspid regurgitant velocity is 2.71  m/s, and with an assumed right atrial pressure of 8 mmHg, the estimated right ventricular systolic pressure is  37.4 mmHg. Left Atrium: Left atrial size was normal in size. Right Atrium: Right atrial size was normal in size. Pericardium: There is no evidence of pericardial effusion. Mitral Valve: The mitral valve is degenerative in appearance. There is mild thickening of the mitral valve leaflet(s). There is mild calcification of the mitral valve leaflet(s). Mild mitral annular calcification. No evidence of mitral valve regurgitation. No evidence of mitral valve stenosis. Tricuspid Valve: The tricuspid valve is normal in structure. Tricuspid valve regurgitation is mild . No evidence of tricuspid stenosis.  Aortic Valve: The aortic valve is tricuspid. There is moderate calcification of the aortic valve. There is moderate thickening of the aortic valve. Aortic valve regurgitation is mild. Aortic valve sclerosis/calcification is present, without any evidence of aortic stenosis. Pulmonic Valve: The pulmonic valve was normal in structure. Pulmonic valve regurgitation is not visualized. No evidence of pulmonic stenosis. Aorta: The aortic root is normal in size and structure. Venous: The inferior vena cava is normal in size with greater than 50% respiratory variability, suggesting right atrial pressure of 3 mmHg. IAS/Shunts: No atrial level shunt detected by color flow Doppler.  LEFT VENTRICLE PLAX 2D LVIDd:         3.00 cm LVIDs:         2.40 cm LV PW:         0.80 cm LV IVS:        1.30 cm LVOT diam:     2.30 cm LV SV:         58 LV SV Index:   39 LVOT Area:     4.15 cm  LV Volumes (MOD) LV vol d, MOD A2C: 57.0 ml LV vol d, MOD A4C: 58.9 ml LV vol s, MOD A2C: 41.1 ml LV vol s, MOD A4C: 37.2 ml LV SV MOD A2C:     15.9 ml LV SV MOD A4C:     58.9 ml LV SV MOD BP:      18.7 ml RIGHT VENTRICLE            IVC RV S prime:     7.83 cm/s  IVC diam: 2.10 cm TAPSE (M-mode): 0.8 cm LEFT ATRIUM             Index        RIGHT ATRIUM          Index LA diam:        3.60 cm 2.43 cm/m   RA Area:     8.75 cm LA Vol (A2C):   30.8 ml 20.77 ml/m  RA Volume:   19.30 ml 13.01 ml/m LA Vol (A4C):   27.2 ml 18.34 ml/m LA Biplane Vol: 31.7 ml 21.37 ml/m  AORTIC VALVE             PULMONIC VALVE LVOT Vmax:   90.70 cm/s  PR End Diast Vel: 1.44 msec LVOT Vmean:  59.500 cm/s LVOT VTI:    0.139 m  AORTA Ao Root diam: 2.70 cm Ao Asc diam:  2.80 cm MITRAL VALVE                TRICUSPID VALVE MV Area (PHT): 5.38 cm     TR Peak grad:   29.4 mmHg MV Decel Time: 141 msec     TR Vmax:        271.00 cm/s MV E velocity: 107.00 cm/s  SHUNTS                             Systemic VTI:  0.14 m                             Systemic Diam:  2.30 cm Jenkins Rouge MD Electronically signed by Jenkins Rouge MD Signature Date/Time: 10/24/2022/10:37:09 AM    Final    US THORACENTESIS ASP PLEURAL SPACE W/IMG GUIDE  Result Date: 10/23/2022 INDICATION: Patient with recent history of pneumonia, bilateral pleural effusions. Request is made for diagnostic and therapeutic thoracentesis. EXAM: ULTRASOUND GUIDED RIGHT THORACENTESIS MEDICATIONS: 10 mL 1% lidocaine COMPLICATIONS: None immediate. PROCEDURE: An ultrasound guided thoracentesis was thoroughly discussed with the patient and questions answered. The benefits, risks, alternatives and complications were also discussed. The patient understands and wishes to proceed with the procedure. Written consent was obtained. Ultrasound was performed to localize and mark an adequate pocket of fluid in the right chest. The area was then prepped and draped in the normal sterile fashion. 1% Lidocaine was used for local anesthesia. Under ultrasound guidance a 6 Fr Safe-T-Centesis catheter was introduced. Thoracentesis was performed. The catheter was removed and a dressing applied. FINDINGS: A total of approximately 900 mL of yellow, clear fluid was removed. Samples were sent to the laboratory as requested by the clinical team. IMPRESSION: Successful ultrasound guided right thoracentesis yielding 900 mL of pleural fluid. Read by: Brynda Greathouse PA-C Electronically Signed   By: Sandi Mariscal M.D.   On: 10/23/2022 16:30   DG Chest Port 1 View  Result Date: 10/23/2022 CLINICAL DATA:  Status post right thoracentesis EXAM: PORTABLE CHEST 1 VIEW COMPARISON:  Chest radiograph dated 10/23/2022, CT chest dated 10/20/2022 FINDINGS: Low lung volumes. Bibasilar patchy opacities. Multifocal nodules are better evaluated on prior CT. Decreased small right pleural effusion. Similar small left pleural effusion. Curvilinear radiodensity in the mid right lung. No left pneumothorax. The heart size and mediastinal contours are within normal  limits. The visualized skeletal structures are unremarkable. IMPRESSION: 1. Decreased small right pleural effusion status post thoracentesis. Curvilinear radiodensity in the mid right lung likely corresponds to fluid within the major fissure when correlated to prior chest CT. Recommend attention on follow-up. 2. Similar small left pleural effusion. Multifocal nodules are better evaluated on prior CT. Electronically Signed   By: Darrin Nipper M.D.   On: 10/23/2022 16:02   DG Chest 2 View  Result Date: 10/23/2022 CLINICAL DATA:  Pleural effusions EXAM: CHEST - 2 VIEW COMPARISON:  10/20/2022 FINDINGS: There are large bilateral pleural effusions. There are numerous pulmonary nodules. Normal pulmonary vasculature. Bibasilar consolidation or volume loss. No pneumothorax. Aorta is calcified. IMPRESSION: 1. Large pleural effusions. 2. Multiple pulmonary nodules. 3. Bibasilar volume loss/consolidation. Electronically Signed   By: Sammie Bench M.D.   On: 10/23/2022 12:53       Hosie Poisson M.D. [facility] 10/24/2022, 4:35 PM  Available via Epic secure chat 7am-7pm After 7 pm, please refer to night coverage provider listed on amion.

## 2022-10-24 NOTE — Progress Notes (Signed)
  Echocardiogram 2D Echocardiogram has been performed.  Michele Burns 10/24/2022, 10:27 AM

## 2022-10-24 NOTE — Consult Note (Signed)
Cardiology Consultation   Patient ID: Michele Burns MRN: [id number]; DOB: 11-07-1935  Admit date: 10/20/2022 Date of Consult: 10/25/2022  PCP:  [name], [facility] Cardiologist:  Sherren Mocha, MD      Patient Profile:   Michele Burns is a 87 y.o. female with a hx of CAD s/p RCA PCI and rotational atherectomy in 2014, HTN, HLD, dementia, and TIAs who is being seen 10/25/2022 for Michele evaluation of newly diagnosed systolic HF at Michele request of Dr. Karleen Hampshire.  History of Present Illness:   Michele Burns is a 87 year old female with history as detailed above who follows with Dr. Burt Knack as outpatient. Has history of CAD s/p RCA PCI in 2014 with rotational atherectomy at that time. Exercise myoview in 2017 normal. Was last seen in 10/2020 where she was having palpitations but cardiac monitor was difficult to wear due to dementia. She was recommended to monitor with apple watch given rare episodes.  She presents on this admission with pneumonia that failed outpatient therapy. Course has been complicated by new diagnosis of systolic HF with TTE showed LVEF 25-30% where all apical segments were akinetic/hypokinetic and Michele mid septal segments and inferior segments appeared severely hypokinetic. Findings concerning for possible Takostubos vs LAD disease. Cardiology is now consulted for further management of new diagnosis of systolic HF.  On exam today, Michele patient is somnolent. Opened her eyes to voice but not responding to questions or commands.    Past Medical History:  Diagnosis Date   Cecal volvulus ([place]) 06/29/2014   Coronary artery disease    a. Cath 11/17/12 - severe Ca++ moderately severe disease in Michele mid RCA, otherwise nonobstructive disease - s/p PTCA/rotational atherectomy & 2 DES to mid RCA 11/22/12.   DEPRESSION    Erosive gastritis 2006   NSAID precipitated, resolved on f/u EGD   Hx of adenomatous colonic polyps 02/10/2015   Hx of colonic polyp  2006   diminutive '06, no polyps on 4/09 colo - for repeat 4/14   Hyperlipidemia    Hypertension    IBS (irritable bowel syndrome)    MIGRAINE HEADACHE    POSTMENOPAUSAL SYNDROME    PULMONARY NODULE    PVD    normal ABI/TBI 09/2009, chronic L toe pain and vasospasm ([place])   RENAL CYST    Sinus bradycardia    a. not on BB due to this.    Past Surgical History:  Procedure Laterality Date   ABDOMINAL HYSTERECTOMY     artherectomy  11/22/2012   RCA   COLONOSCOPY     ESOPHAGOGASTRODUODENOSCOPY     EXCISION MORTON'S NEUROMA  1971   L 3/4 toe space   LAPAROSCOPIC RIGHT HEMI COLECTOMY  06/29/2014   LAPAROTOMY N/A 06/29/2014   Procedure: EXPLORATORY LAPAROTOMY;  Surgeon: Stark Klein, MD;  Location: [facility];  Service: General;  Laterality: N/A;   LEFT HEART CATHETERIZATION WITH CORONARY ANGIOGRAM N/A 11/17/2012   Procedure: LEFT HEART CATHETERIZATION WITH CORONARY ANGIOGRAM;  Surgeon: Peter M Martinique, MD;  Location: [facility] CATH LAB;  Service: Cardiovascular;  Laterality: N/A;   PARTIAL COLECTOMY N/A 06/29/2014   Procedure: PARTIAL COLECTOMY;  Surgeon: Stark Klein, MD;  Location: [place];  Service: General;  Laterality: N/A;   PERCUTANEOUS CORONARY ROTOBLATOR INTERVENTION (PCI-R)  11/22/2012   Procedure: PERCUTANEOUS CORONARY ROTOBLATOR INTERVENTION (PCI-R);  Surgeon: Peter M Martinique, MD;  Location: [facility] CATH LAB;  Service: Cardiovascular;;   PERCUTANEOUS CORONARY STENT INTERVENTION (PCI-S) N/A 11/22/2012  Procedure: PERCUTANEOUS CORONARY STENT INTERVENTION (PCI-S);  Surgeon: Peter M Martinique, MD;  Location: [facility] CATH LAB;  Service: Cardiovascular;  Laterality: N/A;   PTCA     TONSILLECTOMY     As a child   TONSILLECTOMY     VAGINAL HYSTERECTOMY       Home Medications:  Prior to Admission medications   Medication Sig Start Date End Date Taking? Authorizing Provider  albuterol (VENTOLIN HFA) 108 (90 Base) MCG/ACT inhaler Inhale 2 puffs into Michele lungs every 6 (six) hours as needed for wheezing or  shortness of breath.   Yes [provider]  aspirin EC 325 MG tablet Take 325 mg by mouth every 6 (six) hours as needed (headache).   Yes [provider]  aspirin EC 81 MG tablet Take 1 tablet (81 mg total) by mouth daily. Swallow whole. Patient taking differently: Take 81 mg by mouth daily as needed for moderate pain (headache). Swallow whole. 10/24/20  Yes Bhagat, Bhavinkumar, PA  levofloxacin (LEVAQUIN) 500 MG tablet Take 500 mg by mouth daily.   Yes [provider]  traZODone (DESYREL) 100 MG tablet Take 100 mg by mouth at bedtime as needed for sleep.   Yes [provider]  simvastatin (ZOCOR) 40 MG tablet TAKE 1 TABLET BY MOUTH EVERY NIGHT AT BEDTIME. Patient not taking: Reported on 10/20/2022 11/22/18   Liliane Shi, PA-C    Inpatient Medications: Scheduled Meds:  amoxicillin-clavulanate  1 tablet Oral Q12H   cyanocobalamin  1,000 mcg Intramuscular Daily   feeding supplement  1 Container Oral TID BM   folic acid  1 mg Oral Daily   furosemide  20 mg Oral Daily   guaiFENesin  600 mg Oral BID   multivitamin with minerals  1 tablet Oral Daily   polyethylene glycol  17 g Oral Daily   senna-docusate  2 tablet Oral BID   Continuous Infusions:  PRN Meds: acetaminophen **OR** acetaminophen, albuterol, traZODone  Allergies:    Allergies  Allergen Reactions   Codeine Nausea Only   Cortisone Nausea Only    Social History:   Social History   Socioeconomic History   Marital status: Widowed    Spouse name: Not on file   Number of children: 3   Years of education: Not on file   Highest education level: Not on file  Occupational History   Occupation: [employer]    Employer: [employer]  Tobacco Use   Smoking status: Former    Years: 30.00    Types: Cigarettes    Quit date: 10/12/1980    Years since quitting: 42.0   Smokeless tobacco: Never   Tobacco comments:    She is widowed since 2002-she has 3 grown children and 5 g-kids. Moved to  [place] from [place] in 2010 to be close to family  Vaping Use   Vaping Use: Never used  Substance and Sexual Activity   Alcohol use: Yes    Alcohol/week: 10.0 standard drinks of alcohol    Types: 10 Glasses of wine per week    Comment: <2 /day, white wine twice daily   Drug use: No   Sexual activity: Not on file  Other Topics Concern   Not on file  Social History Narrative   Lives alone in a one story home. Husband passed away 15 year ago. Has 3 children.     Works part time in [employer] (medical).     Education: 2 years of college.    Social Determinants of Health  Financial Resource Strain: Not on file  Food Insecurity: Not on file  Transportation Needs: Not on file  Physical Activity: Not on file  Stress: Not on file  Social Connections: Not on file  Intimate Partner Violence: Not on file    Family History:    Family History  Problem Relation Age of Onset   Heart attack Father 41       deceased   Heart attack Mother 27       deceased   Diabetes type II Mother    Diabetes Mother    Heart attack Brother 51       deceased   Diabetes Paternal Grandmother        IDDM   Healthy Burns        x 2   Healthy Daughter    Colon cancer Neg Hx    Colon polyps Neg Hx    Kidney disease Neg Hx    Esophageal cancer Neg Hx    Gallbladder disease Neg Hx    Hypertension Neg Hx    Stroke Neg Hx      ROS:  Please see Michele history of present illness.   All other ROS reviewed and negative.     Physical Exam/Data:   Vitals:   10/24/22 0521 10/24/22 1325 10/24/22 1949 10/25/22 0459  BP: 96/68 (!) 108/90 124/89 121/83  Pulse: 72 84 86 78  Resp: '19 16 16 18  '$ Temp: 97.6 F (36.4 C) (!) 97.4 F (36.3 C)  (!) 97.5 F (36.4 C)  TempSrc: Oral   Oral  SpO2: 96% 100% 97% 100%  Weight:      Height:        Intake/Output Summary (Last 24 hours) at 10/25/2022 0706 Last data filed at 10/25/2022 0552 Gross per 24 hour  Intake 600 ml  Output 0 ml  Net 600 ml      10/20/2022    9:35 PM  10/20/2022    2:45 PM 10/24/2020   11:36 AM  Last 3 Weights  Weight (lbs) 98 lb 15.8 oz 117 lb 3.2 oz 117 lb 3.2 oz  Weight (kg) 44.9 kg 53.162 kg 53.162 kg     Body mass index is 15.98 kg/m.  General:  Elderly female, somnolent, opens eyes to voice but not following questions or commands HEENT: normal Neck: no JVD Vascular: No carotid bruits; Distal pulses 2+ bilaterally Cardiac:  RRR, no murmurs Lungs:  clear to auscultation bilaterally, no wheezing, rhonchi or rales  Abd: soft, nontender, no hepatomegaly  Ext: no edema Musculoskeletal:  No deformities, BUE and BLE strength normal and equal Skin: warm and dry  Neuro:  Somnolent, not following commands Psych: Somnolent   EKG:  Michele EKG was personally reviewed and demonstrates:  NSR, PAC, LAFB Telemetry:  Telemetry was personally reviewed and demonstrates:  Not on telemetry  Relevant CV Studies: TTE 10/24/22: IMPRESSIONS     1. Left ventricular ejection fraction, by estimation, is 25 to 30%. Michele  left ventricle has severely decreased function. Michele left ventricle  demonstrates regional wall motion abnormalities (see scoring  diagram/findings for description). Michele left  ventricular internal cavity size was moderately dilated. There is moderate  asymmetric left ventricular hypertrophy of Michele basal and septal segments.  Left ventricular diastolic parameters are indeterminate.   2. Right ventricular systolic function is normal. Michele right ventricular  size is normal. There is mildly elevated pulmonary artery systolic  pressure.   3. Michele mitral valve is degenerative. No evidence of mitral  valve  regurgitation. No evidence of mitral stenosis.   4. Michele aortic valve is tricuspid. There is moderate calcification of Michele  aortic valve. There is moderate thickening of Michele aortic valve. Aortic  valve regurgitation is mild. Aortic valve sclerosis/calcification is  present, without any evidence of aortic  stenosis.   5. Michele inferior vena  cava is normal in size with greater than 50%  respiratory variability, suggesting right atrial pressure of 3 mmHg.   Laboratory Data:  High Sensitivity Troponin:  No results for input(s): "TROPONINIHS" in Michele last 720 hours.   Chemistry Recent Labs  Lab 10/20/22 1519 10/21/22 0440 10/24/22 0603  NA 138 138 144  K 3.7 3.8 3.4*  CL 104 107 110  CO2 24 21* 23  GLUCOSE 83 85 103*  BUN 25* 22 24*  CREATININE 1.05* 0.95 0.66  CALCIUM 8.9 8.5* 8.3*  MG  --   --  2.0  GFRNONAA 52* 59* >60  ANIONGAP '10 10 11    '$ Recent Labs  Lab 10/20/22 1519 10/21/22 0440  PROT 6.9 5.4*  ALBUMIN 3.3* 2.4*  AST 18 17  ALT 13 12  ALKPHOS 81 59  BILITOT 0.9 0.8   Lipids No results for input(s): "CHOL", "TRIG", "HDL", "LABVLDL", "LDLCALC", "CHOLHDL" in Michele last 168 hours.  Hematology Recent Labs  Lab 10/20/22 1519 10/21/22 0440 10/24/22 0603  WBC 11.1* 9.3 9.7  RBC 4.31 3.81* 4.46  HGB 12.7 11.2* 13.3  HCT 38.7 34.9* 40.4  MCV 89.8 91.6 90.6  MCH 29.5 29.4 29.8  MCHC 32.8 32.1 32.9  RDW 13.6 13.9 14.1  PLT 180 192 202   Thyroid No results for input(s): "TSH", "FREET4" in Michele last 168 hours.  BNPNo results for input(s): "BNP", "PROBNP" in Michele last 168 hours.  DDimer No results for input(s): "DDIMER" in Michele last 168 hours.   Radiology/Studies:  ECHOCARDIOGRAM COMPLETE  Result Date: 10/24/2022    ECHOCARDIOGRAM REPORT   Patient Name:   DORIEN BESSENT [facility] Date of Exam: 10/24/2022 Medical Rec #:  [id number]         Height:       66.0 in Accession #:    [id number]        Weight:       99.0 lb Date of Birth:  [birth year]         BSA:          1.483 m Patient Age:    63 years          BP:           96/68 mmHg Patient Gender: F                 HR:           71 bpm. Exam Location:  Inpatient Procedure: 2D Echo, Cardiac Doppler and Color Doppler Indications:    R06.02 SOB  History:        Patient has prior history of Echocardiogram examinations, most                 recent 10/09/2009. CAD,  Signs/Symptoms:Alzheimer's, Altered                 Mental Status and Murmur; Risk Factors:Dyslipidemia and                 Hypertension.  Sonographer:    Roseanna Rainbow RDCS Referring Phys: [id number] [facility]  Sonographer Comments: Technically difficult study due to poor echo windows. Image acquisition challenging due  to uncooperative patient. Patient with ams, moving througout study. Patient pulled off ekg sticker during echo. Patient has no IV access, unable  to perform Definity study. Patient with extremely small habitus. IMPRESSIONS  1. Left ventricular ejection fraction, by estimation, is 25 to 30%. Michele left ventricle has severely decreased function. Michele left ventricle demonstrates regional wall motion abnormalities (see scoring diagram/findings for description). Michele left ventricular internal cavity size was moderately dilated. There is moderate asymmetric left ventricular hypertrophy of Michele basal and septal segments. Left ventricular diastolic parameters are indeterminate.  2. Right ventricular systolic function is normal. Michele right ventricular size is normal. There is mildly elevated pulmonary artery systolic pressure.  3. Michele mitral valve is degenerative. No evidence of mitral valve regurgitation. No evidence of mitral stenosis.  4. Michele aortic valve is tricuspid. There is moderate calcification of Michele aortic valve. There is moderate thickening of Michele aortic valve. Aortic valve regurgitation is mild. Aortic valve sclerosis/calcification is present, without any evidence of aortic stenosis.  5. Michele inferior vena cava is normal in size with greater than 50% respiratory variability, suggesting right atrial pressure of 3 mmHg. FINDINGS  Left Ventricle: Septal apical akinesis Infeior apex distal anterio wall hypokinetic May be consistent with LAD infarct vs Takatsubo DCM No mural thrombus prominent apical trabeculations. Left ventricular ejection fraction, by estimation, is 25 to 30%. Michele left ventricle has severely  decreased function. Michele left ventricle demonstrates regional wall motion abnormalities. Michele left ventricular internal cavity size was moderately dilated. There is moderate asymmetric left ventricular hypertrophy of Michele basal and septal segments. Left ventricular diastolic parameters are indeterminate. Right Ventricle: Michele right ventricular size is normal. No increase in right ventricular wall thickness. Right ventricular systolic function is normal. There is mildly elevated pulmonary artery systolic pressure. Michele tricuspid regurgitant velocity is 2.71  m/s, and with an assumed right atrial pressure of 8 mmHg, Michele estimated right ventricular systolic pressure is 00.3 mmHg. Left Atrium: Left atrial size was normal in size. Right Atrium: Right atrial size was normal in size. Pericardium: There is no evidence of pericardial effusion. Mitral Valve: Michele mitral valve is degenerative in appearance. There is mild thickening of Michele mitral valve leaflet(s). There is mild calcification of Michele mitral valve leaflet(s). Mild mitral annular calcification. No evidence of mitral valve regurgitation. No evidence of mitral valve stenosis. Tricuspid Valve: Michele tricuspid valve is normal in structure. Tricuspid valve regurgitation is mild . No evidence of tricuspid stenosis. Aortic Valve: Michele aortic valve is tricuspid. There is moderate calcification of Michele aortic valve. There is moderate thickening of Michele aortic valve. Aortic valve regurgitation is mild. Aortic valve sclerosis/calcification is present, without any evidence of aortic stenosis. Pulmonic Valve: Michele pulmonic valve was normal in structure. Pulmonic valve regurgitation is not visualized. No evidence of pulmonic stenosis. Aorta: Michele aortic root is normal in size and structure. Venous: Michele inferior vena cava is normal in size with greater than 50% respiratory variability, suggesting right atrial pressure of 3 mmHg. IAS/Shunts: No atrial level shunt detected by color flow  Doppler.  LEFT VENTRICLE PLAX 2D LVIDd:         3.00 cm LVIDs:         2.40 cm LV PW:         0.80 cm LV IVS:        1.30 cm LVOT diam:     2.30 cm LV SV:         58 LV SV Index:   39 LVOT  Area:     4.15 cm  LV Volumes (MOD) LV vol d, MOD A2C: 57.0 ml LV vol d, MOD A4C: 58.9 ml LV vol s, MOD A2C: 41.1 ml LV vol s, MOD A4C: 37.2 ml LV SV MOD A2C:     15.9 ml LV SV MOD A4C:     58.9 ml LV SV MOD BP:      18.7 ml RIGHT VENTRICLE            IVC RV S prime:     7.83 cm/s  IVC diam: 2.10 cm TAPSE (M-mode): 0.8 cm LEFT ATRIUM             Index        RIGHT ATRIUM          Index LA diam:        3.60 cm 2.43 cm/m   RA Area:     8.75 cm LA Vol (A2C):   30.8 ml 20.77 ml/m  RA Volume:   19.30 ml 13.01 ml/m LA Vol (A4C):   27.2 ml 18.34 ml/m LA Biplane Vol: 31.7 ml 21.37 ml/m  AORTIC VALVE             PULMONIC VALVE LVOT Vmax:   90.70 cm/s  PR End Diast Vel: 1.44 msec LVOT Vmean:  59.500 cm/s LVOT VTI:    0.139 m  AORTA Ao Root diam: 2.70 cm Ao Asc diam:  2.80 cm MITRAL VALVE                TRICUSPID VALVE MV Area (PHT): 5.38 cm     TR Peak grad:   29.4 mmHg MV Decel Time: 141 msec     TR Vmax:        271.00 cm/s MV E velocity: 107.00 cm/s                             SHUNTS                             Systemic VTI:  0.14 m                             Systemic Diam: 2.30 cm Jenkins Rouge MD Electronically signed by Jenkins Rouge MD Signature Date/Time: 10/24/2022/10:37:09 AM    Final    US THORACENTESIS ASP PLEURAL SPACE W/IMG GUIDE  Result Date: 10/23/2022 INDICATION: Patient with recent history of pneumonia, bilateral pleural effusions. Request is made for diagnostic and therapeutic thoracentesis. EXAM: ULTRASOUND GUIDED RIGHT THORACENTESIS MEDICATIONS: 10 mL 1% lidocaine COMPLICATIONS: None immediate. PROCEDURE: An ultrasound guided thoracentesis was thoroughly discussed with Michele patient and questions answered. Michele benefits, risks, alternatives and complications were also discussed. Michele patient understands and wishes  to proceed with Michele procedure. Written consent was obtained. Ultrasound was performed to localize and mark an adequate pocket of fluid in Michele right chest. Michele area was then prepped and draped in Michele normal sterile fashion. 1% Lidocaine was used for local anesthesia. Under ultrasound guidance a 6 Fr Safe-T-Centesis catheter was introduced. Thoracentesis was performed. Michele catheter was removed and a dressing applied. FINDINGS: A total of approximately 900 mL of yellow, clear fluid was removed. Samples were sent to Michele laboratory as requested by Michele clinical team. IMPRESSION: Successful ultrasound guided right thoracentesis yielding 900 mL of pleural fluid. Read by: Brynda Greathouse PA-C  Electronically Signed   By: Sandi Mariscal M.D.   On: 10/23/2022 16:30   DG Chest Port 1 View  Result Date: 10/23/2022 CLINICAL DATA:  Status post right thoracentesis EXAM: PORTABLE CHEST 1 VIEW COMPARISON:  Chest radiograph dated 10/23/2022, CT chest dated 10/20/2022 FINDINGS: Low lung volumes. Bibasilar patchy opacities. Multifocal nodules are better evaluated on prior CT. Decreased small right pleural effusion. Similar small left pleural effusion. Curvilinear radiodensity in Michele mid right lung. No left pneumothorax. Michele heart size and mediastinal contours are within normal limits. Michele visualized skeletal structures are unremarkable. IMPRESSION: 1. Decreased small right pleural effusion status post thoracentesis. Curvilinear radiodensity in Michele mid right lung likely corresponds to fluid within Michele major fissure when correlated to prior chest CT. Recommend attention on follow-up. 2. Similar small left pleural effusion. Multifocal nodules are better evaluated on prior CT. Electronically Signed   By: Darrin Nipper M.D.   On: 10/23/2022 16:02   DG Chest 2 View  Result Date: 10/23/2022 CLINICAL DATA:  Pleural effusions EXAM: CHEST - 2 VIEW COMPARISON:  10/20/2022 FINDINGS: There are large bilateral pleural effusions. There are numerous  pulmonary nodules. Normal pulmonary vasculature. Bibasilar consolidation or volume loss. No pneumothorax. Aorta is calcified. IMPRESSION: 1. Large pleural effusions. 2. Multiple pulmonary nodules. 3. Bibasilar volume loss/consolidation. Electronically Signed   By: Sammie Bench M.D.   On: 10/23/2022 12:53     Assessment and Plan:   #Newly Diagnosed Systolic HF: Patient presented with PNA that failed outpatient therapy. Echo on admission showed newly reduced LVEF 25-30% with akinesis of Michele apical segments/apex and hypokinesis of Michele mid septal and inferior LV walls. Given history, findings most concerning of takostubo CM in Michele setting of pneumonia, however, cannot rule out LAD disease. Discussion held with Michele Burns given overall clinical decline of his mother. At this time, we will focus on maximizing comfort with no plans for invasive work-up. Will add lasix as needed for edema/worsening SOB. Will not add other GDMT in order to minimize meds and likely limited benefit as she appears to be nearing Michele end of her life. Family is interested in pursuing possible hospice. -Discussion held with family and will focus on maximizing comfort at this time -Will change lasix to '20mg'$  prn for SOB or weight gain -Not adding GDMT for now as family is considering hospice and likely there will be no significant benefit from a clinical standpoint at this time as she is nearing end of her life -Palliative is following; appreciate recommendations  #Community Acquired PNA: -Management per primary  #Dementia: #GOC -Following with palliative care and family is looking into hospice -Per discussion with Michele Burns, will add meds that will make patient feel better but no plans for any invasive measures  Cardiology will sign-off. Please do not hesitate to call with questions or concerns.   Risk Assessment/Risk Scores:        New York Heart Association (NYHA) Functional Class NYHA Class II        For  questions or updates, please contact [place] Please consult [email or web address] for contact info under    Signed, Freada Bergeron, MD  10/25/2022 7:06 AM

## 2022-10-25 DIAGNOSIS — Z7189 Other specified counseling: Secondary | ICD-10-CM

## 2022-10-25 DIAGNOSIS — I5021 Acute systolic (congestive) heart failure: Secondary | ICD-10-CM

## 2022-10-25 DIAGNOSIS — Z515 Encounter for palliative care: Secondary | ICD-10-CM

## 2022-10-25 DIAGNOSIS — E43 Unspecified severe protein-calorie malnutrition: Secondary | ICD-10-CM | POA: Diagnosis not present

## 2022-10-25 DIAGNOSIS — R531 Weakness: Secondary | ICD-10-CM | POA: Diagnosis not present

## 2022-10-25 DIAGNOSIS — F02818 Dementia in other diseases classified elsewhere, unspecified severity, with other behavioral disturbance: Secondary | ICD-10-CM

## 2022-10-25 DIAGNOSIS — F0394 Unspecified dementia, unspecified severity, with anxiety: Secondary | ICD-10-CM | POA: Diagnosis not present

## 2022-10-25 DIAGNOSIS — G309 Alzheimer's disease, unspecified: Secondary | ICD-10-CM | POA: Diagnosis not present

## 2022-10-25 DIAGNOSIS — E785 Hyperlipidemia, unspecified: Secondary | ICD-10-CM | POA: Diagnosis not present

## 2022-10-25 DIAGNOSIS — J189 Pneumonia, unspecified organism: Secondary | ICD-10-CM | POA: Diagnosis not present

## 2022-10-25 LAB — CULTURE, BLOOD (ROUTINE X 2)
Culture: NO GROWTH
Culture: NO GROWTH
Special Requests: ADEQUATE
Special Requests: ADEQUATE

## 2022-10-25 LAB — BRAIN NATRIURETIC PEPTIDE: B Natriuretic Peptide: 664.1 pg/mL — ABNORMAL HIGH (ref 0.0–100.0)

## 2022-10-25 MED ORDER — ADULT MULTIVITAMIN LIQUID CH
15.0000 mL | Freq: Every day | ORAL | Status: DC
  Filled 2022-10-25 (×3): qty 15

## 2022-10-25 MED ORDER — FUROSEMIDE 20 MG PO TABS: 20.0000 mg | ORAL_TABLET | Freq: Every day | ORAL | Status: DC | PRN

## 2022-10-25 MED ORDER — AMOXICILLIN-POT CLAVULANATE 250-62.5 MG/5ML PO SUSR
500.0000 mg | Freq: Two times a day (BID) | ORAL | Status: DC
  Filled 2022-10-25 (×4): qty 10

## 2022-10-25 MED ORDER — GUAIFENESIN-DM 100-10 MG/5ML PO SYRP: 5.0000 mL | ORAL_SOLUTION | ORAL | Status: DC | PRN

## 2022-10-25 NOTE — Progress Notes (Signed)
[facility]                                                                               Michele Burns, is a 87 y.o. female, DOB - 09-29-1936, UKG:[id number] Admit date - 10/20/2022    Outpatient Primary MD for the patient is Michele Hatchet, MD  LOS - 4  days    Brief summary   Michele Burns is a 87 y.o. female with medical history significant of dementia, CAD, HLD. Presenting with shortness of breath and cough. She has had symptoms for the last week. She was seen by her PCP and diagnosed with PNA. She was given a course of abx; however, after completeing them, she is not feeling better. She was admitted for further evaluation of the CAP.  She was started on IV antibiotics and transitioned to oral antibiotics.    Assessment & Plan    Assessment and Plan:  Community Acquired pneumonia:  She was started on IV Ceftriaxone and IV Azithromycin, transitioned to oral antibiotics.  Currently she is requiring up to 3 L of nasal cannula oxygen. Slowly wean her off oxygen as tolerated.  Continue with cough medication and nebulizers as needed. Unable to wean her off oxygen. Repeat CXR shows bilateral effusions.  US thoracentesis done and 900 ml of clear fluid taken out.  Pleural fluid sent for analysis so far no signs of infection at this time.   Dementia No agitation.     Newly diagnosed CHF:  LVef is about 25% to 30%. Left ventricle has severely decreased function. The left ventricle demonstrates regional wall abn. There is moderate asymmetric left ventricular hypertrophy of the basal and septal segments.  Left ventricular diastolic parameters are indeterminate. Right ventricular systolic function is normal  Continue with strict intake and output and daily weights.  Started her on Lasix 20 mg daily. Cardiology consulted, recommendations given. No further work up at this time.    Hyperlipidemia -Statin on hold at this time.   Therapy eval recommending SNF. But  in view of her clinical decline, reaching end of like, family would like to focus on comfort at this time. They would like to watch her on the management for another 1 to 2 days and if no clinical improvement, transition to comfort measures.    Protein calorie malnutrition/ severe malnutrition.    Estimated body mass index is 15.98 kg/m as calculated from the following:   Height as of this encounter: '5\' 6"'$  (1.676 m).   Weight as of this encounter: 44.9 kg.  Code Status: DNR.  DVT Prophylaxis:  SCDs Start: 10/20/22 2037   Level of Care: Level of care: Med-Surg Family Communication: none at bedside.   Disposition Plan:     Remains inpatient appropriate:  progressive decline, monitor for 48 hours and decide for SNF vs RESIDENTIAL HOSPICE.  Procedures:  None.  Consultants:   Palliative care  Cardiology.   Antimicrobials:   Anti-infectives (From admission, onward)    Start     Dose/Rate Route Frequency Ordered Stop   10/25/22 1545  amoxicillin-clavulanate (AUGMENTIN) 250-62.5 MG/5ML suspension 500 mg        500 mg Oral Every 12  hours 10/25/22 1449     10/22/22 2200  amoxicillin-clavulanate (AUGMENTIN) 875-125 MG per tablet 1 tablet  Status:  Discontinued        1 tablet Oral Every 12 hours 10/22/22 1613 10/25/22 1449   10/20/22 1445  azithromycin (ZITHROMAX) 500 mg in sodium chloride 0.9 % 250 mL IVPB  Status:  Discontinued        500 mg 250 mL/hr over 60 Minutes Intravenous Every 24 hours 10/20/22 1432 10/22/22 1613   10/20/22 1445  cefTRIAXone (ROCEPHIN) 1 g in sodium chloride 0.9 % 100 mL IVPB  Status:  Discontinued        1 g 200 mL/hr over 30 Minutes Intravenous Every 24 hours 10/20/22 1432 10/22/22 1613        Medications  Scheduled Meds:  amoxicillin-clavulanate  500 mg Oral Q12H   cyanocobalamin  1,000 mcg Intramuscular Daily   feeding supplement  1 Container Oral TID BM   folic acid  1 mg Oral Daily   multivitamin  15 mL Oral Daily   polyethylene glycol  17 g  Oral Daily   Continuous Infusions:   PRN Meds:.acetaminophen **OR** acetaminophen, albuterol, furosemide, guaiFENesin-dextromethorphan, traZODone    Subjective:   [name] was seen and examined today.  Patient is more lethargic than usual remains on 2 L of nasal cannula oxygen at this time.  Objective:   Vitals:   10/24/22 1325 10/24/22 1949 10/25/22 0459 10/25/22 1319  BP: (!) 108/90 124/89 121/83 131/87  Pulse: 84 86 78 73  Resp: '16 16 18 14  '$ Temp: (!) 97.4 F (36.3 C)  (!) 97.5 F (36.4 C) (!) 97.5 F (36.4 C)  TempSrc:   Oral Oral  SpO2: 100% 97% 100% 99%  Weight:      Height:        Intake/Output Summary (Last 24 hours) at 10/25/2022 1627 Last data filed at 10/25/2022 1400 Gross per 24 hour  Intake 130 ml  Output 0 ml  Net 130 ml    Filed Weights   10/20/22 1445 10/20/22 2135  Weight: 53.2 kg 44.9 kg     Exam General exam: Elderly frail appearing woman not in any kind of distress Respiratory system: Diminished air entry.  At bases on 2 L of nasal cannula oxygen Cardiovascular system: S1 & S2 heard, RRR. No JVD,. No pedal edema. Gastrointestinal system: Abdomen is nondistended, soft and nontender.  Central nervous system: Lethargic Extremities: Symmetric 5 x 5 power. Skin: No rashes, Psychiatry: Unable to assess     Data Reviewed:  I have personally reviewed following labs and imaging studies   CBC Lab Results  Component Value Date   WBC 9.7 10/24/2022   RBC 4.46 10/24/2022   HGB 13.3 10/24/2022   HCT 40.4 10/24/2022   MCV 90.6 10/24/2022   MCH 29.8 10/24/2022   PLT 202 10/24/2022   MCHC 32.9 10/24/2022   RDW 14.1 10/24/2022   LYMPHSABS 1.0 10/24/2022   MONOABS 0.7 10/24/2022   EOSABS 0.7 (H) 10/24/2022   BASOSABS 0.1 66/29/4765     Last metabolic panel Lab Results  Component Value Date   NA 144 10/24/2022   K 3.4 (L) 10/24/2022   CL 110 10/24/2022   CO2 23 10/24/2022   BUN 24 (H) 10/24/2022   CREATININE 0.66 10/24/2022    GLUCOSE 103 (H) 10/24/2022   GFRNONAA >60 10/24/2022   GFRAA 60 08/26/2020   CALCIUM 8.3 (L) 10/24/2022   PROT 5.4 (L) 10/21/2022   ALBUMIN 2.4 (L) 10/21/2022  BILITOT 0.8 10/21/2022   ALKPHOS 59 10/21/2022   AST 17 10/21/2022   ALT 12 10/21/2022   ANIONGAP 11 10/24/2022    CBG (last 3)  No results for input(s): "GLUCAP" in the last 72 hours.    Coagulation Profile: Recent Labs  Lab 10/20/22 1519  INR 1.1      Radiology Studies: ECHOCARDIOGRAM COMPLETE  Result Date: 10/24/2022    ECHOCARDIOGRAM REPORT   Patient Name:   MAGDALEN CABANA [facility] Date of Exam: 10/24/2022 Medical Rec #:  [id number]         Height:       66.0 in Accession #:    [id number]        Weight:       99.0 lb Date of Birth:  16-Feb-1936         BSA:          1.483 m Patient Age:    89 years          BP:           96/68 mmHg Patient Gender: F                 HR:           71 bpm. Exam Location:  Inpatient Procedure: 2D Echo, Cardiac Doppler and Color Doppler Indications:    R06.02 SOB  History:        Patient has prior history of Echocardiogram examinations, most                 recent 10/09/2009. CAD, Signs/Symptoms:Alzheimer's, Altered                 Mental Status and Murmur; Risk Factors:Dyslipidemia and                 Hypertension.  Sonographer:    Roseanna Rainbow RDCS Referring Phys: [id number] [facility]  Sonographer Comments: Technically difficult study due to poor echo windows. Image acquisition challenging due to uncooperative patient. Patient with ams, moving througout study. Patient pulled off ekg sticker during echo. Patient has no IV access, unable  to perform Definity study. Patient with extremely small habitus. IMPRESSIONS  1. Left ventricular ejection fraction, by estimation, is 25 to 30%. The left ventricle has severely decreased function. The left ventricle demonstrates regional wall motion abnormalities (see scoring diagram/findings for description). The left ventricular internal cavity size was moderately  dilated. There is moderate asymmetric left ventricular hypertrophy of the basal and septal segments. Left ventricular diastolic parameters are indeterminate.  2. Right ventricular systolic function is normal. The right ventricular size is normal. There is mildly elevated pulmonary artery systolic pressure.  3. The mitral valve is degenerative. No evidence of mitral valve regurgitation. No evidence of mitral stenosis.  4. The aortic valve is tricuspid. There is moderate calcification of the aortic valve. There is moderate thickening of the aortic valve. Aortic valve regurgitation is mild. Aortic valve sclerosis/calcification is present, without any evidence of aortic stenosis.  5. The inferior vena cava is normal in size with greater than 50% respiratory variability, suggesting right atrial pressure of 3 mmHg. FINDINGS  Left Ventricle: Septal apical akinesis Infeior apex distal anterio wall hypokinetic May be consistent with LAD infarct vs Takatsubo DCM No mural thrombus prominent apical trabeculations. Left ventricular ejection fraction, by estimation, is 25 to 30%. The left ventricle has severely decreased function. The left ventricle demonstrates regional wall motion abnormalities. The left ventricular internal cavity size was moderately dilated. There is moderate  asymmetric left ventricular hypertrophy of the basal and septal segments. Left ventricular diastolic parameters are indeterminate. Right Ventricle: The right ventricular size is normal. No increase in right ventricular wall thickness. Right ventricular systolic function is normal. There is mildly elevated pulmonary artery systolic pressure. The tricuspid regurgitant velocity is 2.71  m/s, and with an assumed right atrial pressure of 8 mmHg, the estimated right ventricular systolic pressure is 13.0 mmHg. Left Atrium: Left atrial size was normal in size. Right Atrium: Right atrial size was normal in size. Pericardium: There is no evidence of pericardial  effusion. Mitral Valve: The mitral valve is degenerative in appearance. There is mild thickening of the mitral valve leaflet(s). There is mild calcification of the mitral valve leaflet(s). Mild mitral annular calcification. No evidence of mitral valve regurgitation. No evidence of mitral valve stenosis. Tricuspid Valve: The tricuspid valve is normal in structure. Tricuspid valve regurgitation is mild . No evidence of tricuspid stenosis. Aortic Valve: The aortic valve is tricuspid. There is moderate calcification of the aortic valve. There is moderate thickening of the aortic valve. Aortic valve regurgitation is mild. Aortic valve sclerosis/calcification is present, without any evidence of aortic stenosis. Pulmonic Valve: The pulmonic valve was normal in structure. Pulmonic valve regurgitation is not visualized. No evidence of pulmonic stenosis. Aorta: The aortic root is normal in size and structure. Venous: The inferior vena cava is normal in size with greater than 50% respiratory variability, suggesting right atrial pressure of 3 mmHg. IAS/Shunts: No atrial level shunt detected by color flow Doppler.  LEFT VENTRICLE PLAX 2D LVIDd:         3.00 cm LVIDs:         2.40 cm LV PW:         0.80 cm LV IVS:        1.30 cm LVOT diam:     2.30 cm LV SV:         58 LV SV Index:   39 LVOT Area:     4.15 cm  LV Volumes (MOD) LV vol d, MOD A2C: 57.0 ml LV vol d, MOD A4C: 58.9 ml LV vol s, MOD A2C: 41.1 ml LV vol s, MOD A4C: 37.2 ml LV SV MOD A2C:     15.9 ml LV SV MOD A4C:     58.9 ml LV SV MOD BP:      18.7 ml RIGHT VENTRICLE            IVC RV S prime:     7.83 cm/s  IVC diam: 2.10 cm TAPSE (M-mode): 0.8 cm LEFT ATRIUM             Index        RIGHT ATRIUM          Index LA diam:        3.60 cm 2.43 cm/m   RA Area:     8.75 cm LA Vol (A2C):   30.8 ml 20.77 ml/m  RA Volume:   19.30 ml 13.01 ml/m LA Vol (A4C):   27.2 ml 18.34 ml/m LA Biplane Vol: 31.7 ml 21.37 ml/m  AORTIC VALVE             PULMONIC VALVE LVOT Vmax:    90.70 cm/s  PR End Diast Vel: 1.44 msec LVOT Vmean:  59.500 cm/s LVOT VTI:    0.139 m  AORTA Ao Root diam: 2.70 cm Ao Asc diam:  2.80 cm MITRAL VALVE  TRICUSPID VALVE MV Area (PHT): 5.38 cm     TR Peak grad:   29.4 mmHg MV Decel Time: 141 msec     TR Vmax:        271.00 cm/s MV E velocity: 107.00 cm/s                             SHUNTS                             Systemic VTI:  0.14 m                             Systemic Diam: 2.30 cm Jenkins Rouge MD Electronically signed by Jenkins Rouge MD Signature Date/Time: 10/24/2022/10:37:09 AM    Final        Hosie Poisson M.D. [facility] 10/25/2022, 4:27 PM  Available via Epic secure chat 7am-7pm After 7 pm, please refer to night coverage provider listed on amion.

## 2022-10-25 NOTE — Consult Note (Addendum)
Consultation Note Date: 10/25/2022   Patient Name: Michele Burns  DOB: Jan 12, 1936  MRN: [id number]  Age / Sex: 87 y.o., female  PCP: [name], MD Referring Physician: Hosie Poisson, MD  Reason for Consultation: Establishing goals of care  HPI/Patient Profile: 87 y.o. female   admitted on 10/20/2022   87 year old lady with a past medical history significant for coronary artery disease status post RCA PCI in 2014, also has history of dementia TIAs hypertension and dyslipidemia.  Patient lives at [facility] independent living facility.  Patient has been admitted to hospital medicine service because of pneumonia that failed outpatient therapy.    Cardiology has also been consulted in this hospitalization because of her new diagnosis of systolic heart failure with surface echocardiogram showing left ventricular ejection fraction 25-30% apical segments akinetic or hypokinetic: Findings concerning for possible Takotsubo's versus LAD disease.    PT has seen and initial recommendation has been made for SNF rehab attempt however patient continues to have cognitive and functional decline and escalating symptom burden in this hospitalization hence, PMT consult has been requested for goals of care discussions.  Clinical Assessment and Goals of Care: Elderly appearing lady resting in bed.  When I say her first name, she is able to stir and open her eyes, however she does not verbalize does not follow commands she has a weak cough and appears congested.  No family present at bedside however call placed and discussed with son Michele Burns at [phone].  Palliative medicine is specialized medical care for people living with serious illness. It focuses on providing relief from the symptoms and stress of a serious illness. The goal is to improve quality of life for both the patient and the family.  Goals of care: Broad  aims of medical therapy in relation to the patient's values and preferences. Our aim is to provide medical care aimed at enabling patients to achieve the goals that matter most to them, given the circumstances of their particular medical situation and their constraints.   Chart reviewed, PMT consult received.  See discussions below. HCPOA  Son [place].   GOC/SUMMARY OF RECOMMENDATIONS   Call placed and I was able to reach the patient's son Michele Burns, he states that the patient has a DNR.  He states that the patient was living at independent living [place].  She has had acute decline.  We discussed about how this hospitalization is going.  We discussed about hospital course being complicated by new diagnosis of systolic heart failure with echo showing left ventricular ejection fraction 25-30% with initial differential diagnosis being Takotsubo's versus LAD disease.  Hence, goals wishes and values attempted to be explored.  Patient's son states that she has a DO NOT RESUSCITATE.  He is aware of ongoing decline.  He states that even though the initial recommendation has been made for skilled nursing facility for rehabilitation attempt, he states that he is not sure how the patient would be able to undergo physical therapy.    We discussed about patient's  current condition and the concept of symptom focused care/comfort-measures-only and addition of hospice support was also brought up.    Discussed extensively about differences between SNF rehab with palliative versus the type of care that can be provided inside a residential hospice.    PLAN is for a brief time-limited trial of current interventions for the next 24 to 48 hours and if the patient has ongoing decline and escalating symptom burden, then the plan will be to shift to comfort care and seek care inside a residential hospice facility.    PMT to continue to follow along.  Thank you for the consult.  Code Status/Advance Care  Planning: DNR   Symptom Management:     Palliative Prophylaxis:  Delirium Protocol   Psycho-social/Spiritual:  Desire for further Chaplaincy support:yes Additional Recommendations: Education on Hospice  Prognosis:  Guarded   Discharge Planning: to be determined: SNF rehab with palliative OR residential hospice.       Primary Diagnoses: Present on Admission:  CAP (community acquired pneumonia)  CAD (coronary artery disease)  Hyperlipidemia   I have reviewed the medical record, interviewed the patient and family, and examined the patient. The following aspects are pertinent.  Past Medical History:  Diagnosis Date   Cecal volvulus ([place]) 06/29/2014   Coronary artery disease    a. Cath 11/17/12 - severe Ca++ moderately severe disease in the mid RCA, otherwise nonobstructive disease - s/p PTCA/rotational atherectomy & 2 DES to mid RCA 11/22/12.   DEPRESSION    Erosive gastritis 2006   NSAID precipitated, resolved on f/u EGD   Hx of adenomatous colonic polyps 02/10/2015   Hx of colonic polyp 2006   diminutive '06, no polyps on 4/09 colo - for repeat 4/14   Hyperlipidemia    Hypertension    IBS (irritable bowel syndrome)    MIGRAINE HEADACHE    POSTMENOPAUSAL SYNDROME    PULMONARY NODULE    PVD    normal ABI/TBI 09/2009, chronic L toe pain and vasospasm ([place])   RENAL CYST    Sinus bradycardia    a. not on BB due to this.   Social History   Socioeconomic History   Marital status: Widowed    Spouse name: Not on file   Number of children: 3   Years of education: Not on file   Highest education level: Not on file  Occupational History   Occupation: [employer]    Employer: [employer]  Tobacco Use   Smoking status: Former    Years: 30.00    Types: Cigarettes    Quit date: 10/12/1980    Years since quitting: 42.0   Smokeless tobacco: Never   Tobacco comments:    She is widowed since 2002-she has 3 grown children and 5 g-kids. Moved to [place] from [place]  in 2010 to be close to family  Vaping Use   Vaping Use: Never used  Substance and Sexual Activity   Alcohol use: Yes    Alcohol/week: 10.0 standard drinks of alcohol    Types: 10 Glasses of wine per week    Comment: <2 /day, white wine twice daily   Drug use: No   Sexual activity: Not on file  Other Topics Concern   Not on file  Social History Narrative   Lives alone in a one story home. Husband passed away 15 year ago. Has 3 children.     Works part time in [employer] research (medical).     Education: 2 years of college.    Social  Determinants of Health   Financial Resource Strain: Not on file  Food Insecurity: Not on file  Transportation Needs: Not on file  Physical Activity: Not on file  Stress: Not on file  Social Connections: Not on file   Family History  Problem Relation Age of Onset   Heart attack Father 45       deceased   Heart attack Mother 61       deceased   Diabetes type II Mother    Diabetes Mother    Heart attack Brother 41       deceased   Diabetes Paternal Grandmother        IDDM   Healthy Son        x 2   Healthy Daughter    Colon cancer Neg Hx    Colon polyps Neg Hx    Kidney disease Neg Hx    Esophageal cancer Neg Hx    Gallbladder disease Neg Hx    Hypertension Neg Hx    Stroke Neg Hx    Scheduled Meds:  amoxicillin-clavulanate  1 tablet Oral Q12H   cyanocobalamin  1,000 mcg Intramuscular Daily   feeding supplement  1 Container Oral TID BM   folic acid  1 mg Oral Daily   furosemide  20 mg Oral Daily   guaiFENesin  600 mg Oral BID   multivitamin with minerals  1 tablet Oral Daily   polyethylene glycol  17 g Oral Daily   senna-docusate  2 tablet Oral BID   Continuous Infusions: PRN Meds:.acetaminophen **OR** acetaminophen, albuterol, traZODone Medications Prior to Admission:  Prior to Admission medications   Medication Sig Start Date End Date Taking? Authorizing Provider  albuterol (VENTOLIN HFA) 108 (90 Base) MCG/ACT inhaler Inhale 2 puffs into the lungs  every 6 (six) hours as needed for wheezing or shortness of breath.   Yes [provider]  aspirin EC 325 MG tablet Take 325 mg by mouth every 6 (six) hours as needed (headache).   Yes [provider]  aspirin EC 81 MG tablet Take 1 tablet (81 mg total) by mouth daily. Swallow whole. Patient taking differently: Take 81 mg by mouth daily as needed for moderate pain (headache). Swallow whole. 10/24/20  Yes Bhagat, Bhavinkumar, PA  levofloxacin (LEVAQUIN) 500 MG tablet Take 500 mg by mouth daily.   Yes [provider]  traZODone (DESYREL) 100 MG tablet Take 100 mg by mouth at bedtime as needed for sleep.   Yes [provider]  simvastatin (ZOCOR) 40 MG tablet TAKE 1 TABLET BY MOUTH EVERY NIGHT AT BEDTIME. Patient not taking: Reported on 10/20/2022 11/22/18   Richardson Dopp T, PA-C   Allergies  Allergen Reactions   Codeine Nausea Only   Cortisone Nausea Only   Review of Systems Has weak cough Physical Exam Confused, has weak cough Shallow breath sounds Appears debilitated Appears chronically ill Attempts to awaken to voice command, is not able to verbalize or give history No edema  Vital Signs: BP 121/83 (BP Location: Right Arm)   Pulse 78   Temp (!) 97.5 F (36.4 C) (Oral)   Resp 18   Ht '5\' 6"'$  (1.676 m)   Wt 44.9 kg   SpO2 100%   BMI 15.98 kg/m  Pain Scale: 0-10   Pain Score: 0-No pain   SpO2: SpO2: 100 % O2 Device:SpO2: 100 % O2 Flow Rate: .O2 Flow Rate (L/min): 3 L/min  IO: Intake/output summary:  Intake/Output Summary (Last 24 hours) at 10/25/2022 [id number]  Last data filed at 10/25/2022 3154 Gross per 24 hour  Intake 600 ml  Output 0 ml  Net 600 ml    LBM: Last BM Date : 10/19/22 Baseline Weight: Weight: 53.2 kg Most recent weight: Weight: 44.9 kg     Palliative Assessment/Data:   PPS 40%  Time In:  8 Time Out: 9.15 Time Total:75 Greater than 50%  of this time was spent counseling and coordinating care related to the above  assessment and plan.  Signed by: Loistine Chance, MD   Please contact Palliative Medicine Team phone at [phone] for questions and concerns.  For individual provider: See Shea Evans

## 2022-10-26 DIAGNOSIS — Z7189 Other specified counseling: Secondary | ICD-10-CM | POA: Diagnosis not present

## 2022-10-26 DIAGNOSIS — Z515 Encounter for palliative care: Secondary | ICD-10-CM | POA: Diagnosis not present

## 2022-10-26 DIAGNOSIS — R531 Weakness: Secondary | ICD-10-CM | POA: Diagnosis not present

## 2022-10-26 DIAGNOSIS — E785 Hyperlipidemia, unspecified: Secondary | ICD-10-CM | POA: Diagnosis not present

## 2022-10-26 DIAGNOSIS — E43 Unspecified severe protein-calorie malnutrition: Secondary | ICD-10-CM | POA: Diagnosis not present

## 2022-10-26 DIAGNOSIS — F0394 Unspecified dementia, unspecified severity, with anxiety: Secondary | ICD-10-CM | POA: Diagnosis not present

## 2022-10-26 DIAGNOSIS — J189 Pneumonia, unspecified organism: Secondary | ICD-10-CM | POA: Diagnosis not present

## 2022-10-26 LAB — VITAMIN D 25 HYDROXY (VIT D DEFICIENCY, FRACTURES)

## 2022-10-26 LAB — BASIC METABOLIC PANEL
Anion gap: 12 (ref 5–15)
BUN: 28 mg/dL — ABNORMAL HIGH (ref 8–23)
CO2: 24 mmol/L (ref 22–32)
Calcium: 9 mg/dL (ref 8.9–10.3)
Chloride: 110 mmol/L (ref 98–111)
Creatinine, Ser: 0.79 mg/dL (ref 0.44–1.00)
GFR, Estimated: >60 mL/min (ref >60)
Glucose, Bld: 94 mg/dL (ref 70–99)
Potassium: 4 mmol/L (ref 3.5–5.1)
Sodium: 146 mmol/L — ABNORMAL HIGH (ref 135–145)

## 2022-10-26 MED ORDER — ONDANSETRON 4 MG PO TBDP: 4.0000 mg | ORAL_TABLET | Freq: Three times a day (TID) | ORAL | Status: DC | PRN

## 2022-10-26 MED ORDER — LORAZEPAM 2 MG/ML PO CONC: 0.5000 mg | ORAL | Status: DC | PRN

## 2022-10-26 MED ORDER — ONDANSETRON HCL 4 MG/2ML IJ SOLN: 4.0000 mg | Freq: Once | INTRAMUSCULAR | Status: DC

## 2022-10-26 MED ORDER — AMOXICILLIN-POT CLAVULANATE 250-62.5 MG/5ML PO SUSR
500.0000 mg | Freq: Once | ORAL | Status: DC
  Filled 2022-10-26: qty 10

## 2022-10-26 MED ORDER — MORPHINE SULFATE (CONCENTRATE) 10 MG/0.5ML PO SOLN: 10.0000 mg | ORAL | Status: DC | PRN

## 2022-10-26 NOTE — TOC Progression Note (Signed)
Transition of Care [facility]) - Progression Note    Patient Details  Name: Michele Burns MRN: [id number] Date of Birth: [birth year]  [facility]) CM/SW Contact  Leeroy Cha, RN Phone Number: 10/26/2022, 12:14 PM  Clinical Narrative:    Referral for residential hospice in [place] sent to Nicholaus Corolla with [facility].   Expected Discharge Plan: [place] Barriers to Discharge: Continued Medical Work up  Expected Discharge Plan and Services In-house Referral: Hospice / Palliative Care Discharge Planning Services: CM Consult Post Acute Care Choice: Residential Hospice Bed Living arrangements for the past 2 months: [place] ([place], [place])                                       Social Determinants of Health (SDOH) Interventions SDOH Screenings   Tobacco Use: Medium Risk (10/20/2022)    Readmission Risk Interventions   No data to display

## 2022-10-26 NOTE — Progress Notes (Signed)
Daily Progress Note   Patient Name: Michele Burns       Date: 10/26/2022 DOB: 1936-01-13  Age: 87 y.o. MRN#: [id number] Attending Physician: Hosie Poisson, MD Primary Care Physician: [name], MD Admit Date: 10/20/2022  Reason for Consultation/Follow-up: Hospice Evaluation and Terminal Care  Subjective:  Agitated when awake, attempting to pull out pure wick catheter, patient seen earlier this am, I also returned to bedside when RN notified me that son and daughter in law were at bedside.   Goals of care discussed, also discussed with [facility] MD.   Length of Stay: 5  Current Medications: Scheduled Meds:   amoxicillin-clavulanate  500 mg Oral Q12H   cyanocobalamin  1,000 mcg Intramuscular Daily   feeding supplement  1 Container Oral TID BM   folic acid  1 mg Oral Daily   multivitamin  15 mL Oral Daily   polyethylene glycol  17 g Oral Daily    Continuous Infusions:   PRN Meds: acetaminophen **OR** acetaminophen, albuterol, furosemide, guaiFENesin-dextromethorphan, traZODone  Physical Exam         Awake and agitated earlier Confused Restless Shallow coarse breath sounds  Vital Signs: BP 138/89 (BP Location: Right Arm)   Pulse 70   Temp 97.8 F (36.6 C) (Oral)   Resp 15   Ht '5\' 6"'$  (1.676 m)   Wt 44.9 kg   SpO2 98%   BMI 15.98 kg/m  SpO2: SpO2: 98 % O2 Device: O2 Device: Nasal Cannula O2 Flow Rate: O2 Flow Rate (L/min): 3 L/min  Intake/output summary:  Intake/Output Summary (Last 24 hours) at 10/26/2022 1204 Last data filed at 10/26/2022 1000 Gross per 24 hour  Intake 0 ml  Output 200 ml  Net -200 ml   LBM: Last BM Date : 10/24/22 Baseline Weight: Weight: 53.2 kg Most recent weight: Weight: 44.9 kg       Palliative Assessment/Data:      Patient  Active Problem List   Diagnosis Date Noted   Protein-calorie malnutrition, severe 10/22/2022   CAP (community acquired pneumonia) 10/20/2022   Dementia ([place]) 10/20/2022   Chronic insomnia 01/21/2015   Low serum vitamin B12 12/06/2014   Anemia 12/03/2014   Tremor    Atherosclerosis of native arteries of the extremities with ulceration(440.23) 01/02/2014   CAD (coronary artery disease) 11/18/2012   Depression 12/25/2010  IRRITABLE BOWEL SYNDROME 10/08/2010   PULMONARY NODULE 08/14/2010   PVD 12/27/2009   MIGRAINE HEADACHE 11/12/2009   RENAL CYST 11/12/2009   CARDIAC MURMUR 09/23/2009   Hyperlipidemia 09/20/2009   Essential hypertension 09/20/2009    Palliative Care Assessment & Plan   Patient Profile:    Assessment:  87 year old lady with a past medical history significant for coronary artery disease status post RCA PCI in 2014, also has history of dementia TIAs hypertension and dyslipidemia.  Patient lives at [facility] facility.  Patient has been admitted to hospital medicine service because of pneumonia that failed outpatient therapy.     Cardiology has also been consulted in this hospitalization because of her new diagnosis of systolic heart failure with surface echocardiogram showing left ventricular ejection fraction 25-30% apical segments akinetic or hypokinetic: Findings concerning for possible Takotsubo's versus LAD disease.     PT has seen and initial recommendation has been made for SNF rehab attempt however patient continues to have cognitive and functional decline and escalating symptom burden in this hospitalization hence, PMT consult has been requested for goals of care discussions.  Recommendations/Plan: Goals of care discussions undertaken with the patient's son [name] who arrived at bedside along with son Karen Kitchens and his wife, patient's daughter-in-law.  Also joined by Dr. Karleen Hampshire from [facility] hospital medicine service for further goals of care  discussions.  Discussed with bedside RN Helene Kelp.  Patient is agitated when awake.  She has minimal oral intake.  She has coarse breath sounds and congestion.  Overall, patient has high likelihood of ongoing decline and decompensation.  Son states that his goals for her are to have dignity and to avoid pain and suffering.  We discussed about hospice philosophy of care-residential hospice.  Will request TOC assistance for facilitation of residential hospice evaluation.  PMT to follow.  Goals of Care and Additional Recommendations: Limitations on Scope of Treatment: Full Comfort Care  Code Status:    Code Status Orders  (From admission, onward)           Start     Ordered   10/20/22 1745  Do not attempt resuscitation (DNR)  Continuous       Question Answer Comment  If patient has no pulse and is not breathing Do Not Attempt Resuscitation   If patient has a pulse and/or is breathing: Medical Treatment Goals LIMITED ADDITIONAL INTERVENTIONS: Use medication/IV fluids and cardiac monitoring as indicated; Do not use intubation or mechanical ventilation (DNI), also provide comfort medications.  Transfer to Progressive/Stepdown as indicated, avoid Intensive Care.   Consent: Discussion documented in EHR or advanced directives reviewed      10/20/22 1745           Code Status History     This patient has a current code status but no historical code status.      Advance Directive Documentation    Flowsheet Row Most Recent Value  Type of Advance Directive Healthcare Power of Attorney, Living will  Pre-existing out of facility DNR order (yellow form or pink MOST form) --  "MOST" Form in Place? --       Prognosis:  < 2 weeks  Discharge Planning: Hospice facility  Care plan was discussed with patient, son daughter in law, RN, [facility] MD.   Thank you for allowing the Palliative Medicine Team to assist in the care of this patient.  High MDM       Greater than 50%  of this time was  spent  counseling and coordinating care related to the above assessment and plan.  Loistine Chance, MD  Please contact Palliative Medicine Team phone at [phone] for questions and concerns.

## 2022-10-26 NOTE — Progress Notes (Signed)
[facility] Insurance risk surveyor [facility]) Hospital Liaison Note  Received request from [name], [facility] for family interest in the Hospice Home. Spoke with patient's son, [name], to confirm interest and explain services. Eligibility confirmed per [facility] MD. Per family, they requested that we follow up tomorrow to see if bed is available.   Please do not hesitate to call with any questions.    Thank you, Zigmund Gottron RN  [facility] Liaison [phone]

## 2022-10-26 NOTE — Progress Notes (Signed)
PT Cancellation Note  Patient Details Name: Michele Burns MRN: [id number] DOB: 1936/01/21   Cancelled Treatment:    Reason Eval/Treat Not Completed: Other (comment); plan is for comfort care; PT signing off    [facility] 10/26/2022, 1:12 PM

## 2022-10-26 NOTE — Progress Notes (Signed)
[facility]                                                                               Michele Burns, is a 87 y.o. female, DOB - 08-05-1936, [id number] Admit date - 10/20/2022    Outpatient Primary MD for the patient is [name], MD  LOS - 5  days    Brief summary   Michele Burns is a 87 y.o. female with medical history significant of dementia, CAD, HLD. Presenting with shortness of breath and cough. She has had symptoms for the last week. She was seen by her PCP and diagnosed with PNA. She was given a course of abx; however, after completeing them, she is not feeling better. She was admitted for further evaluation of the CAP.  She was started on IV antibiotics  changed to oral antibiotics as she continued to remove the IV lines.    Assessment & Plan    Assessment and Plan:  Community Acquired pneumonia:  She was started on IV Ceftriaxone and IV Azithromycin, transitioned to oral antibiotics.  D/c antibiotics after today's dose.  Currently she is requiring up to 3 L of nasal cannula oxygen.  Continue with cough medication and nebulizers as needed. Unable to wean her off oxygen. Repeat CXR shows bilateral effusions.  US thoracentesis done and 900 ml of clear fluid taken out.  Pleural fluid sent for analysis so far no signs of infection at this time.   Dementia No agitation.     Newly diagnosed CHF:  LVef is about 25% to 30%. Left ventricle has severely decreased function. The left ventricle demonstrates regional wall abn. There is moderate asymmetric left ventricular hypertrophy of the basal and septal segments.  Left ventricular diastolic parameters are indeterminate. Right ventricular systolic function is normal  Continue with strict intake and output and daily weights.  Started her on Lasix 20 mg daily prn. . Cardiology consulted, recommendations given. No further work up at this time.    Hyperlipidemia -Statin on hold at this  time.   Therapy eval recommending SNF. But in view of her clinical decline, reaching end of like, family would like to focus on comfort at this time. They would like to watch her on the management for another 1 to 2 days and if no clinical improvement, transition to comfort measures.  Meanwhile hospice home referral sent .  Currently waiting for the TOC.    Protein calorie malnutrition/ severe malnutrition.    Estimated body mass index is 15.98 kg/m as calculated from the following:   Height as of this encounter: '5\' 6"'$  (1.676 m).   Weight as of this encounter: 44.9 kg.  Code Status: DNR.  DVT Prophylaxis:  SCDs Start: 10/20/22 2037   Level of Care: Level of care: Med-Surg Family Communication: none at bedside.   Disposition Plan:     Remains inpatient appropriate:  progressive decline, monitor for 48 hours and decide for SNF vs RESIDENTIAL HOSPICE.  Procedures:  None.  Consultants:   Palliative care  Cardiology.   Antimicrobials:   Anti-infectives (From admission, onward)    Start     Dose/Rate Route Frequency Ordered Stop  10/25/22 1545  amoxicillin-clavulanate (AUGMENTIN) 250-62.5 MG/5ML suspension 500 mg        500 mg Oral Every 12 hours 10/25/22 1449     10/22/22 2200  amoxicillin-clavulanate (AUGMENTIN) 875-125 MG per tablet 1 tablet  Status:  Discontinued        1 tablet Oral Every 12 hours 10/22/22 1613 10/25/22 1449   10/20/22 1445  azithromycin (ZITHROMAX) 500 mg in sodium chloride 0.9 % 250 mL IVPB  Status:  Discontinued        500 mg 250 mL/hr over 60 Minutes Intravenous Every 24 hours 10/20/22 1432 10/22/22 1613   10/20/22 1445  cefTRIAXone (ROCEPHIN) 1 g in sodium chloride 0.9 % 100 mL IVPB  Status:  Discontinued        1 g 200 mL/hr over 30 Minutes Intravenous Every 24 hours 10/20/22 1432 10/22/22 1613        Medications  Scheduled Meds:  amoxicillin-clavulanate  500 mg Oral Q12H   cyanocobalamin  1,000 mcg Intramuscular Daily   feeding supplement   1 Container Oral TID BM   folic acid  1 mg Oral Daily   multivitamin  15 mL Oral Daily   polyethylene glycol  17 g Oral Daily   Continuous Infusions:   PRN Meds:.acetaminophen **OR** acetaminophen, albuterol, furosemide, guaiFENesin-dextromethorphan, traZODone    Subjective:   Era Parr was seen and examined today. Pt comfortable, not in distress.   Objective:   Vitals:   10/25/22 1319 10/25/22 2106 10/26/22 0441 10/26/22 1343  BP: 131/87 (!) 133/96 138/89 (!) 138/94  Pulse: 73 71 70 75  Resp: '14 16 15 18  '$ Temp: (!) 97.5 F (36.4 C) 97.6 F (36.4 C) 97.8 F (36.6 C) (!) 97.4 F (36.3 C)  TempSrc: Oral Oral Oral Oral  SpO2: 99% 98% 98% 96%  Weight:      Height:        Intake/Output Summary (Last 24 hours) at 10/26/2022 1640 Last data filed at 10/26/2022 1400 Gross per 24 hour  Intake 60 ml  Output 200 ml  Net -140 ml    Filed Weights   10/20/22 1445 10/20/22 2135  Weight: 53.2 kg 44.9 kg     Exam General exam: Elderly woman, not in distress.  Respiratory system: diminished air entry at bases, on 3 lit of [place] oxygen.  Cardiovascular system: S1 & S2 heard, RRR. No JVD No pedal edema. Gastrointestinal system: Abdomen is nondistended, soft and nontender. Central nervous system: Alert and oriented to person.  Extremities: no pedal edema.  Skin: No rashes,  Psychiatry: flat affect.       Data Reviewed:  I have personally reviewed following labs and imaging studies   CBC Lab Results  Component Value Date   WBC 9.7 10/24/2022   RBC 4.46 10/24/2022   HGB 13.3 10/24/2022   HCT 40.4 10/24/2022   MCV 90.6 10/24/2022   MCH 29.8 10/24/2022   PLT 202 10/24/2022   MCHC 32.9 10/24/2022   RDW 14.1 10/24/2022   LYMPHSABS 1.0 10/24/2022   MONOABS 0.7 10/24/2022   EOSABS 0.7 (H) 10/24/2022   BASOSABS 0.1 22/97/9892     Last metabolic panel Lab Results  Component Value Date   NA 146 (H) 10/26/2022   K 4.0 10/26/2022   CL 110 10/26/2022   CO2 24  10/26/2022   BUN 28 (H) 10/26/2022   CREATININE 0.79 10/26/2022   GLUCOSE 94 10/26/2022   GFRNONAA >60 10/26/2022   GFRAA 60 08/26/2020   CALCIUM 9.0 10/26/2022  PROT 5.4 (L) 10/21/2022   ALBUMIN 2.4 (L) 10/21/2022   BILITOT 0.8 10/21/2022   ALKPHOS 59 10/21/2022   AST 17 10/21/2022   ALT 12 10/21/2022   ANIONGAP 12 10/26/2022    CBG (last 3)  No results for input(s): "GLUCAP" in the last 72 hours.    Coagulation Profile: Recent Labs  Lab 10/20/22 1519  INR 1.1      Radiology Studies: No results found.     Hosie Poisson M.D. [facility] 10/26/2022, 4:40 PM  Available via Epic secure chat 7am-7pm After 7 pm, please refer to night coverage provider listed on amion.

## 2022-10-26 NOTE — Progress Notes (Signed)
Patient had an IV place because I attempted to give her water and she was unable to follow instructions to swallow. She has a lot of secretions and is unable to cough them up. Attempted to suction patient but nothing came up. NP Olena Heckle was contacted to switch PO Morphine, Ativan, and Zofran to IV if needed.

## 2022-10-27 DIAGNOSIS — E785 Hyperlipidemia, unspecified: Secondary | ICD-10-CM | POA: Diagnosis not present

## 2022-10-27 DIAGNOSIS — Z515 Encounter for palliative care: Secondary | ICD-10-CM | POA: Diagnosis not present

## 2022-10-27 DIAGNOSIS — R451 Restlessness and agitation: Secondary | ICD-10-CM

## 2022-10-27 DIAGNOSIS — R0600 Dyspnea, unspecified: Secondary | ICD-10-CM

## 2022-10-27 DIAGNOSIS — J189 Pneumonia, unspecified organism: Secondary | ICD-10-CM | POA: Diagnosis not present

## 2022-10-27 DIAGNOSIS — Z79899 Other long term (current) drug therapy: Secondary | ICD-10-CM

## 2022-10-27 DIAGNOSIS — E43 Unspecified severe protein-calorie malnutrition: Secondary | ICD-10-CM | POA: Diagnosis not present

## 2022-10-27 DIAGNOSIS — Z7189 Other specified counseling: Secondary | ICD-10-CM

## 2022-10-27 DIAGNOSIS — F0394 Unspecified dementia, unspecified severity, with anxiety: Secondary | ICD-10-CM | POA: Diagnosis not present

## 2022-10-27 MED ORDER — SCOPOLAMINE 1 MG/3DAYS TD PT72
1.0000 | MEDICATED_PATCH | TRANSDERMAL | Status: DC
  Administered 2022-10-27: 1.5 mg via TRANSDERMAL
  Filled 2022-10-27: qty 1

## 2022-10-27 MED ORDER — MORPHINE SULFATE (PF) 2 MG/ML IV SOLN
1.0000 mg | INTRAVENOUS | Status: DC | PRN
  Administered 2022-10-27: 1 mg via INTRAVENOUS
  Filled 2022-10-27: qty 1

## 2022-10-27 NOTE — Progress Notes (Signed)
[facility] Insurance risk surveyor [facility]) Hospital Liaison Note   Spoke with patient's son, [name], to follow up about accepting a bed at the hospice home. Eligibility has been confirmed per [facility] MD. Per family, they requested that we follow up again tomorrow to see if bed is available.    Please do not hesitate to call with any questions.     Thank you, Zigmund Gottron RN  [facility] Liaison [phone]

## 2022-10-27 NOTE — Progress Notes (Signed)
[facility]                                                                               Michele Burns, is a 87 y.o. female, DOB - [birth year], NUU:[id number] Admit date - 10/20/2022    Outpatient Primary MD for the patient is [name], MD  LOS - 6  days    Brief summary   Michele Burns is a 87 y.o. female with medical history significant of dementia, CAD, HLD. Presenting with shortness of breath and cough. She has had symptoms for the last week. She was seen by her PCP and diagnosed with PNA. She was given a course of abx; however, after completeing them, she is not feeling better. She was admitted for further evaluation of the CAP.  She was started on IV antibiotics  changed to oral antibiotics as she continued to remove the IV lines. Patient continues to have progressive decline, at which point, palliative care consulted and she is transitioned to comfort measures and focus on comfort. Family would like to transition to residential hospice.  This morning patient was choking on food and meds. She is having hard time with secretions and breathing.  Palliative on board and assisting with symptomatic management.    Assessment & Plan    Assessment and Plan:  Community Acquired pneumonia/ Bilateral pleural effusions.  She completed the course of antibiotics. S/p US thoracentesis and 900 ml of clear fluid taken out.  Currently she is requiring up to 3 L of nasal cannula oxygen.  Continue with cough medication and nebulizers as needed. Unable to wean her off oxygen.  Symptomatic management.    Dementia No agitation.    Newly diagnosed CHF:  Echocardiogram showing LVEF is about 25% to 30%. Left ventricle has severely decreased function. The left ventricle demonstrates regional wall abn. There is moderate asymmetric left ventricular hypertrophy of the basal and septal segments. Left ventricular diastolic parameters are indeterminate. Right ventricular systolic  function is normal  Started her on Lasix 20 mg daily prn. . Cardiology consulted, recommendations given. No further work up at this time.    Hyperlipidemia -Statin on hold at this time.   Therapy eval recommending SNF. But in view of her clinical decline, reaching end of like, family would like to focus on comfort at this time. Transition to comfort measures due to progressive decline. Choking on meds and food. Family requesting for residential hospice placement. TOC on board.  Palliative assisting with symptom management for increased secretions.    Protein calorie malnutrition/ severe malnutrition.    Estimated body mass index is 15.98 kg/m as calculated from the following:   Height as of this encounter: '5\' 6"'$  (1.676 m).   Weight as of this encounter: 44.9 kg.  Code Status: DNR.  DVT Prophylaxis:  comfort    Level of Care: Level of care: Med-Surg Family Communication: none at bedside.   Disposition Plan:     Remains inpatient appropriate: residential hospice placement.   Procedures:  Echo. US thoracentesis.   Consultants:   Palliative care  Cardiology.   Antimicrobials:   Anti-infectives (From admission, onward)    Start  Dose/Rate Route Frequency Ordered Stop   10/26/22 1900  amoxicillin-clavulanate (AUGMENTIN) 250-62.5 MG/5ML suspension 500 mg  Status:  Discontinued        500 mg Oral  Once 10/26/22 1645 10/27/22 1006   10/25/22 1545  amoxicillin-clavulanate (AUGMENTIN) 250-62.5 MG/5ML suspension 500 mg  Status:  Discontinued        500 mg Oral Every 12 hours 10/25/22 1449 10/26/22 1645   10/22/22 2200  amoxicillin-clavulanate (AUGMENTIN) 875-125 MG per tablet 1 tablet  Status:  Discontinued        1 tablet Oral Every 12 hours 10/22/22 1613 10/25/22 1449   10/20/22 1445  azithromycin (ZITHROMAX) 500 mg in sodium chloride 0.9 % 250 mL IVPB  Status:  Discontinued        500 mg 250 mL/hr over 60 Minutes Intravenous Every 24 hours 10/20/22 1432 10/22/22 1613    10/20/22 1445  cefTRIAXone (ROCEPHIN) 1 g in sodium chloride 0.9 % 100 mL IVPB  Status:  Discontinued        1 g 200 mL/hr over 30 Minutes Intravenous Every 24 hours 10/20/22 1432 10/22/22 1613        Medications  Scheduled Meds:  ondansetron (ZOFRAN) IV  4 mg Intravenous Once   scopolamine  1 patch Transdermal Q72H   Continuous Infusions:   PRN Meds:.acetaminophen **OR** acetaminophen, albuterol, furosemide, LORazepam, morphine injection, morphine CONCENTRATE, ondansetron    Subjective:   Michele Burns was seen and examined today. Tachypnea, raspy voice. Weak.   Objective:   Vitals:   10/26/22 1343 10/26/22 2213 10/27/22 0516 10/27/22 0611  BP: (!) 138/94 (!) 142/114 (!) 146/96   Pulse: 75 (!) 102 100   Resp: '18 18 18   '$ Temp: (!) 97.4 F (36.3 C) 97.7 F (36.5 C) (!) 97.5 F (36.4 C)   TempSrc: Oral Oral Oral   SpO2: 96% 96% (!) 88% 94%  Weight:      Height:        Intake/Output Summary (Last 24 hours) at 10/27/2022 1114 Last data filed at 10/27/2022 1000 Gross per 24 hour  Intake 120 ml  Output 0 ml  Net 120 ml    Filed Weights   10/20/22 1445 10/20/22 2135  Weight: 53.2 kg 44.9 kg     Exam General exam: elderly woman , ill appearing, declining.  Respiratory system: Diminished air entry.  On nasal cannula oxygen Cardiovascular system: S1 & S2 heard, RRR.  Gastrointestinal system: Abdomen is soft, nt.  Central nervous system: Alert and confused.  Extremities: No pedal edema.  Skin: No rashes,  Psychiatry: unable to assess. .        Data Reviewed:  I have personally reviewed following labs and imaging studies   CBC Lab Results  Component Value Date   WBC 9.7 10/24/2022   RBC 4.46 10/24/2022   HGB 13.3 10/24/2022   HCT 40.4 10/24/2022   MCV 90.6 10/24/2022   MCH 29.8 10/24/2022   PLT 202 10/24/2022   MCHC 32.9 10/24/2022   RDW 14.1 10/24/2022   LYMPHSABS 1.0 10/24/2022   MONOABS 0.7 10/24/2022   EOSABS 0.7 (H) 10/24/2022    BASOSABS 0.1 28/41/3244     Last metabolic panel Lab Results  Component Value Date   NA 146 (H) 10/26/2022   K 4.0 10/26/2022   CL 110 10/26/2022   CO2 24 10/26/2022   BUN 28 (H) 10/26/2022   CREATININE 0.79 10/26/2022   GLUCOSE 94 10/26/2022   GFRNONAA >60 10/26/2022   GFRAA 60 08/26/2020  CALCIUM 9.0 10/26/2022   PROT 5.4 (L) 10/21/2022   ALBUMIN 2.4 (L) 10/21/2022   BILITOT 0.8 10/21/2022   ALKPHOS 59 10/21/2022   AST 17 10/21/2022   ALT 12 10/21/2022   ANIONGAP 12 10/26/2022    CBG (last 3)  No results for input(s): "GLUCAP" in the last 72 hours.    Coagulation Profile: Recent Labs  Lab 10/20/22 1519  INR 1.1      Radiology Studies: No results found.     Hosie Poisson M.D. [facility] 10/27/2022, 11:14 AM  Available via Epic secure chat 7am-7pm After 7 pm, please refer to night coverage provider listed on amion.

## 2022-10-27 NOTE — Progress Notes (Signed)
Daily Progress Note   Patient Name: Michele Burns       Date: 10/27/2022 DOB: 07/18/1936  Age: 87 y.o. MRN#: [id number] Attending Physician: Hosie Poisson, MD Primary Care Physician: [name], MD Admit Date: 10/20/2022 Length of Stay: 6 days  Reason for Consultation/Follow-up: Terminal Care  Subjective:   CC: Patient seen laying in bed.  Patient unable to participate in complex medical decision making due to medical status.  Was transition to comfort focused care on 10/26/2022.  Following up regarding symptom management.  Subjective:  Reviewed EMR prior to seeing patient.  Patient has not received any PRNs overnight.  Nursing from overnight noted that patient was unable to follow instructions to swallow and a provider was contacted regarding transitioning to IV medications.  Resented to bedside this a.m.  Patient seen laying in bed.  No family at bedside during visit.  Patient awake though unable to participate in any discussion.  Only mumbling and coughing at times.  Patient noted to have increased work of breathing with accessory muscle use.  Patient having copious secretions as well.  Discussed with RN who will provide IV morphine to assist with dyspnea management.  Discussed care with hospitalist as well.  Plan is for patient to transition to inpatient hospice when bed available.  Review of Systems Unable to obtain due to patient's mental status. Objective:   Vital Signs:  BP (!) 146/96 (BP Location: Right Arm)   Pulse 100   Temp (!) 97.5 F (36.4 C) (Oral)   Resp 18   Ht '5\' 6"'$  (1.676 m)   Wt 44.9 kg   SpO2 94%   BMI 15.98 kg/m   Physical Exam: General: Awake, cachectic, mumbling incoherently Eyes: No discharge noted HENT: moist mucous membranes with copious secretions noted Cardiovascular: RRR  Respiratory: increased work of breathing noted with use of accessory muscles present Abdomen: not distended Skin: no rashes or lesions on visible skin Neuro: Not able  to follow commands  Imaging:  I personally reviewed recent imaging.   Assessment & Plan:   Assessment: Patient is an 87 year old female with a past medical history of CAD status post PCI, dementia, TIAs, hypertension, and dyslipidemia who was admitted on 10/20/2022 for management of pneumonia that failed outpatient therapy.  Patient has continued to have a decline in medical, functional, and mental status during hospitalization.  As per discussions with palliative medicine team and hospitalist on 10/26/2022, patient was transitioned to comfort focused care.  Recommendations/Plan: # Complex medical decision making/goals of care:  - As per discussions between palliative medicine provider and hospitalist with patient's son in daughter-in-law on 10/26/2022, goal was transition to focusing on patient's comfort at the end of life.  Son stated his goal was for patient to "have dignity and to avoid pain and suffering".  Currently awaiting transfer to inpatient hospice via [facility].  -  Code Status: DNR  # Symptom management:  Patient having difficulty with swallowing at this point; excess secretions already noted.  Transitioning to IV medications.   -Dyspnea/pain, in the setting of end-of-life care   -Start IV morphine 1 mg every hour as needed dyspnea or pain.  Continue to adjust based on patient's symptom response.   -Agitation/anxiety, in the setting of end-of-life care   -Continue Ativan 0.5 mg every 4 hours as needed anxiety or agitation.  Continue to adjust based on patient's symptom response.   -Secretions   -Ordered for placement of scopolamine patch every 72 hours  # Psychosocial Support:  -Son,  daughter-in-law  # Discharge Planning: Awaiting transfer to inpatient hospice via [facility].  Discussed with: Hospitalist, bedside RN  Thank you for allowing the palliative care team to participate in the care [name].  Chelsea Aus, DO Palliative Care Provider PMT # [phone]  If  patient remains symptomatic despite maximum doses, please call PMT at [phone] between 0700 and 1900. Outside of these hours, please call attending, as PMT does not have night coverage.

## 2022-10-27 NOTE — Care Management Important Message (Signed)
Important Message  Patient Details No IM Letter given awaiting a Hospice bed. Name: Michele Burns MRN: [id number] Date of Birth: [birth year]   Medicare Important Message Given:  No     Kerin Salen 10/27/2022, 10:06 AM

## 2022-10-28 DIAGNOSIS — J189 Pneumonia, unspecified organism: Secondary | ICD-10-CM | POA: Diagnosis not present

## 2022-10-28 LAB — CYTOLOGY - NON PAP

## 2022-10-28 NOTE — Discharge Summary (Signed)
Physician Discharge Summary   Patient: Michele Burns MRN: [id number] DOB: 12-31-1935  Admit date:     10/20/2022  Discharge date: 10/28/22  Discharge Physician: Cordelia Poche   PCP: [name], MD   Recommendations at discharge:  Comfort measures  Discharge Diagnoses: Principal Problem:   CAP (community acquired pneumonia) Active Problems:   Hyperlipidemia   CAD (coronary artery disease)   Dementia ([place])   Protein-calorie malnutrition, severe   Palliative care encounter   Goals of care, counseling/discussion   Dyspnea   Agitation   High risk medication use  Resolved Problems:   * No resolved hospital problems. *  Hospital Course: Michele Burns is a 87 y.o. female with a history of dementia, CAD, hyperlipidemia. Patient presented secondary to shortness of breath and cough and was found to have multifocal pneumonia.  Patient treated empirically with ceftriaxone and azithromycin with transition to Augmentin.  During admission, patient was found to have bilateral pleural effusions and associated newly diagnosed acute systolic heart failure with left ventricular EF of 25 to 30%.  Cardiology was consulted and recommended for comfort measures.  Patient transition to full comfort measures with discharge to hospice facility.  Assessment and Plan:  Community acquired pneumonia Chest CT imaging consistent with multifocal pneumonia with associated multifocal pulmonary nodules/ground-glass densities of unclear significance (inflammatory vs infections). Patient managed empirically on Ceftriaxone/Azithromycin and transitioned to Augmentin to complete a 5-day total course of antibiotics.  Bilateral pleural effusions Likely secondary to acute heart failure. Patient managed on diuretic therapy. Patient on oxygen therapy.  Dementia Noted.  Acute systolic heart failure Newly diagnosed this admission. Transthoracic Echocardiogram significant for LVEF of 25-30% with associated  regional wall motion abnormalities. Cardiology consulted and per their assessment, heart failure likely secondary to Takostubo cardiomyopathy secondary to pneumonia. Recommendation for deferring invasive workup and instead focus on symptom management. Patient managed on Lasix and switched to Lasix PRN.  Hyperlipidemia Patient is on simvastatin as an outpatient. Discontinued on discharge as patient has transitioned to full comfort measures and hospice care.  Pulmonary nodules/ground glass opacities Patient is transitioned to hospice. No workup recommended.  Underweight Estimated body mass index is 15.98 kg/m as calculated from the following:   Height as of this encounter: '5\' 6"'$  (1.676 m).   Weight as of this encounter: 44.9 kg.    Consultants: Cardiology. Palliative care medicine Procedures performed:  1/13: Transthoracic Echocardiogram   Disposition: Hospice care Diet recommendation: Regular diet  DISCHARGE MEDICATION: Allergies as of 10/28/2022       Reactions   Codeine Nausea Only   Cortisone Nausea Only        Medication List     STOP taking these medications    albuterol 108 (90 Base) MCG/ACT inhaler Commonly known as: VENTOLIN HFA   aspirin EC 325 MG tablet   aspirin EC 81 MG tablet   levofloxacin 500 MG tablet Commonly known as: LEVAQUIN   simvastatin 40 MG tablet Commonly known as: ZOCOR   traZODone 100 MG tablet Commonly known as: [place]        Discharge Exam: BP 118/85 (BP Location: Right Arm)   Pulse 62   Temp 97.6 F (36.4 C) (Oral)   Resp 19   Ht '5\' 6"'$  (1.676 m)   Wt 44.9 kg   SpO2 98%   BMI 15.98 kg/m   General exam: Appears calm and comfortable Respiratory system: Respiratory effort normal. Cardiovascular system: S1 & S2 heard, RRR. Gastrointestinal system: Abdomen is nondistended, soft and nontender.  Normal bowel sounds heard.  Condition at discharge:  Comfort measures  The results of significant diagnostics from this  hospitalization (including imaging, microbiology, ancillary and laboratory) are listed below for reference.   Imaging Studies: ECHOCARDIOGRAM COMPLETE  Result Date: 10/24/2022    ECHOCARDIOGRAM REPORT   Patient Name:   Michele Burns [facility] Date of Exam: 10/24/2022 Medical Rec #:  [id number]         Height:       66.0 in Accession #:    [id number]        Weight:       99.0 lb Date of Birth:  [birth year]         BSA:          1.483 m Patient Age:    73 years          BP:           96/68 mmHg Patient Gender: F                 HR:           71 bpm. Exam Location:  Inpatient Procedure: 2D Echo, Cardiac Doppler and Color Doppler Indications:    R06.02 SOB  History:        Patient has prior history of Echocardiogram examinations, most                 recent 10/09/2009. CAD, Signs/Symptoms:Alzheimer's, Altered                 Mental Status and Murmur; Risk Factors:Dyslipidemia and                 Hypertension.  Sonographer:    Roseanna Rainbow RDCS Referring Phys: [id number] [facility]  Sonographer Comments: Technically difficult study due to poor echo windows. Image acquisition challenging due to uncooperative patient. Patient with ams, moving througout study. Patient pulled off ekg sticker during echo. Patient has no IV access, unable  to perform Definity study. Patient with extremely small habitus. IMPRESSIONS  1. Left ventricular ejection fraction, by estimation, is 25 to 30%. The left ventricle has severely decreased function. The left ventricle demonstrates regional wall motion abnormalities (see scoring diagram/findings for description). The left ventricular internal cavity size was moderately dilated. There is moderate asymmetric left ventricular hypertrophy of the basal and septal segments. Left ventricular diastolic parameters are indeterminate.  2. Right ventricular systolic function is normal. The right ventricular size is normal. There is mildly elevated pulmonary artery systolic pressure.  3. The mitral valve is  degenerative. No evidence of mitral valve regurgitation. No evidence of mitral stenosis.  4. The aortic valve is tricuspid. There is moderate calcification of the aortic valve. There is moderate thickening of the aortic valve. Aortic valve regurgitation is mild. Aortic valve sclerosis/calcification is present, without any evidence of aortic stenosis.  5. The inferior vena cava is normal in size with greater than 50% respiratory variability, suggesting right atrial pressure of 3 mmHg. FINDINGS  Left Ventricle: Septal apical akinesis Infeior apex distal anterio wall hypokinetic May be consistent with LAD infarct vs Takatsubo DCM No mural thrombus prominent apical trabeculations. Left ventricular ejection fraction, by estimation, is 25 to 30%. The left ventricle has severely decreased function. The left ventricle demonstrates regional wall motion abnormalities. The left ventricular internal cavity size was moderately dilated. There is moderate asymmetric left ventricular hypertrophy of the basal and septal segments. Left ventricular diastolic parameters are indeterminate. Right Ventricle: The right ventricular size is normal. No  increase in right ventricular wall thickness. Right ventricular systolic function is normal. There is mildly elevated pulmonary artery systolic pressure. The tricuspid regurgitant velocity is 2.71  m/s, and with an assumed right atrial pressure of 8 mmHg, the estimated right ventricular systolic pressure is 01.0 mmHg. Left Atrium: Left atrial size was normal in size. Right Atrium: Right atrial size was normal in size. Pericardium: There is no evidence of pericardial effusion. Mitral Valve: The mitral valve is degenerative in appearance. There is mild thickening of the mitral valve leaflet(s). There is mild calcification of the mitral valve leaflet(s). Mild mitral annular calcification. No evidence of mitral valve regurgitation. No evidence of mitral valve stenosis. Tricuspid Valve: The  tricuspid valve is normal in structure. Tricuspid valve regurgitation is mild . No evidence of tricuspid stenosis. Aortic Valve: The aortic valve is tricuspid. There is moderate calcification of the aortic valve. There is moderate thickening of the aortic valve. Aortic valve regurgitation is mild. Aortic valve sclerosis/calcification is present, without any evidence of aortic stenosis. Pulmonic Valve: The pulmonic valve was normal in structure. Pulmonic valve regurgitation is not visualized. No evidence of pulmonic stenosis. Aorta: The aortic root is normal in size and structure. Venous: The inferior vena cava is normal in size with greater than 50% respiratory variability, suggesting right atrial pressure of 3 mmHg. IAS/Shunts: No atrial level shunt detected by color flow Doppler.  LEFT VENTRICLE PLAX 2D LVIDd:         3.00 cm LVIDs:         2.40 cm LV PW:         0.80 cm LV IVS:        1.30 cm LVOT diam:     2.30 cm LV SV:         58 LV SV Index:   39 LVOT Area:     4.15 cm  LV Volumes (MOD) LV vol d, MOD A2C: 57.0 ml LV vol d, MOD A4C: 58.9 ml LV vol s, MOD A2C: 41.1 ml LV vol s, MOD A4C: 37.2 ml LV SV MOD A2C:     15.9 ml LV SV MOD A4C:     58.9 ml LV SV MOD BP:      18.7 ml RIGHT VENTRICLE            IVC RV S prime:     7.83 cm/s  IVC diam: 2.10 cm TAPSE (M-mode): 0.8 cm LEFT ATRIUM             Index        RIGHT ATRIUM          Index LA diam:        3.60 cm 2.43 cm/m   RA Area:     8.75 cm LA Vol (A2C):   30.8 ml 20.77 ml/m  RA Volume:   19.30 ml 13.01 ml/m LA Vol (A4C):   27.2 ml 18.34 ml/m LA Biplane Vol: 31.7 ml 21.37 ml/m  AORTIC VALVE             PULMONIC VALVE LVOT Vmax:   90.70 cm/s  PR End Diast Vel: 1.44 msec LVOT Vmean:  59.500 cm/s LVOT VTI:    0.139 m  AORTA Ao Root diam: 2.70 cm Ao Asc diam:  2.80 cm MITRAL VALVE                TRICUSPID VALVE MV Area (PHT): 5.38 cm     TR Peak grad:   29.4 mmHg MV Decel Time:  141 msec     TR Vmax:        271.00 cm/s MV E velocity: 107.00 cm/s                              SHUNTS                             Systemic VTI:  0.14 m                             Systemic Diam: 2.30 cm Jenkins Rouge MD Electronically signed by Jenkins Rouge MD Signature Date/Time: 10/24/2022/10:37:09 AM    Final    US THORACENTESIS ASP PLEURAL SPACE W/IMG GUIDE  Result Date: 10/23/2022 INDICATION: Patient with recent history of pneumonia, bilateral pleural effusions. Request is made for diagnostic and therapeutic thoracentesis. EXAM: ULTRASOUND GUIDED RIGHT THORACENTESIS MEDICATIONS: 10 mL 1% lidocaine COMPLICATIONS: None immediate. PROCEDURE: An ultrasound guided thoracentesis was thoroughly discussed with the patient and questions answered. The benefits, risks, alternatives and complications were also discussed. The patient understands and wishes to proceed with the procedure. Written consent was obtained. Ultrasound was performed to localize and mark an adequate pocket of fluid in the right chest. The area was then prepped and draped in the normal sterile fashion. 1% Lidocaine was used for local anesthesia. Under ultrasound guidance a 6 Fr Safe-T-Centesis catheter was introduced. Thoracentesis was performed. The catheter was removed and a dressing applied. FINDINGS: A total of approximately 900 mL of yellow, clear fluid was removed. Samples were sent to the laboratory as requested by the clinical team. IMPRESSION: Successful ultrasound guided right thoracentesis yielding 900 mL of pleural fluid. Read by: Brynda Greathouse PA-C Electronically Signed   By: Sandi Mariscal M.D.   On: 10/23/2022 16:30   DG Chest Port 1 View  Result Date: 10/23/2022 CLINICAL DATA:  Status post right thoracentesis EXAM: PORTABLE CHEST 1 VIEW COMPARISON:  Chest radiograph dated 10/23/2022, CT chest dated 10/20/2022 FINDINGS: Low lung volumes. Bibasilar patchy opacities. Multifocal nodules are better evaluated on prior CT. Decreased small right pleural effusion. Similar small left pleural effusion. Curvilinear  radiodensity in the mid right lung. No left pneumothorax. The heart size and mediastinal contours are within normal limits. The visualized skeletal structures are unremarkable. IMPRESSION: 1. Decreased small right pleural effusion status post thoracentesis. Curvilinear radiodensity in the mid right lung likely corresponds to fluid within the major fissure when correlated to prior chest CT. Recommend attention on follow-up. 2. Similar small left pleural effusion. Multifocal nodules are better evaluated on prior CT. Electronically Signed   By: Darrin Nipper M.D.   On: 10/23/2022 16:02   DG Chest 2 View  Result Date: 10/23/2022 CLINICAL DATA:  Pleural effusions EXAM: CHEST - 2 VIEW COMPARISON:  10/20/2022 FINDINGS: There are large bilateral pleural effusions. There are numerous pulmonary nodules. Normal pulmonary vasculature. Bibasilar consolidation or volume loss. No pneumothorax. Aorta is calcified. IMPRESSION: 1. Large pleural effusions. 2. Multiple pulmonary nodules. 3. Bibasilar volume loss/consolidation. Electronically Signed   By: Sammie Bench M.D.   On: 10/23/2022 12:53   CT Chest W Contrast  Result Date: 10/20/2022 CLINICAL DATA:  Dyspnea on exertion EXAM: CT CHEST WITH CONTRAST TECHNIQUE: Multidetector CT imaging of the chest was performed during intravenous contrast administration. RADIATION DOSE REDUCTION: This exam was performed according to the departmental dose-optimization program which includes  automated exposure control, adjustment of the mA and/or kV according to patient size and/or use of iterative reconstruction technique. CONTRAST:  61m OMNIPAQUE IOHEXOL 300 MG/ML  SOLN COMPARISON:  Chest radiograph dated 10/20/2022, CT chest dated 08/21/2020, CT abdomen and pelvis dated 06/29/2014 FINDINGS: Cardiovascular: Normal heart size. No significant pericardial fluid/thickening. Coronary artery calcifications and aortic atherosclerosis. Great vessels are normal in course and caliber. No central  pulmonary emboli. Mediastinum/Nodes: Imaged thyroid gland without nodules meeting criteria for imaging follow-up by size. Normal esophagus. Right hilar lymph node measures 15 mm. Subcarinal lymph node measures 10 mm. Lungs/Pleura: The central airways are patent. Secretions in the trachea. Bilateral lower lobe, middle lobe, and lingular consolidation. Multifocal pulmonary nodules and scattered ground-glass densities measuring up to 2.0 x 1.5 cm in the left lower lobe (7:82). No pneumothorax. Large right and moderate left pleural effusions. Upper abdomen: Unchanged 15 mm right upper pole simple cyst (2:146). No follow-up imaging recommended. Musculoskeletal: No acute or abnormal lytic or blastic osseous lesions. IMPRESSION: 1. Bilateral lower lobe, middle lobe, and lingular consolidation, suspicious for multifocal pneumonia. 2. Multifocal pulmonary nodules and scattered ground-glass densities measuring up to 2.0 x 1.5 cm in the left lower lobe, may be infectious/inflammatory, however malignancy is not excluded. Non-contrast chest CT at 3-6 months is recommended. If nodules persist, subsequent management will be based upon the most suspicious nodule(s). This recommendation follows the consensus statement: Guidelines for Management of Incidental Pulmonary Nodules Detected on CT Images: From the Fleischner Society 2017; Radiology 2017; 284:228-243. 3. Large right and moderate left pleural effusions. 4. Mediastinal and right hilar lymphadenopathy, likely reactive. 5. Coronary artery calcifications. Aortic Atherosclerosis (ICD10-I70.0). Electronically Signed   By: LDarrin NipperM.D.   On: 10/20/2022 17:55   DG Chest Port 1 View  Result Date: 10/20/2022 CLINICAL DATA:  Sepsis EXAM: PORTABLE CHEST 1 VIEW COMPARISON:  03/28/2019 x-ray. FINDINGS: Small bilateral pleural effusion with adjacent opacities. Possible infiltrate. No pneumothorax. Cardiac silhouette is obscured. No edema. IMPRESSION: Bilateral pleural effusions and  opacities. Electronically Signed   By: AJill SideM.D.   On: 10/20/2022 12:10    Microbiology: Results for orders placed or performed during the hospital encounter of 10/20/22  Resp panel by RT-PCR (RSV, Flu A&B, Covid) Anterior Nasal Swab     Status: None   Collection Time: 10/20/22  2:07 PM   Specimen: Anterior Nasal Swab  Result Value Ref Range Status   SARS Coronavirus 2 by RT PCR NEGATIVE NEGATIVE Final    Comment: (NOTE) SARS-CoV-2 target nucleic acids are NOT DETECTED.  The SARS-CoV-2 RNA is generally detectable in upper respiratory specimens during the acute phase of infection. The lowest concentration of SARS-CoV-2 viral copies this assay can detect is 138 copies/mL. A negative result does not preclude SARS-Cov-2 infection and should not be used as the sole basis for treatment or other patient management decisions. A negative result may occur with  improper specimen collection/handling, submission of specimen other than nasopharyngeal swab, presence of viral mutation(s) within the areas targeted by this assay, and inadequate number of viral copies(<138 copies/mL). A negative result must be combined with clinical observations, patient history, and epidemiological information. The expected result is Negative.  Fact Sheet for Patients:  hEntrepreneurPulse.com.au Fact Sheet for Healthcare Providers:  hIncredibleEmployment.be This test is no t yet approved or cleared by the [place] and  has been authorized for detection and/or diagnosis of SARS-CoV-2 by FDA under an Emergency Use Authorization (EUA). This EUA will remain  in effect (meaning this test can be used) for the duration of the COVID-19 declaration under Section 564(b)(1) of the Act, 21 U.S.C.section 360bbb-3(b)(1), unless the authorization is terminated  or revoked sooner.       Influenza A by PCR NEGATIVE NEGATIVE Final   Influenza B by PCR NEGATIVE NEGATIVE Final     Comment: (NOTE) The Xpert Xpress SARS-CoV-2/FLU/RSV plus assay is intended as an aid in the diagnosis of influenza from Nasopharyngeal swab specimens and should not be used as a sole basis for treatment. Nasal washings and aspirates are unacceptable for Xpert Xpress SARS-CoV-2/FLU/RSV testing.  Fact Sheet for Patients: EntrepreneurPulse.com.au  Fact Sheet for Healthcare Providers: IncredibleEmployment.be  This test is not yet approved or cleared by the [place] FDA and has been authorized for detection and/or diagnosis of SARS-CoV-2 by FDA under an Emergency Use Authorization (EUA). This EUA will remain in effect (meaning this test can be used) for the duration of the COVID-19 declaration under Section 564(b)(1) of the Act, 21 U.S.C. section 360bbb-3(b)(1), unless the authorization is terminated or revoked.     Resp Syncytial Virus by PCR NEGATIVE NEGATIVE Final    Comment: (NOTE) Fact Sheet for Patients: EntrepreneurPulse.com.au  Fact Sheet for Healthcare Providers: IncredibleEmployment.be  This test is not yet approved or cleared by the [place] FDA and has been authorized for detection and/or diagnosis of SARS-CoV-2 by FDA under an Emergency Use Authorization (EUA). This EUA will remain in effect (meaning this test can be used) for the duration of the COVID-19 declaration under Section 564(b)(1) of the Act, 21 U.S.C. section 360bbb-3(b)(1), unless the authorization is terminated or revoked.  Performed at [facility] [place] [address], [place], [place] [address]   Blood Culture (routine x 2)     Status: None   Collection Time: 10/20/22  3:05 PM   Specimen: BLOOD LEFT ARM  Result Value Ref Range Status   Specimen Description   Final    BLOOD LEFT ARM Performed at [facility], [place] [address], [place], [place] [address]    Special Requests   Final    BOTTLES  DRAWN AEROBIC AND ANAEROBIC Blood Culture adequate volume Performed at [place] [address] [place], [place] [address]    Culture   Final    NO GROWTH 5 DAYS Performed at [facility], [place] [address] [place], [place] [address]    Report Status 10/25/2022 FINAL  Final  Urine Culture     Status: None   Collection Time: 10/20/22  3:05 PM   Specimen: In/Out Cath Urine  Result Value Ref Range Status   Specimen Description   Final    IN/OUT CATH URINE Performed at [place] [address], [place], [place] [address]    Special Requests   Final    NONE Performed at [facility], [place] [address] [place], [place] [address]    Culture   Final    NO GROWTH Performed at [facility], [place] [address], [place], [place] [address]    Report Status 10/21/2022 FINAL  Final  Blood Culture (routine x 2)     Status: None   Collection Time: 10/20/22  3:19 PM   Specimen: BLOOD LEFT FOREARM  Result Value Ref Range Status   Specimen Description   Final    BLOOD LEFT FOREARM Performed at [facility], [place] [address], [place], [place] [address]    Special  Requests   Final    BOTTLES DRAWN AEROBIC AND ANAEROBIC Blood Culture adequate volume Performed at [place] [address], [place], [place] [address]    Culture   Final    NO GROWTH 5 DAYS Performed at [facility], [place] [address], [place], [place] [address]    Report Status 10/25/2022 FINAL  Final  MRSA Next Gen by PCR, Nasal     Status: None   Collection Time: 10/20/22  8:37 PM   Specimen: Nasal Mucosa; Nasal Swab  Result Value Ref Range Status   MRSA by PCR Next Gen NOT DETECTED NOT DETECTED Final    Comment: (NOTE) The GeneXpert MRSA Assay (FDA approved for NASAL specimens only), is one component of a comprehensive MRSA colonization surveillance program. It is not intended to diagnose MRSA infection nor to guide or  monitor treatment for MRSA infections. Test performance is not FDA approved in patients less than 13 years old. Performed at [facility], [place] [address] [place], [place] [address]     Labs: CBC: Recent Labs  Lab 10/24/22 0603  WBC 9.7  NEUTROABS 7.2  HGB 13.3  HCT 40.4  MCV 90.6  PLT 950   Basic Metabolic Panel: Recent Labs  Lab 10/24/22 0603 10/26/22 1108  NA 144 146*  K 3.4* 4.0  CL 110 110  CO2 23 24  GLUCOSE 103* 94  BUN 24* 28*  CREATININE 0.66 0.79  CALCIUM 8.3* 9.0  MG 2.0  --     Discharge time spent: 35 minutes.  Signed: Cordelia Poche, MD [facility] 10/28/2022

## 2022-10-28 NOTE — Hospital Course (Signed)
Michele Burns is a 87 y.o. female with a history of dementia, CAD, hyperlipidemia. Patient presented secondary to shortness of breath and cough and was found to have multifocal pneumonia.  Patient treated empirically with ceftriaxone and azithromycin with transition to Augmentin.  During admission, patient was found to have bilateral pleural effusions and associated newly diagnosed acute systolic heart failure with left ventricular EF of 25 to 30%.  Cardiology was consulted and recommended for comfort measures.  Patient transition to full comfort measures with discharge to hospice facility.

## 2022-10-28 NOTE — Progress Notes (Signed)
Patient report was called to hopice RN at 12:10pm all questions were answered and discharge paperwork was placed in package. [facility] is notified.

## 2022-10-28 NOTE — Progress Notes (Signed)
[id number] [employer] risk [employer]or [facility]) Hospital Liaison Note  Spoke with patient's son, [name], to follow up about accepting a bed at the hospice home. Eligibility has been confirmed per [facility] MD. [name] has accepted a bed at the Hospice home and is agreeable to transfer today.  TOC and hospital aware.  RN, please call report to [phone] prior to patient leaving the unit. Please send signed and completed DNR with patient at discharge.   Thank you,  Zigmund Gottron RN  [facility] Liaison [phone]

## 2022-10-28 NOTE — TOC Transition Note (Addendum)
Transition of Care [facility]) - CM/SW Discharge Note   Patient Details  Name: Michele Burns MRN: [id number] Date of Birth: 1936-08-02  Transition of Care [facility] CM/SW Contact:  Henrietta Dine, RN Phone Number: 10/28/2022, 12:17 PM   Clinical Narrative:   Notified bed available at [facility]; spoke w/ Teressa at facility; given RM# 36 and call report # [phone]; pt will be transported by [facility]; pt's son Jaylenn Baiza notified and agrees to d/c plan; awaiting d/c summary; Dr Lonny Prude notified; will call [facility] when summary ready.  1259- d/c summary received; called [facility] for transport; spoke w/ Rick; no TOC needs.  Final next level of care: [place] Barriers to Discharge: No Barriers Identified   Patient Goals and CMS Choice      Discharge Placement                  Patient to be transferred to facility by: [facility] Name of family member notified: Karen Huhta Patient and family notified of of transfer: 10/28/22  Discharge Plan and Services Additional resources added to the After Visit Summary for   In-house Referral: Hospice / Palliative Care Discharge Planning Services: CM Consult Post Acute Care Choice: Residential Hospice Bed                               Social Determinants of Health (SDOH) Interventions SDOH Screenings   Tobacco Use: Medium Risk (10/20/2022)     Readmission Risk Interventions     No data to display

## 2022-11-12 DEATH — deceased
